# Patient Record
Sex: Female | Born: 1990
Health system: Southern US, Community
[De-identification: ages and names within clinical notes are randomized; demographics above are authoritative.]

## PROBLEM LIST (undated history)

## (undated) ENCOUNTER — Inpatient Hospital Stay (HOSPITAL_COMMUNITY): Payer: Self-pay

## (undated) DIAGNOSIS — N200 Calculus of kidney: Secondary | ICD-10-CM

## (undated) DIAGNOSIS — E7800 Pure hypercholesterolemia, unspecified: Secondary | ICD-10-CM

## (undated) DIAGNOSIS — O039 Complete or unspecified spontaneous abortion without complication: Secondary | ICD-10-CM

## (undated) HISTORY — PX: NO PAST SURGERIES: SHX2092

## (undated) HISTORY — DX: Complete or unspecified spontaneous abortion without complication: O03.9

## (undated) SURGERY — Surgical Case
Anesthesia: *Unknown

---
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
## 2012-02-16 ENCOUNTER — Encounter (HOSPITAL_COMMUNITY): Payer: Self-pay | Admitting: *Deleted

## 2012-02-16 ENCOUNTER — Emergency Department (HOSPITAL_COMMUNITY): Payer: Self-pay

## 2012-02-16 ENCOUNTER — Emergency Department (HOSPITAL_COMMUNITY)
Admission: EM | Admit: 2012-02-16 | Discharge: 2012-02-16 | Disposition: A | Discharge status: Home / Self Care | Payer: Self-pay | Attending: Emergency Medicine | Admitting: Emergency Medicine

## 2012-02-16 DIAGNOSIS — E7800 Pure hypercholesterolemia, unspecified: Secondary | ICD-10-CM | POA: Insufficient documentation

## 2012-02-16 DIAGNOSIS — Y92009 Unspecified place in unspecified non-institutional (private) residence as the place of occurrence of the external cause: Secondary | ICD-10-CM | POA: Insufficient documentation

## 2012-02-16 DIAGNOSIS — X500XXA Overexertion from strenuous movement or load, initial encounter: Secondary | ICD-10-CM | POA: Insufficient documentation

## 2012-02-16 DIAGNOSIS — M25476 Effusion, unspecified foot: Secondary | ICD-10-CM | POA: Insufficient documentation

## 2012-02-16 DIAGNOSIS — M25473 Effusion, unspecified ankle: Secondary | ICD-10-CM | POA: Insufficient documentation

## 2012-02-16 DIAGNOSIS — S93409A Sprain of unspecified ligament of unspecified ankle, initial encounter: Secondary | ICD-10-CM | POA: Insufficient documentation

## 2012-02-16 DIAGNOSIS — M25579 Pain in unspecified ankle and joints of unspecified foot: Secondary | ICD-10-CM | POA: Insufficient documentation

## 2012-02-16 MED ORDER — HYDROCODONE-ACETAMINOPHEN 5-325 MG PO TABS: ORAL_TABLET | ORAL | Status: AC

## 2012-02-16 MED ORDER — IBUPROFEN 800 MG PO TABS: 800.0000 mg | ORAL_TABLET | Freq: Three times a day (TID) | ORAL | Status: AC

## 2012-02-16 NOTE — ED Notes (Signed)
Discharge instructions reviewed with pt, questions answered. Pt verbalized understanding.  

## 2012-02-16 NOTE — Discharge Instructions (Signed)
Ankle Sprain An ankle sprain is an injury to the strong, fibrous tissues (ligaments) that hold the bones of your ankle joint together.  CAUSES Ankle sprain usually is caused by a fall or by twisting your ankle. People who participate in sports are more prone to these types of injuries.  SYMPTOMS  Symptoms of ankle sprain include:  Pain in your ankle. The pain may be present at rest or only when you are trying to stand or walk.   Swelling.   Bruising. Bruising may develop immediately or within 1 to 2 days after your injury.   Difficulty standing or walking.  DIAGNOSIS  Your caregiver will ask you details about your injury and perform a physical exam of your ankle to determine if you have an ankle sprain. During the physical exam, your caregiver will press and squeeze specific areas of your foot and ankle. Your caregiver will try to move your ankle in certain ways. An X-ray exam may be done to be sure a bone was not broken or a ligament did not separate from one of the bones in your ankle (avulsion).  TREATMENT  Certain types of braces can help stabilize your ankle. Your caregiver can make a recommendation for this. Your caregiver may recommend the use of medication for pain. If your sprain is severe, your caregiver may refer you to a surgeon who helps to restore function to parts of your skeletal system (orthopedist) or a physical therapist. HOME CARE INSTRUCTIONS  Apply ice to your injury for 1 to 2 days or as directed by your caregiver. Applying ice helps to reduce inflammation and pain.  Put ice in a plastic bag.   Place a towel between your skin and the bag.   Leave the ice on for 15 to 20 minutes at a time, every 2 hours while you are awake.   Take over-the-counter or prescription medicines for pain, discomfort, or fever only as directed by your caregiver.   Keep your injured leg elevated, when possible, to lessen swelling.   If your caregiver recommends crutches, use them as  instructed. Gradually, put weight on the affected ankle. Continue to use crutches or a cane until you can walk without feeling pain in your ankle.   If you have a plaster splint, wear the splint as directed by your caregiver. Do not rest it on anything harder than a pillow the first 24 hours. Do not put weight on it. Do not get it wet. You may take it off to take a shower or bath.   You may have been given an elastic bandage to wear around your ankle to provide support. If the elastic bandage is too tight (you have numbness or tingling in your foot or your foot becomes cold and blue), adjust the bandage to make it comfortable.   If you have an air splint, you may blow more air into it or let air out to make it more comfortable. You may take your splint off at night and before taking a shower or bath.   Wiggle your toes in the splint several times per day if you are able.  SEEK MEDICAL CARE IF:   You have an increase in bruising, swelling, or pain.   Your toes feel cold.   Pain relief is not achieved with medication.  SEEK IMMEDIATE MEDICAL CARE IF: Your toes are numb or blue or you have severe pain. MAKE SURE YOU:   Understand these instructions.   Will watch your condition.     Will get help right away if you are not doing well or get worse.  Document Released: 10/06/2005 Document Revised: 09/25/2011 Document Reviewed: 05/10/2008 [employer] Patient Information 2012 [employer]. 

## 2012-02-16 NOTE — ED Provider Notes (Signed)
History     CSN: [id number]  Arrival date & time 02/16/12  1641   First MD Initiated Contact with Patient 02/16/12 1759      Chief Complaint  Patient presents with  . Fall    (Consider location/radiation/quality/duration/timing/severity/associated sxs/prior treatment) Patient is a 21 y.o. female presenting with ankle pain. The history is provided by the patient.  Ankle Pain  The incident occurred 1 to 2 hours ago. The incident occurred at home. The injury mechanism was torsion. The pain is present in the left ankle. The quality of the pain is described as aching and throbbing. The pain is moderate. The pain has been constant since onset. Pertinent negatives include no numbness, no inability to bear weight, no loss of motion, no muscle weakness, no loss of sensation and no tingling. She reports no foreign bodies present. The symptoms are aggravated by activity, bearing weight and palpation. She has tried nothing for the symptoms. The treatment provided no relief.    Past Medical History  Diagnosis Date  . High cholesterol     History reviewed. No pertinent past surgical history.  History reviewed. No pertinent family history.  History  Substance Use Topics  . Smoking status: Never Smoker   . Smokeless tobacco: Not on file  . Alcohol Use: No    OB History    Grav Para Term Preterm Abortions TAB SAB Ect Mult Living                  Review of Systems  Constitutional: Negative for fever and chills.  HENT: Negative for sore throat and trouble swallowing.   Musculoskeletal: Positive for joint swelling and arthralgias. Negative for myalgias and back pain.  Skin: Negative for color change and rash.  Neurological: Negative for tingling, weakness and numbness.  Hematological: Does not bruise/bleed easily.    Allergies  Review of patient's allergies indicates no known allergies.  Home Medications   Current Outpatient Rx  Name Route Sig Dispense Refill  . PRAVASTATIN  SODIUM 80 MG PO TABS Oral Take 80 mg by mouth at bedtime.      BP 120/74  Pulse 96  Temp(Src) 98.1 F (36.7 C) (Oral)  Resp 24  Ht 5\' 6"  (1.676 m)  Wt 180 lb (81.647 kg)  BMI 29.05 kg/m2  SpO2 100%  LMP 01/29/2012  Physical Exam  Nursing note and vitals reviewed. Constitutional: She is oriented to person, place, and time. She appears well-developed and well-nourished. No distress.  HENT:  Head: Normocephalic and atraumatic.  Cardiovascular: Normal rate, regular rhythm and normal heart sounds.   Pulmonary/Chest: Effort normal and breath sounds normal. No respiratory distress.  Musculoskeletal: Normal range of motion. She exhibits edema and tenderness.       Left ankle: She exhibits normal range of motion, no swelling, no ecchymosis, no deformity, no laceration and normal pulse. tenderness. Lateral malleolus tenderness found. No head of 5th metatarsal and no proximal fibula tenderness found. Achilles tendon normal.       Feet:  Neurological: She is alert and oriented to person, place, and time. She exhibits normal muscle tone. Coordination normal.  Skin: Skin is warm and dry.    ED Course  Procedures (including critical care time)  Labs Reviewed - No data to display Dg Ankle Complete Left  02/16/2012  *RADIOLOGY REPORT*  Clinical Data: Twisting injury left ankle.  LEFT ANKLE COMPLETE - 3+ VIEW  Comparison: 12/27/2006  Findings: The plafond and talar dome appear unremarkable.  No malleolar fracture  is observed.  No specific hindfoot or midfoot abnormality noted.  IMPRESSION:  1.  No significant abnormality identified.  Original Report Authenticated By: Dellia Cloud, M.D.     ASO applied by nursing staff.  Crutches also given.  Remains NV intact.    MDM  Previous medical charts, nursing notes and vitals signs from this visit were reviewed by me   All laboratory results and/or imaging results performed on this visit, if applicable, were reviewed by me and discussed  with the patient and/or parent as well as recommendation for follow-up    MEDICATIONS GIVEN IN ED: none  ttp of the lateral left malleolus.  No STS.  DP pulse is brisk, sensation intact.  Pt agrees to f/u with Dr. Romeo Apple if needed      PRESCRIPTIONS GIVEN AT DISCHARGE:  Ibuprofen , norco     Pt stable in ED with no significant deterioration in condition. Pt feels improved after observation and/or treatment in ED. Patient / Family / Caregiver understand and agree with initial ED impression and plan with expectations set for ED visit.  Patient agrees to return to ED for any worsening symptoms          Martie Fulgham L. [place], [place] 02/20/12 1741

## 2012-02-16 NOTE — ED Notes (Signed)
Twisted lt ankle this pm., swelling present

## 2012-02-21 NOTE — ED Provider Notes (Signed)
Medical screening examination/treatment/procedure(s) were performed by non-physician practitioner and as supervising physician I was immediately available for consultation/collaboration.  Nicoletta Dress. Colon Branch, MD 02/21/12 2322

## 2012-05-27 LAB — OB RESULTS CONSOLE RPR: RPR: NONREACTIVE

## 2012-05-27 LAB — OB RESULTS CONSOLE ABO/RH: RH Type: POSITIVE

## 2012-10-19 LAB — OB RESULTS CONSOLE GC/CHLAMYDIA: Gonorrhea: NEGATIVE

## 2012-10-19 LAB — OB RESULTS CONSOLE GBS: GBS: NEGATIVE

## 2012-10-20 NOTE — L&D Delivery Note (Signed)
Delivery Note  Ms. Wesolowski is a 22 year old G49P1001 female at 85 weeks and 6 days gestation by LMP who presented today in active labor.  She was progressed normally to 7.5 cm before being started on Pitocin at 1258.  Her position was changed and she rapidly progressed to complete with a strong urge to push.   At 1:19 PM a viable female was delivered via Vaginal, Spontaneous Delivery (Presentation: Left Occiput Anterior).  APGAR: 9, 9; weight: pending.   Placenta status: Intact, Spontaneous via Tomasa Blase.  Cord: 3 vessels with no complications.    Anesthesia: Epidural  Episiotomy: None Lacerations: 2nd degree;Perineal Suture Repair: 2.0 vicryl Est. Blood Loss (mL): 150  Mom to postpartum.  Baby skin-to-skin with mom and then to nursery-stable.  Raelyn Mora, SNM 10/27/2012, 1:43 PM Supervised by: Sid Falcon, CNM

## 2012-10-27 ENCOUNTER — Inpatient Hospital Stay (HOSPITAL_COMMUNITY): Payer: Medicaid Other | Admitting: Anesthesiology

## 2012-10-27 ENCOUNTER — Encounter (HOSPITAL_COMMUNITY): Payer: Self-pay

## 2012-10-27 ENCOUNTER — Inpatient Hospital Stay (HOSPITAL_COMMUNITY)
Admission: AD | Admit: 2012-10-27 | Discharge: 2012-10-28 | DRG: 775 | Disposition: A | Discharge status: Home / Self Care | Payer: Medicaid Other | Source: Ambulatory Visit | Attending: Obstetrics & Gynecology | Admitting: Obstetrics & Gynecology

## 2012-10-27 ENCOUNTER — Encounter (HOSPITAL_COMMUNITY): Payer: Self-pay | Admitting: Anesthesiology

## 2012-10-27 LAB — TYPE AND SCREEN
ABO/RH(D): A POS
Antibody Screen: NEGATIVE

## 2012-10-27 LAB — CBC
Hemoglobin: 11.1 g/dL — ABNORMAL LOW (ref 12.0–15.0)
MCHC: 33.5 g/dL (ref 30.0–36.0)
RDW: 13.9 % (ref 11.5–15.5)
WBC: 12.2 10*3/uL — ABNORMAL HIGH (ref 4.0–10.5)

## 2012-10-27 LAB — RPR: RPR Ser Ql: NONREACTIVE

## 2012-10-27 MED ORDER — EPHEDRINE 5 MG/ML INJ
10.0000 mg | INTRAVENOUS | Status: DC | PRN
  Filled 2012-10-27: qty 4

## 2012-10-27 MED ORDER — ONDANSETRON HCL 4 MG PO TABS: 4.0000 mg | ORAL_TABLET | ORAL | Status: DC | PRN

## 2012-10-27 MED ORDER — TERBUTALINE SULFATE 1 MG/ML IJ SOLN: 0.2500 mg | Freq: Once | INTRAMUSCULAR | Status: DC | PRN

## 2012-10-27 MED ORDER — CITRIC ACID-SODIUM CITRATE 334-500 MG/5ML PO SOLN: 30.0000 mL | ORAL | Status: DC | PRN

## 2012-10-27 MED ORDER — LANOLIN HYDROUS EX OINT: TOPICAL_OINTMENT | CUTANEOUS | Status: DC | PRN

## 2012-10-27 MED ORDER — IBUPROFEN 600 MG PO TABS
600.0000 mg | ORAL_TABLET | Freq: Four times a day (QID) | ORAL | Status: DC
  Administered 2012-10-27 – 2012-10-28 (×4): 600 mg via ORAL
  Filled 2012-10-27 (×4): qty 1

## 2012-10-27 MED ORDER — PHENYLEPHRINE 40 MCG/ML (10ML) SYRINGE FOR IV PUSH (FOR BLOOD PRESSURE SUPPORT): 80.0000 ug | PREFILLED_SYRINGE | INTRAVENOUS | Status: DC | PRN

## 2012-10-27 MED ORDER — DIPHENHYDRAMINE HCL 25 MG PO CAPS: 25.0000 mg | ORAL_CAPSULE | Freq: Four times a day (QID) | ORAL | Status: DC | PRN

## 2012-10-27 MED ORDER — OXYTOCIN BOLUS FROM INFUSION: 500.0000 mL | INTRAVENOUS | Status: DC

## 2012-10-27 MED ORDER — OXYCODONE-ACETAMINOPHEN 5-325 MG PO TABS: 1.0000 | ORAL_TABLET | ORAL | Status: DC | PRN

## 2012-10-27 MED ORDER — ONDANSETRON HCL 4 MG/2ML IJ SOLN: 4.0000 mg | Freq: Four times a day (QID) | INTRAMUSCULAR | Status: DC | PRN

## 2012-10-27 MED ORDER — SENNOSIDES-DOCUSATE SODIUM 8.6-50 MG PO TABS
2.0000 | ORAL_TABLET | Freq: Every day | ORAL | Status: DC
  Administered 2012-10-27: 2 via ORAL

## 2012-10-27 MED ORDER — LACTATED RINGERS IV SOLN: 500.0000 mL | INTRAVENOUS | Status: DC | PRN

## 2012-10-27 MED ORDER — ONDANSETRON HCL 4 MG/2ML IJ SOLN: 4.0000 mg | INTRAMUSCULAR | Status: DC | PRN

## 2012-10-27 MED ORDER — FENTANYL 2.5 MCG/ML BUPIVACAINE 1/10 % EPIDURAL INFUSION (WH - ANES)
14.0000 mL/h | INTRAMUSCULAR | Status: DC
  Administered 2012-10-27: 14 mL/h via EPIDURAL
  Filled 2012-10-27: qty 125

## 2012-10-27 MED ORDER — LACTATED RINGERS IV SOLN
INTRAVENOUS | Status: DC
  Administered 2012-10-27: 125 mL/h via INTRAVENOUS

## 2012-10-27 MED ORDER — LIDOCAINE HCL (PF) 1 % IJ SOLN
INTRAMUSCULAR | Status: DC | PRN
  Administered 2012-10-27 (×2): 5 mL

## 2012-10-27 MED ORDER — WITCH HAZEL-GLYCERIN EX PADS: 1.0000 "application " | MEDICATED_PAD | CUTANEOUS | Status: DC | PRN

## 2012-10-27 MED ORDER — EPHEDRINE 5 MG/ML INJ: 10.0000 mg | INTRAVENOUS | Status: DC | PRN

## 2012-10-27 MED ORDER — LIDOCAINE HCL (PF) 1 % IJ SOLN
30.0000 mL | INTRAMUSCULAR | Status: DC | PRN
  Filled 2012-10-27: qty 30

## 2012-10-27 MED ORDER — BENZOCAINE-MENTHOL 20-0.5 % EX AERO
1.0000 "application " | INHALATION_SPRAY | CUTANEOUS | Status: DC | PRN
  Administered 2012-10-27: 1 via TOPICAL
  Filled 2012-10-27: qty 56

## 2012-10-27 MED ORDER — ACETAMINOPHEN 325 MG PO TABS: 650.0000 mg | ORAL_TABLET | ORAL | Status: DC | PRN

## 2012-10-27 MED ORDER — TETANUS-DIPHTH-ACELL PERTUSSIS 5-2.5-18.5 LF-MCG/0.5 IM SUSP: 0.5000 mL | Freq: Once | INTRAMUSCULAR | Status: DC

## 2012-10-27 MED ORDER — ZOLPIDEM TARTRATE 5 MG PO TABS: 5.0000 mg | ORAL_TABLET | Freq: Every evening | ORAL | Status: DC | PRN

## 2012-10-27 MED ORDER — SIMETHICONE 80 MG PO CHEW: 80.0000 mg | CHEWABLE_TABLET | ORAL | Status: DC | PRN

## 2012-10-27 MED ORDER — OXYTOCIN 40 UNITS IN LACTATED RINGERS INFUSION - SIMPLE MED
1.0000 m[IU]/min | INTRAVENOUS | Status: DC
  Administered 2012-10-27: 2 m[IU]/min via INTRAVENOUS

## 2012-10-27 MED ORDER — DIBUCAINE 1 % RE OINT: 1.0000 "application " | TOPICAL_OINTMENT | RECTAL | Status: DC | PRN

## 2012-10-27 MED ORDER — LACTATED RINGERS IV SOLN
500.0000 mL | Freq: Once | INTRAVENOUS | Status: AC
  Administered 2012-10-27: 500 mL via INTRAVENOUS

## 2012-10-27 MED ORDER — IBUPROFEN 600 MG PO TABS: 600.0000 mg | ORAL_TABLET | Freq: Four times a day (QID) | ORAL | Status: DC | PRN

## 2012-10-27 MED ORDER — OXYTOCIN 40 UNITS IN LACTATED RINGERS INFUSION - SIMPLE MED
62.5000 mL/h | INTRAVENOUS | Status: DC
  Administered 2012-10-27: 62.5 mL/h via INTRAVENOUS
  Filled 2012-10-27: qty 1000

## 2012-10-27 MED ORDER — PHENYLEPHRINE 40 MCG/ML (10ML) SYRINGE FOR IV PUSH (FOR BLOOD PRESSURE SUPPORT)
80.0000 ug | PREFILLED_SYRINGE | INTRAVENOUS | Status: DC | PRN
  Filled 2012-10-27: qty 5

## 2012-10-27 MED ORDER — DIPHENHYDRAMINE HCL 50 MG/ML IJ SOLN: 12.5000 mg | INTRAMUSCULAR | Status: DC | PRN

## 2012-10-27 MED ORDER — PRENATAL MULTIVITAMIN CH
1.0000 | ORAL_TABLET | Freq: Every day | ORAL | Status: DC
  Administered 2012-10-27: 1 via ORAL
  Filled 2012-10-27 (×2): qty 1

## 2012-10-27 NOTE — MAU Note (Signed)
Pt states contractions started around 9pm and that they have progressively gotten stronger. States that they are now every 2-3 minutes apart

## 2012-10-27 NOTE — Progress Notes (Signed)
   Subjective: Pt is comfortable after epidural.  No questions or concerns.  Consents to AROM.  Objective: BP 104/73  Pulse 119  Temp 97.6 F (36.4 C) (Oral)  Resp 20  Ht 5\' 4"  (1.626 m)  Wt 90.266 kg (199 lb)  BMI 34.16 kg/m2  SpO2 97%  LMP 01/29/2012      FHT:  FHR: 155 bpm, variability: moderate,  accelerations:  Present,  decelerations:  Present early decels. UC:   regular, every 2-3 minutes SVE:   Dilation: 7 Effacement (%): 100 Station: 0 Exam by:: Maycee Blasco,CNM  Labs: Lab Results  Component Value Date   WBC 12.2* 10/27/2012   HGB 11.1* 10/27/2012   HCT 33.1* 10/27/2012   MCV 83.4 10/27/2012   PLT 240 10/27/2012    Assessment / Plan: Spontaneous labor, progressing normally  Labor: Progressing normally Preeclampsia:  n/a Fetal Wellbeing:  Category I Pain Control:  Epidural I/D:  n/a Anticipated MOD:  NSVD  [facility] 10/27/2012, 10:50 AM

## 2012-10-27 NOTE — Anesthesia Procedure Notes (Signed)
Epidural Patient location during procedure: OB Start time: 10/27/2012 8:59 AM  Staffing Anesthesiologist: Angus Seller., Harrell Gave. Performed by: anesthesiologist   Preanesthetic Checklist Completed: patient identified, site marked, surgical consent, pre-op evaluation, timeout performed, IV checked, risks and benefits discussed and monitors and equipment checked  Epidural Patient position: sitting Prep: site prepped and draped and DuraPrep Patient monitoring: continuous pulse ox and blood pressure Approach: midline Injection technique: LOR air and LOR saline  Needle:  Needle type: Tuohy  Needle gauge: 17 G Needle length: 9 cm and 9 Needle insertion depth: 5 cm cm Catheter type: closed end flexible Catheter size: 19 Gauge Catheter at skin depth: 10 cm Test dose: negative  Assessment Events: blood not aspirated, injection not painful, no injection resistance, negative IV test and no paresthesia  Additional Notes Patient identified.  Risk benefits discussed including failed block, incomplete pain control, headache, nerve damage, paralysis, blood pressure changes, nausea, vomiting, reactions to medication both toxic or allergic, and postpartum back pain.  Patient expressed understanding and wished to proceed.  All questions were answered.  Sterile technique used throughout procedure and epidural site dressed with sterile barrier dressing. No paresthesia or other complications noted.The patient did not experience any signs of intravascular injection such as tinnitus or metallic taste in mouth nor signs of intrathecal spread such as rapid motor block. Please see nursing notes for vital signs.

## 2012-10-27 NOTE — H&P (Signed)
Attestation of Attending Supervision of Advanced Practitioner (CNM/NP): Evaluation and management procedures were performed by the Advanced Practitioner under my supervision and collaboration.  I have reviewed the Advanced Practitioner's note and chart, and I agree with the management and plan.  Ellionna Buckbee 10/27/2012 12:58 PM

## 2012-10-27 NOTE — Anesthesia Preprocedure Evaluation (Signed)

## 2012-10-27 NOTE — H&P (Signed)
Jessica Ward is a 22 y.o. female presenting for contractions. Maternal Medical History:  Reason for admission: Reason for admission: contractions.  Contractions: Onset was 6-12 hours ago.   Frequency: regular.    Fetal activity: Perceived fetal activity is normal.   Last perceived fetal movement was within the past hour.    Prenatal complications: no prenatal complications   OB History    Grav Para Term Preterm Abortions TAB SAB Ect Mult Living   2 1 1       1      Past Medical History  Diagnosis Date  . High cholesterol    Past Surgical History  Procedure Date  . No past surgeries    Family History: family history is not on file. Social History:  reports that she has never smoked. She does not have any smokeless tobacco history on file. She reports that she does not drink alcohol or use illicit drugs.   Prenatal Transfer Tool  Maternal Diabetes: No Genetic Screening: Declined Maternal Ultrasounds/Referrals: Normal Fetal Ultrasounds or other Referrals:  None Maternal Substance Abuse:  No Significant Maternal Medications:  None Significant Maternal Lab Results:  Lab values include: Group B Strep negative Other Comments:  None  Review of Systems  Gastrointestinal: Positive for abdominal pain.  All other systems reviewed and are negative.    Dilation: 7 Effacement (%): 100 Station: 0 Exam by:: walidah,CNM Blood pressure 115/55, pulse 104, temperature 97.6 F (36.4 C), temperature source Oral, resp. rate 20, height 5\' 4"  (1.626 m), weight 90.266 kg (199 lb), last menstrual period 01/29/2012, SpO2 97.00%. Maternal Exam:  Uterine Assessment: Contraction strength is firm.  Contraction frequency is irregular.   Abdomen: Estimated fetal weight is 7.5-8lbs.   Fetal presentation: vertex  Introitus: Normal vulva. Normal vagina.  Vaginal discharge: mucusy.    Fetal Exam Fetal Monitor Review: Baseline rate: 155.  Variability: moderate (6-25 bpm).   Pattern:  accelerations present and early decelerations.    Fetal State Assessment: Category I - tracings are normal.     Physical Exam  Constitutional: She is oriented to person, place, and time. She appears well-developed and well-nourished. No distress.  HENT:  Head: Normocephalic.  Neck: Normal range of motion. Neck supple.  Cardiovascular: Normal rate, regular rhythm and normal heart sounds.   Respiratory: Effort normal and breath sounds normal.  GI: Soft. There is no tenderness.  Genitourinary: No bleeding around the vagina. Vaginal discharge: mucusy.  Musculoskeletal: Normal range of motion. She exhibits edema (trace pedal bilat).  Neurological: She is alert and oriented to person, place, and time.  Skin: Skin is warm and dry.    Prenatal labs: ABO, Rh: A/Positive/-- (08/08 0000) Antibody: Negative (08/08 0000) Rubella: Immune (08/08 0000) RPR: Nonreactive (08/08 0000)  HBsAg: Negative (08/08 0000)  HIV: Non-reactive (08/08 0000)  GBS: Negative (12/31 0000)   Assessment/Plan: Active Labor GBS neg   Admit to [facility] Suit[facility] AROM > clear Anticipate NSVD  [facility] 10/27/2012, 10:46 AM

## 2012-10-28 LAB — CBC
HCT: 29.1 % — ABNORMAL LOW (ref 36.0–46.0)
Hemoglobin: 9.6 g/dL — ABNORMAL LOW (ref 12.0–15.0)
MCH: 27.8 pg (ref 26.0–34.0)
MCV: 84.3 fL (ref 78.0–100.0)
RBC: 3.45 MIL/uL — ABNORMAL LOW (ref 3.87–5.11)
WBC: 8.8 10*3/uL (ref 4.0–10.5)

## 2012-10-28 MED ORDER — IBUPROFEN 600 MG PO TABS: 600.0000 mg | ORAL_TABLET | Freq: Four times a day (QID) | ORAL | Status: DC

## 2012-10-28 NOTE — Progress Notes (Signed)
UR chart review completed.  

## 2012-10-28 NOTE — Anesthesia Postprocedure Evaluation (Signed)
  Anesthesia Post-op Note  Patient: Jessica Ward  Procedure(s) Performed: * No procedures listed *  Patient Location: Mother/Baby  Anesthesia Type:Epidural  Level of Consciousness: awake, alert  and oriented  Airway and Oxygen Therapy: Patient Spontanous Breathing  Post-op Pain: none  Post-op Assessment: Post-op Vital signs reviewed and Patient's Cardiovascular Status Stable  Post-op Vital Signs: Reviewed and stable  Complications: No apparent anesthesia complications

## 2012-10-28 NOTE — Discharge Summary (Signed)
Obstetric Discharge Summary Reason for Admission: onset of labor Prenatal Procedures: ultrasound Intrapartum Procedures: spontaneous vaginal delivery Postpartum Procedures: none Complications-Operative and Postpartum: 2nd degree perineal laceration Hemoglobin  Date Value Range Status  10/28/2012 9.6* 12.0 - 15.0 g/dL Final     HCT  Date Value Range Status  10/28/2012 29.1* 36.0 - 46.0 % Final    Physical Exam:  General: alert, cooperative and no distress Lochia: appropriate Uterine Fundus: firm No hematoma at perineum DVT Evaluation: No evidence of DVT seen on physical exam. No cords or calf tenderness. No significant calf/ankle edema.  Discharge Diagnoses: Term Pregnancy-delivered  Discharge Information: Date: 10/28/2012 Activity: pelvic rest Diet: routine Medications: Ibuprofen Condition: stable Instructions: refer to practice specific booklet Discharge to: home   Newborn Data: Live born female  Birth Weight: 7 lb 0.4 oz (3185 g) APGAR: 9, 9  Home with mother.  Jessica Ward 10/28/2012, 8:43 AM

## 2012-10-28 NOTE — Discharge Summary (Signed)
Agree with above note.  Jessica Carranco H. 10/28/2012 4:48 PM

## 2013-01-19 ENCOUNTER — Encounter: Payer: Self-pay | Admitting: *Deleted

## 2013-01-20 ENCOUNTER — Ambulatory Visit: Payer: Self-pay | Admitting: Advanced Practice Midwife

## 2013-09-29 ENCOUNTER — Ambulatory Visit (INDEPENDENT_AMBULATORY_CARE_PROVIDER_SITE_OTHER): Payer: Self-pay | Admitting: Obstetrics & Gynecology

## 2013-09-29 ENCOUNTER — Encounter: Payer: Self-pay | Admitting: Obstetrics & Gynecology

## 2013-09-29 ENCOUNTER — Encounter (INDEPENDENT_AMBULATORY_CARE_PROVIDER_SITE_OTHER): Payer: Self-pay

## 2013-09-29 VITALS — BP 110/78 | Ht 66.0 in | Wt 203.0 lb

## 2013-09-29 DIAGNOSIS — R102 Pelvic and perineal pain: Secondary | ICD-10-CM

## 2013-09-29 DIAGNOSIS — N949 Unspecified condition associated with female genital organs and menstrual cycle: Secondary | ICD-10-CM

## 2013-09-29 MED ORDER — CIPROFLOXACIN HCL 500 MG PO TABS: 500.0000 mg | ORAL_TABLET | Freq: Two times a day (BID) | ORAL | Status: DC

## 2013-09-29 NOTE — Progress Notes (Signed)
Patient ID: Jessica Ward, female   DOB: 1991/06/29, 22 y.o.   MRN: [id number] Pt with cyclical not related to menses pelvic cramps Severe makes nauseated Pain with sex the next day  Exam No cmt Uterus not tender adnexa normal  Imp cylcical pelvic pain related to iud  Plan Doxycycline too expensive cipro x 2 weeks  folllow up if needed

## 2013-10-25 ENCOUNTER — Ambulatory Visit: Payer: Self-pay | Admitting: Obstetrics & Gynecology

## 2013-11-02 ENCOUNTER — Ambulatory Visit: Payer: Self-pay | Admitting: Obstetrics & Gynecology

## 2013-12-07 ENCOUNTER — Ambulatory Visit: Payer: BC Managed Care – PPO | Admitting: Advanced Practice Midwife

## 2013-12-14 ENCOUNTER — Ambulatory Visit: Payer: BC Managed Care – PPO | Admitting: Adult Health

## 2014-02-03 ENCOUNTER — Encounter: Payer: Self-pay | Admitting: *Deleted

## 2014-02-03 ENCOUNTER — Ambulatory Visit: Payer: BC Managed Care – PPO | Admitting: Adult Health

## 2014-08-21 ENCOUNTER — Encounter: Payer: Self-pay | Admitting: Obstetrics & Gynecology

## 2014-08-22 ENCOUNTER — Encounter: Payer: Self-pay | Admitting: Advanced Practice Midwife

## 2014-08-22 ENCOUNTER — Ambulatory Visit (INDEPENDENT_AMBULATORY_CARE_PROVIDER_SITE_OTHER): Payer: BC Managed Care – PPO | Admitting: Advanced Practice Midwife

## 2014-08-22 VITALS — BP 108/60 | Ht 66.0 in | Wt 208.0 lb

## 2014-08-22 DIAGNOSIS — Z3009 Encounter for other general counseling and advice on contraception: Secondary | ICD-10-CM | POA: Insufficient documentation

## 2014-08-22 DIAGNOSIS — Z3200 Encounter for pregnancy test, result unknown: Secondary | ICD-10-CM

## 2014-08-22 DIAGNOSIS — Z30432 Encounter for removal of intrauterine contraceptive device: Secondary | ICD-10-CM

## 2014-08-22 DIAGNOSIS — Z3202 Encounter for pregnancy test, result negative: Secondary | ICD-10-CM

## 2014-08-22 LAB — POCT URINE PREGNANCY: PREG TEST UR: NEGATIVE

## 2014-08-22 MED ORDER — NORGESTIMATE-ETH ESTRADIOL 0.25-35 MG-MCG PO TABS: 1.0000 | ORAL_TABLET | Freq: Every day | ORAL | Status: DC

## 2014-08-22 NOTE — Progress Notes (Signed)
  Jessica Ward 23 y.o.  Filed Vitals:   08/22/14 0952  BP: 108/60   Past Medical History  Diagnosis Date  . High cholesterol   . Normal delivery 10/27/2012   Past Surgical History  Procedure Laterality Date  . No past surgeries     family history includes Cancer in her other; Coronary artery disease in her maternal grandfather; Diabetes in her maternal grandfather; Hypertension in her mother. Current outpatient prescriptions: ciprofloxacin (CIPRO) 500 MG tablet, Take 1 tablet (500 mg total) by mouth 2 (two) times daily., Disp: 28 tablet, Rfl: 0;  norgestimate-ethinyl estradiol (ORTHO-CYCLEN,SPRINTEC,PREVIFEM) 0.25-35 MG-MCG tablet, Take 1 tablet by mouth daily., Disp: 1 Package, Rfl: 11;  pantoprazole (PROTONIX) 20 MG tablet, Take 20 mg by mouth daily., Disp: , Rfl:  pravastatin (PRAVACHOL) 80 MG tablet, Take 80 mg by mouth at bedtime., Disp: , Rfl:     Here for IUD removal.  She had the Mirena IUD placed 3 years ago and would like it removed because she feels like it gives her infections, sometimes painful. Her plans for future contraception are COC.  A graves speculum was placed, and the strings were not visible.  They were blindly grasped with a curved Tresa EndoKelly and the IUD easily removed.  Pt given IUD removal f/u instructions.

## 2014-08-22 NOTE — Patient Instructions (Signed)

## 2014-09-19 ENCOUNTER — Telehealth: Payer: Self-pay | Admitting: Adult Health

## 2014-09-19 NOTE — Telephone Encounter (Signed)
Mom called pt can't afford OCs, give me a call can get some samples, she and husband split up

## 2014-10-20 NOTE — L&D Delivery Note (Signed)
Delivery Note Charlsie Merlesaylor Ziff is a 24 y.o. female G3P2002 with an uncomplicated IUP at 4560w0d who presented on 08/14/2015 in latent labor. She was admitted to the Labor & Delivery unit. An epidural was placed at the patient's request. She progressed to active labor without issue with augmentation by pitocin. At 1553 she delivered a healthy baby boy via uncomplicated spontaneous vaginal delivery (LOA). Cord clamping was delayed and third stage of labor proceeded as expected with spontaneous delivery of the placenta and subsequent hemostasis.  APGAR (1 MIN): 9   APGAR (5 MINS): 9   Weight: 7 lb 9.3 oz Placenta status: SROM Cord: 3 vessel Complications: none  Anesthesia: Epidural  Episiotomy: None Lacerations: 1st degree perineal, midline Suture Repair: 3.0 vicryl Est. Blood Loss (mL): 148 mL   Mom to postpartum.  Baby to Couplet care / Skin to Skin.  Gerri Sporeaitlin Clennon Nasca, MD 08/14/2015 4:46 PM

## 2014-12-22 ENCOUNTER — Encounter: Payer: Self-pay | Admitting: Adult Health

## 2014-12-22 ENCOUNTER — Ambulatory Visit (INDEPENDENT_AMBULATORY_CARE_PROVIDER_SITE_OTHER): Payer: BLUE CROSS/BLUE SHIELD | Admitting: Adult Health

## 2014-12-22 VITALS — BP 104/60 | HR 102 | Ht 66.0 in | Wt 206.5 lb

## 2014-12-22 DIAGNOSIS — Z3201 Encounter for pregnancy test, result positive: Secondary | ICD-10-CM

## 2014-12-22 DIAGNOSIS — Z3490 Encounter for supervision of normal pregnancy, unspecified, unspecified trimester: Secondary | ICD-10-CM

## 2014-12-22 LAB — POCT URINE PREGNANCY: Preg Test, Ur: POSITIVE

## 2014-12-22 NOTE — Progress Notes (Signed)
Subjective:     Patient ID: Jessica Ward, female   DOB: 07-25-1991, 24 y.o.   MRN: [id number]  HPI Jessica Ward is a 24 year old white female, she had IUD removed in November and missed a period this months and had 2+HPT and wants UPT now.No bleeding or nausea or vomiting.  Review of Systems +missed period with +UPT, all other systems negative Reviewed past medical,surgical, social and family history. Reviewed medications and allergies.     Objective:   Physical Exam BP 104/60 mmHg  Pulse 102  Ht 5\' 6"  (1.676 m)  Wt 206 lb 8 oz (93.668 kg)  BMI 33.35 kg/m2  LMP 11/21/2013 UPT+, about 4+2 weeks with EDD 08/30/15 by LMP, medicaid form given, she says she wants tubes tied.Skin warm and dry, abdomen soft, non tender, no HSM.    Assessment:     Pregnant +UPT    Plan:     Take OTC prenatals or flintstones Return 3/24 for dating US Review handout on first trimester

## 2014-12-22 NOTE — Patient Instructions (Signed)
First Trimester of Pregnancy The first trimester of pregnancy is from week 1 until the end of week 12 (months 1 through 3). A week after a sperm fertilizes an egg, the egg will implant on the wall of the uterus. This embryo will begin to develop into a baby. Genes from you and your partner are forming the baby. The female genes determine whether the baby is a boy or a girl. At 6-8 weeks, the eyes and face are formed, and the heartbeat can be seen on ultrasound. At the end of 12 weeks, all the baby's organs are formed.  Now that you are pregnant, you will want to do everything you can to have a healthy baby. Two of the most important things are to get good prenatal care and to follow your health care provider's instructions. Prenatal care is all the medical care you receive before the baby's birth. This care will help prevent, find, and treat any problems during the pregnancy and childbirth. BODY CHANGES Your body goes through many changes during pregnancy. The changes vary from woman to woman.   You may gain or lose a couple of pounds at first.  You may feel sick to your stomach (nauseous) and throw up (vomit). If the vomiting is uncontrollable, call your health care provider.  You may tire easily.  You may develop headaches that can be relieved by medicines approved by your health care provider.  You may urinate more often. Painful urination may mean you have a bladder infection.  You may develop heartburn as a result of your pregnancy.  You may develop constipation because certain hormones are causing the muscles that push waste through your intestines to slow down.  You may develop hemorrhoids or swollen, bulging veins (varicose veins).  Your breasts may begin to grow larger and become tender. Your nipples may stick out more, and the tissue that surrounds them (areola) may become darker.  Your gums may bleed and may be sensitive to brushing and flossing.  Dark spots or blotches (chloasma,  mask of pregnancy) may develop on your face. This will likely fade after the baby is born.  Your menstrual periods will stop.  You may have a loss of appetite.  You may develop cravings for certain kinds of food.  You may have changes in your emotions from day to day, such as being excited to be pregnant or being concerned that something may go wrong with the pregnancy and baby.  You may have more vivid and strange dreams.  You may have changes in your hair. These can include thickening of your hair, rapid growth, and changes in texture. Some women also have hair loss during or after pregnancy, or hair that feels dry or thin. Your hair will most likely return to normal after your baby is born. WHAT TO EXPECT AT YOUR PRENATAL VISITS During a routine prenatal visit:  You will be weighed to make sure you and the baby are growing normally.  Your blood pressure will be taken.  Your abdomen will be measured to track your baby's growth.  The fetal heartbeat will be listened to starting around week 10 or 12 of your pregnancy.  Test results from any previous visits will be discussed. Your health care provider may ask you:  How you are feeling.  If you are feeling the baby move.  If you have had any abnormal symptoms, such as leaking fluid, bleeding, severe headaches, or abdominal cramping.  If you have any questions. Other tests   that may be performed during your first trimester include:  Blood tests to find your blood type and to check for the presence of any previous infections. They will also be used to check for low iron levels (anemia) and Rh antibodies. Later in the pregnancy, blood tests for diabetes will be done along with other tests if problems develop.  Urine tests to check for infections, diabetes, or protein in the urine.  An ultrasound to confirm the proper growth and development of the baby.  An amniocentesis to check for possible genetic problems.  Fetal screens for  spina bifida and Down syndrome.  You may need other tests to make sure you and the baby are doing well. HOME CARE INSTRUCTIONS  Medicines  Follow your health care provider's instructions regarding medicine use. Specific medicines may be either safe or unsafe to take during pregnancy.  Take your prenatal vitamins as directed.  If you develop constipation, try taking a stool softener if your health care provider approves. Diet  Eat regular, well-balanced meals. Choose a variety of foods, such as meat or vegetable-based protein, fish, milk and low-fat dairy products, vegetables, fruits, and whole grain breads and cereals. Your health care provider will help you determine the amount of weight gain that is right for you.  Avoid raw meat and uncooked cheese. These carry germs that can cause birth defects in the baby.  Eating four or five small meals rather than three large meals a day may help relieve nausea and vomiting. If you start to feel nauseous, eating a few soda crackers can be helpful. Drinking liquids between meals instead of during meals also seems to help nausea and vomiting.  If you develop constipation, eat more high-fiber foods, such as fresh vegetables or fruit and whole grains. Drink enough fluids to keep your urine clear or pale yellow. Activity and Exercise  Exercise only as directed by your health care provider. Exercising will help you:  Control your weight.  Stay in shape.  Be prepared for labor and delivery.  Experiencing pain or cramping in the lower abdomen or low back is a good sign that you should stop exercising. Check with your health care provider before continuing normal exercises.  Try to avoid standing for long periods of time. Move your legs often if you must stand in one place for a long time.  Avoid heavy lifting.  Wear low-heeled shoes, and practice good posture.  You may continue to have sex unless your health care provider directs you  otherwise. Relief of Pain or Discomfort  Wear a good support bra for breast tenderness.   Take warm sitz baths to soothe any pain or discomfort caused by hemorrhoids. Use hemorrhoid cream if your health care provider approves.   Rest with your legs elevated if you have leg cramps or low back pain.  If you develop varicose veins in your legs, wear support hose. Elevate your feet for 15 minutes, 3-4 times a day. Limit salt in your diet. Prenatal Care  Schedule your prenatal visits by the twelfth week of pregnancy. They are usually scheduled monthly at first, then more often in the last 2 months before delivery.  Write down your questions. Take them to your prenatal visits.  Keep all your prenatal visits as directed by your health care provider. Safety  Wear your seat belt at all times when driving.  Make a list of emergency phone numbers, including numbers for family, friends, the hospital, and police and fire departments. General Tips    Ask your health care provider for a referral to a local prenatal education class. Begin classes no later than at the beginning of month 6 of your pregnancy.  Ask for help if you have counseling or nutritional needs during pregnancy. Your health care provider can offer advice or refer you to specialists for help with various needs.  Do not use hot tubs, steam rooms, or saunas.  Do not douche or use tampons or scented sanitary pads.  Do not cross your legs for long periods of time.  Avoid cat litter boxes and soil used by cats. These carry germs that can cause birth defects in the baby and possibly loss of the fetus by miscarriage or stillbirth.  Avoid all smoking, herbs, alcohol, and medicines not prescribed by your health care provider. Chemicals in these affect the formation and growth of the baby.  Schedule a dentist appointment. At home, brush your teeth with a soft toothbrush and be gentle when you floss. SEEK MEDICAL CARE IF:   You have  dizziness.  You have mild pelvic cramps, pelvic pressure, or nagging pain in the abdominal area.  You have persistent nausea, vomiting, or diarrhea.  You have a bad smelling vaginal discharge.  You have pain with urination.  You notice increased swelling in your face, hands, legs, or ankles. SEEK IMMEDIATE MEDICAL CARE IF:   You have a fever.  You are leaking fluid from your vagina.  You have spotting or bleeding from your vagina.  You have severe abdominal cramping or pain.  You have rapid weight gain or loss.  You vomit blood or material that looks like coffee grounds.  You are exposed to [place] measles and have never had them.  You are exposed to fifth disease or chickenpox.  You develop a severe headache.  You have shortness of breath.  You have any kind of trauma, such as from a fall or a car accident. Document Released: 09/30/2001 Document Revised: 02/20/2014 Document Reviewed: 08/16/2013 [facility] Patient Information 2015 [employer] [place]. This information is not intended to replace advice given to you by your health care provider. Make sure you discuss any questions you have with your health care provider. Return 3/24 for dating [place]

## 2015-01-01 ENCOUNTER — Telehealth: Payer: Self-pay | Admitting: *Deleted

## 2015-01-01 NOTE — Telephone Encounter (Signed)
Pt c/o of headaches and migraine causing nausea/vomiting. Pt states tylenol is not helping. Call transferred to front staff for soonest available appt.

## 2015-01-02 ENCOUNTER — Encounter: Payer: Self-pay | Admitting: Obstetrics & Gynecology

## 2015-01-02 ENCOUNTER — Encounter: Payer: Medicaid Other | Admitting: Obstetrics & Gynecology

## 2015-01-02 ENCOUNTER — Ambulatory Visit (INDEPENDENT_AMBULATORY_CARE_PROVIDER_SITE_OTHER): Payer: BLUE CROSS/BLUE SHIELD | Admitting: Obstetrics & Gynecology

## 2015-01-02 VITALS — BP 120/80 | HR 88 | Wt 202.0 lb

## 2015-01-02 DIAGNOSIS — M542 Cervicalgia: Secondary | ICD-10-CM | POA: Diagnosis not present

## 2015-01-02 DIAGNOSIS — G44209 Tension-type headache, unspecified, not intractable: Secondary | ICD-10-CM

## 2015-01-02 NOTE — Progress Notes (Signed)
This encounter was created in error - please disregard.

## 2015-01-02 NOTE — Progress Notes (Signed)
Patient ID: Jessica Ward, female   DOB: 02-Jan-1991, 24 y.o.   MRN: [id number] Pt has been having severe bilateral top of head headaches with some radiation down neck and into shoulders Has had 1 episode of unilateral with nausea, but mostly top of head type  Blood pressure 120/80, pulse 88, weight 202 lb (91.627 kg), last menstrual period 11/21/2013.  Exam +triggers in neck bilaterally r>L involving lateral trapezius as well When the triggers are found the pain radiates into the head in distribution of the headached  Impression Myofascial neck pain, trigger points causing headache Early pregnancy unconfirmed status, appointment pending  Plan Injection of trigger points  Trigger Point Injection   Pre-operative diagnosis: myofascial pain of trapezius bilateral and headache  Post-operative diagnosis: same  After risks and benefits were explained including bleeding, infection, worsening of the pain, damage to the area being injected, weakness, allergic reaction to medications, vascular injection, and nerve damage, signed consent was obtained.  All questions were answered.    The area of the trigger point was identified and the skin prepped three times with alcohol and the alcohol allowed to dry.  Next, a 25 gauge 0.5 inch needle was placed in the area of the trigger point.  Once reproduction of the pain was elicited and negative aspiration confirmed, the trigger point was injected and the needle removed.    The patient did tolerate the procedure well and there were not complications.    Medication used:  20 cc 0.5% marcaine was injected total into about 14 spots bilaterally    Trigger points injected: 14    Trigger point(s) location(s):  bilateral   Follow up 1 week

## 2015-01-09 ENCOUNTER — Ambulatory Visit: Payer: Medicaid Other | Admitting: Obstetrics & Gynecology

## 2015-01-10 ENCOUNTER — Ambulatory Visit (INDEPENDENT_AMBULATORY_CARE_PROVIDER_SITE_OTHER): Payer: BLUE CROSS/BLUE SHIELD

## 2015-01-10 DIAGNOSIS — O3680X0 Pregnancy with inconclusive fetal viability, not applicable or unspecified: Secondary | ICD-10-CM

## 2015-01-10 DIAGNOSIS — Z3490 Encounter for supervision of normal pregnancy, unspecified, unspecified trimester: Secondary | ICD-10-CM

## 2015-01-10 NOTE — Progress Notes (Signed)
US 4746w1d EDD 08/21/2015, IUP pos fht 171 bpm,bilat adnexa wnl,cx appears closed

## 2015-01-12 ENCOUNTER — Other Ambulatory Visit: Payer: Self-pay

## 2015-01-30 ENCOUNTER — Other Ambulatory Visit: Payer: Self-pay | Admitting: Advanced Practice Midwife

## 2015-01-30 ENCOUNTER — Telehealth: Payer: Self-pay | Admitting: Advanced Practice Midwife

## 2015-01-30 ENCOUNTER — Other Ambulatory Visit: Payer: Self-pay | Admitting: *Deleted

## 2015-01-30 MED ORDER — DOXYLAMINE-PYRIDOXINE 10-10 MG PO TBEC: DELAYED_RELEASE_TABLET | ORAL | Status: DC

## 2015-01-30 NOTE — Telephone Encounter (Signed)
Yes give her 2 samples and I called it in to pharmacy

## 2015-01-30 NOTE — Progress Notes (Unsigned)
Diclegis samples given and rx Alysia Penna/prior auth sent to pharmacy.

## 2015-01-30 NOTE — Telephone Encounter (Signed)
Pt informed she can come pick up samples from front staff and that RX was sent to her pharmacy.

## 2015-01-30 NOTE — Telephone Encounter (Signed)
Jessica Ward, this pt states she talked to you yesterday on the phone about her N/V and you said you would give her some samples for the nausea.  She is 11w and has her new OB appointment tomorrow with Jessica BattenKim but she states you told her to start the medication ASAP.  I do not see any notes in the chart where you have talked to her so I just want to make sure it is OK to give her some samples of Diclegis.

## 2015-01-31 ENCOUNTER — Ambulatory Visit (INDEPENDENT_AMBULATORY_CARE_PROVIDER_SITE_OTHER): Payer: Medicaid Other | Admitting: Women's Health

## 2015-01-31 ENCOUNTER — Encounter: Payer: Self-pay | Admitting: Women's Health

## 2015-01-31 ENCOUNTER — Other Ambulatory Visit (HOSPITAL_COMMUNITY)
Admission: RE | Admit: 2015-01-31 | Discharge: 2015-01-31 | Disposition: A | Discharge status: Home / Self Care | Payer: Medicaid Other | Source: Ambulatory Visit | Attending: Obstetrics & Gynecology | Admitting: Obstetrics & Gynecology

## 2015-01-31 VITALS — BP 118/72 | HR 68 | Wt 195.0 lb

## 2015-01-31 DIAGNOSIS — Z113 Encounter for screening for infections with a predominantly sexual mode of transmission: Secondary | ICD-10-CM | POA: Insufficient documentation

## 2015-01-31 DIAGNOSIS — Z01419 Encounter for gynecological examination (general) (routine) without abnormal findings: Secondary | ICD-10-CM | POA: Insufficient documentation

## 2015-01-31 DIAGNOSIS — Z3491 Encounter for supervision of normal pregnancy, unspecified, first trimester: Secondary | ICD-10-CM | POA: Diagnosis not present

## 2015-01-31 DIAGNOSIS — Z1389 Encounter for screening for other disorder: Secondary | ICD-10-CM

## 2015-01-31 DIAGNOSIS — Z0283 Encounter for blood-alcohol and blood-drug test: Secondary | ICD-10-CM

## 2015-01-31 DIAGNOSIS — Z369 Encounter for antenatal screening, unspecified: Secondary | ICD-10-CM

## 2015-01-31 DIAGNOSIS — Z331 Pregnant state, incidental: Secondary | ICD-10-CM

## 2015-01-31 DIAGNOSIS — Z124 Encounter for screening for malignant neoplasm of cervix: Secondary | ICD-10-CM

## 2015-01-31 DIAGNOSIS — Z3490 Encounter for supervision of normal pregnancy, unspecified, unspecified trimester: Secondary | ICD-10-CM | POA: Insufficient documentation

## 2015-01-31 LAB — POCT URINALYSIS DIPSTICK
GLUCOSE UA: NEGATIVE
Ketones, UA: NEGATIVE
Leukocytes, UA: NEGATIVE
Nitrite, UA: NEGATIVE
PROTEIN UA: NEGATIVE
RBC UA: NEGATIVE

## 2015-01-31 NOTE — Patient Instructions (Signed)

## 2015-01-31 NOTE — Progress Notes (Signed)
  Subjective:  Jessica Ward is a 24 y.o. 423P2002 Caucasian female at 2651w1d by 8wk u/s,  being seen today for her first obstetrical visit.  Her obstetrical history is significant for term uncomplicated svb x 2.  Pregnancy history fully reviewed.  Patient reports n/v- rx'd diclegis yesterday- discussed directions of taking- if n/v not improving to let us know. Denies vb, cramping, uti s/s, abnormal/malodorous vag d/c, or vulvovaginal itching/irritation.  BP 118/72 mmHg  Pulse 68  Wt 195 lb (88.451 kg)  LMP 11/21/2013 (Exact Date)  HISTORY: OB History  Gravida Para Term Preterm AB SAB TAB Ectopic Multiple Living  3 2 2       2     # Outcome Date GA Lbr Len/2nd Weight Sex Delivery Anes PTL Lv  3 Current           2 Term 10/27/12 9377w6d 16:10 / 00:09 7 lb 0.4 oz (3.185 kg) F Vag-Spont EPI  Y  1 Term 12/02/08 4895w0d  7 lb 2 oz (3.232 kg) F Vag-Spont EPI N Y     Past Medical History  Diagnosis Date  . High cholesterol   . Normal delivery 10/27/2012   Past Surgical History  Procedure Laterality Date  . No past surgeries     Family History  Problem Relation Age of Onset  . Cancer Other     leukemia, breast-maternal great grandma  . Hypertension Mother   . Diabetes Maternal Grandfather   . Coronary artery disease Maternal Grandfather   . Heart attack Maternal Grandfather   . Stroke Maternal Grandfather   . Hyperlipidemia Father   . Heart disease Father     MI 12/2014  . Other Daughter     tubes in ears  . Other Daughter     tubes in ears    Exam   System:     General: Well developed & nourished, no acute distress   Skin: Warm & dry, normal coloration and turgor, no rashes   Neurologic: Alert & oriented, normal mood   Cardiovascular: Regular rate & rhythm   Respiratory: Effort & rate normal, LCTAB, acyanotic   Abdomen: Soft, non tender   Extremities: normal strength, tone   Pelvic Exam:    Perineum: Normal perineum   Vulva: Normal, no lesions   Vagina:  Normal mucosa,  normal discharge   Cervix: Normal, bulbous, appears closed   Uterus: Normal size/shape/contour for GA   Thin prep pap smear obtained w/ reflex high risk HPV cotesting FHR: 160 via doppler   Assessment:   Pregnancy: [id number] Patient Active Problem List   Diagnosis Date Noted  . Supervision of normal pregnancy 01/31/2015    Priority: High  . Encounter for IUD removal 08/22/2014    3851w1d G3P2002 New OB visit N/V of pregnancy  Plan:  Initial labs drawn Continue prenatal vitamins Problem list reviewed and updated Reviewed n/v relief measures and warning s/s to report Reviewed recommended weight gain based on pre-gravid BMI Encouraged well-balanced diet Genetic Screening discussed Integrated Screen: declined Cystic fibrosis screening discussed declined Ultrasound discussed; fetal survey: requested Follow up in 4 weeks for visit [facility] completed  Marge DuncansBooker, Shemeka Wardle Randall CNM, [facility]burgWHNP-BC 01/31/2015 10:57 AM

## 2015-02-01 LAB — CBC
HEMATOCRIT: 38.4 % (ref 34.0–46.6)
HEMOGLOBIN: 12.9 g/dL (ref 11.1–15.9)
MCH: 29.7 pg (ref 26.6–33.0)
MCHC: 33.6 g/dL (ref 31.5–35.7)
MCV: 89 fL (ref 79–97)
Platelets: 236 10*3/uL (ref 150–379)
RBC: 4.34 x10E6/uL (ref 3.77–5.28)
RDW: 13.3 % (ref 12.3–15.4)
WBC: 5.5 10*3/uL (ref 3.4–10.8)

## 2015-02-01 LAB — URINALYSIS, ROUTINE W REFLEX MICROSCOPIC
BILIRUBIN UA: NEGATIVE
GLUCOSE, UA: NEGATIVE
Nitrite, UA: NEGATIVE
PH UA: 6.5 (ref 5.0–7.5)
RBC, UA: NEGATIVE
Specific Gravity, UA: 1.025 (ref 1.005–1.030)
UUROB: 1 mg/dL (ref 0.2–1.0)

## 2015-02-01 LAB — URINE CULTURE: ORGANISM ID, BACTERIA: NO GROWTH

## 2015-02-01 LAB — PMP SCREEN PROFILE (10S), URINE
AMPHETAMINE SCRN UR: NEGATIVE ng/mL
Barbiturate Screen, Ur: NEGATIVE ng/mL
Benzodiazepine Screen, Urine: NEGATIVE ng/mL
CREATININE(CRT), U: 215.7 mg/dL (ref 20.0–300.0)
Cannabinoids Ur Ql Scn: NEGATIVE ng/mL
Cocaine(Metab.)Screen, Urine: NEGATIVE ng/mL
Methadone Scn, Ur: NEGATIVE ng/mL
OPIATE SCRN UR: NEGATIVE ng/mL
OXYCODONE+OXYMORPHONE UR QL SCN: NEGATIVE ng/mL
PCP SCRN UR: NEGATIVE ng/mL
PROPOXYPHENE SCREEN: NEGATIVE ng/mL
Ph of Urine: 6 (ref 4.5–8.9)

## 2015-02-01 LAB — HIV ANTIBODY (ROUTINE TESTING W REFLEX): HIV SCREEN 4TH GENERATION: NONREACTIVE

## 2015-02-01 LAB — RUBELLA SCREEN

## 2015-02-01 LAB — MICROSCOPIC EXAMINATION: CASTS: NONE SEEN /LPF

## 2015-02-01 LAB — ABO/RH: RH TYPE: POSITIVE

## 2015-02-01 LAB — RPR: RPR Ser Ql: NONREACTIVE

## 2015-02-01 LAB — ANTIBODY SCREEN: ANTIBODY SCREEN: NEGATIVE

## 2015-02-01 LAB — CYTOLOGY - PAP

## 2015-02-01 LAB — HEPATITIS B SURFACE ANTIGEN: Hepatitis B Surface Ag: NEGATIVE

## 2015-02-01 LAB — VARICELLA ZOSTER ANTIBODY, IGG: Varicella zoster IgG: 2188 index (ref >165)

## 2015-02-05 ENCOUNTER — Encounter: Payer: Self-pay | Admitting: Women's Health

## 2015-02-05 DIAGNOSIS — Z2839 Other underimmunization status: Secondary | ICD-10-CM | POA: Insufficient documentation

## 2015-02-05 DIAGNOSIS — Z283 Underimmunization status: Secondary | ICD-10-CM | POA: Insufficient documentation

## 2015-02-05 DIAGNOSIS — O9989 Other specified diseases and conditions complicating pregnancy, childbirth and the puerperium: Secondary | ICD-10-CM

## 2015-02-08 ENCOUNTER — Telehealth: Payer: Self-pay | Admitting: Advanced Practice Midwife

## 2015-02-08 DIAGNOSIS — Z3491 Encounter for supervision of normal pregnancy, unspecified, first trimester: Secondary | ICD-10-CM

## 2015-02-08 NOTE — Telephone Encounter (Signed)
Pt states that she needs her medicine. Pt aware that the prior authorization for the diclegis has been sent again and that there are samples in the front office for her to come by and pick them up.

## 2015-02-28 ENCOUNTER — Encounter: Payer: Self-pay | Admitting: Advanced Practice Midwife

## 2015-02-28 ENCOUNTER — Ambulatory Visit (INDEPENDENT_AMBULATORY_CARE_PROVIDER_SITE_OTHER): Payer: Medicaid Other | Admitting: Advanced Practice Midwife

## 2015-02-28 VITALS — BP 118/58 | HR 96 | Wt 195.0 lb

## 2015-02-28 DIAGNOSIS — Z363 Encounter for antenatal screening for malformations: Secondary | ICD-10-CM

## 2015-02-28 DIAGNOSIS — Z3492 Encounter for supervision of normal pregnancy, unspecified, second trimester: Secondary | ICD-10-CM

## 2015-02-28 DIAGNOSIS — Z1389 Encounter for screening for other disorder: Secondary | ICD-10-CM

## 2015-02-28 DIAGNOSIS — Z331 Pregnant state, incidental: Secondary | ICD-10-CM

## 2015-02-28 LAB — POCT URINALYSIS DIPSTICK
GLUCOSE UA: NEGATIVE
KETONES UA: NEGATIVE
Nitrite, UA: NEGATIVE
PROTEIN UA: NEGATIVE
RBC UA: NEGATIVE

## 2015-02-28 NOTE — Progress Notes (Signed)
[id number] 5161w1d Estimated Date of Delivery: 08/21/15  Blood pressure 118/58, pulse 96, weight 195 lb (88.451 kg), last menstrual period 11/21/2013.   BP weight and urine results all reviewed and noted.  Please refer to the obstetrical flow sheet for the fundal height and fetal heart rate documentation:  Patient denies any bleeding and no rupture of membranes symptoms or regular contractions. Still taking diclegis QID, it helps a lot Patient is without complaints. All questions were answered.  Plan:  Continued routine obstetrical care,   Follow up in 4 weeks for OB appointment, anatomy scan

## 2015-03-06 ENCOUNTER — Telehealth: Payer: Self-pay | Admitting: Women's Health

## 2015-03-06 NOTE — Telephone Encounter (Signed)
Pt states she has a yeast infection and wanted to know if we could call her in a medication or if she should take OTC med.  Advised pt we would prefer to see her to make sure it is actually yeast before treating her.  Pt agrees and call transferred to front staff for appointment to be made.

## 2015-03-07 ENCOUNTER — Ambulatory Visit (INDEPENDENT_AMBULATORY_CARE_PROVIDER_SITE_OTHER): Payer: Medicaid Other | Admitting: Advanced Practice Midwife

## 2015-03-07 ENCOUNTER — Encounter: Payer: Self-pay | Admitting: Advanced Practice Midwife

## 2015-03-07 VITALS — BP 110/60 | HR 88 | Wt 194.0 lb

## 2015-03-07 DIAGNOSIS — B373 Candidiasis of vulva and vagina: Secondary | ICD-10-CM

## 2015-03-07 DIAGNOSIS — Z3492 Encounter for supervision of normal pregnancy, unspecified, second trimester: Secondary | ICD-10-CM

## 2015-03-07 DIAGNOSIS — Z331 Pregnant state, incidental: Secondary | ICD-10-CM

## 2015-03-07 DIAGNOSIS — B3731 Acute candidiasis of vulva and vagina: Secondary | ICD-10-CM

## 2015-03-07 DIAGNOSIS — Z1389 Encounter for screening for other disorder: Secondary | ICD-10-CM

## 2015-03-07 LAB — POCT URINALYSIS DIPSTICK
GLUCOSE UA: NEGATIVE
KETONES UA: NEGATIVE
Nitrite, UA: NEGATIVE
RBC UA: NEGATIVE

## 2015-03-07 MED ORDER — FLUCONAZOLE 150 MG PO TABS: ORAL_TABLET | ORAL | Status: DC

## 2015-03-07 NOTE — Progress Notes (Signed)
WORK IN FOR POSSIBLE YEAST INFECTION:    CHIEF COMPLAINT/HPI:  24 y.o. female complains of copious, curd-like  vaginal discharge for 2-3  day(s). Denies abnormal vaginal bleeding, significant pelvic pain or fever. No UTI symptoms. Sexually active, does not use condoms, no change in partner. GC/CHL negative last month.  Patient's last menstrual period was 11/21/2013 (exact date).  No history of STD's.  Review of Systems  Constitutional: Negative for fever and chills Eyes: Negative for visual disturbances Respiratory: Negative for shortness of breath, dyspnea Cardiovascular: Negative for chest pain or palpitations  Gastrointestinal: Negative for vomiting, diarrhea and constipation Genitourinary: Negative for dysuria and urgency Musculoskeletal: Negative for back pain, joint pain, myalgias  Neurological: Negative for dizziness and headaches    Past Medical History: Past Medical History  Diagnosis Date  . High cholesterol   . Normal delivery 10/27/2012    Past Surgical History: Past Surgical History  Procedure Laterality Date  . No past surgeries      Obstetrical History: OB History    Gravida Para Term Preterm AB TAB SAB Ectopic Multiple Living   3 2 2       2         Allergies: No Known Allergies  PHYSICAL EXAM: General:  WDWN female, Alert, oriented X 3 Abdomen:  Soft, nontender Pelvic - Vulva erythemous.  Vagina red with curd like DC, no odor.      Assessment: Vaginal yeast Plan:  Orders Placed This Encounter  Procedures  . POCT urinalysis dipstick   Diflucan 150mg  X1; repeat in 3 days  CRESENZO-DISHMAN,Donjuan Robison 03/07/2015 11:47 AM

## 2015-03-29 ENCOUNTER — Encounter: Payer: Self-pay | Admitting: Advanced Practice Midwife

## 2015-03-29 ENCOUNTER — Ambulatory Visit (INDEPENDENT_AMBULATORY_CARE_PROVIDER_SITE_OTHER): Payer: BLUE CROSS/BLUE SHIELD

## 2015-03-29 ENCOUNTER — Ambulatory Visit (INDEPENDENT_AMBULATORY_CARE_PROVIDER_SITE_OTHER): Payer: Medicaid Other | Admitting: Advanced Practice Midwife

## 2015-03-29 VITALS — BP 104/76 | HR 92 | Wt 193.0 lb

## 2015-03-29 DIAGNOSIS — Z331 Pregnant state, incidental: Secondary | ICD-10-CM

## 2015-03-29 DIAGNOSIS — Z3492 Encounter for supervision of normal pregnancy, unspecified, second trimester: Secondary | ICD-10-CM

## 2015-03-29 DIAGNOSIS — Z3A19 19 weeks gestation of pregnancy: Secondary | ICD-10-CM | POA: Diagnosis not present

## 2015-03-29 DIAGNOSIS — Z3482 Encounter for supervision of other normal pregnancy, second trimester: Secondary | ICD-10-CM

## 2015-03-29 DIAGNOSIS — Z36 Encounter for antenatal screening of mother: Secondary | ICD-10-CM | POA: Diagnosis not present

## 2015-03-29 DIAGNOSIS — Z363 Encounter for antenatal screening for malformations: Secondary | ICD-10-CM

## 2015-03-29 DIAGNOSIS — Z1389 Encounter for screening for other disorder: Secondary | ICD-10-CM

## 2015-03-29 LAB — POCT URINALYSIS DIPSTICK
Glucose, UA: NEGATIVE
Ketones, UA: NEGATIVE
NITRITE UA: NEGATIVE
Protein, UA: NEGATIVE

## 2015-03-29 NOTE — Progress Notes (Signed)
[place] 19+2wks,measurements c/w dates,cepahlic,post pl gr 0,normal ov's bilat,cx 4.2cm,efw 323g,svp of fluid 4.7cm,fht 149bpm,anatomy complete w/no obvious abn seen

## 2015-03-29 NOTE — Progress Notes (Signed)
D5H2992 [redacted]w[redacted]d Estimated Date of Delivery: 08/21/15  Blood pressure 104/76, pulse 92, weight 193 lb (87.544 kg), last menstrual period 11/21/2013.   BP weight and urine results all reviewed and noted.  Please refer to the obstetrical flow sheet for the fundal height and fetal heart rate documentation:US 19+2wks,measurements c/w dates,cepahlic,post pl gr 0,normal ov's bilat,cx 4.2cm,efw 323g,svp of fluid 4.7cm,fht 149bpm,anatomy complete w/no obvious abn seen  Patient denies any bleeding and no rupture of membranes symptoms or regular contractions. Patient is without complaints. All questions were answered.  Plan:  Continued routine obstetrical care,   Follow up in 4 weeks for OB appointment,

## 2015-04-30 ENCOUNTER — Ambulatory Visit (INDEPENDENT_AMBULATORY_CARE_PROVIDER_SITE_OTHER): Payer: Medicaid Other | Admitting: Women's Health

## 2015-04-30 VITALS — BP 102/60 | HR 98 | Wt 189.5 lb

## 2015-04-30 DIAGNOSIS — Z331 Pregnant state, incidental: Secondary | ICD-10-CM

## 2015-04-30 DIAGNOSIS — Z3492 Encounter for supervision of normal pregnancy, unspecified, second trimester: Secondary | ICD-10-CM

## 2015-04-30 DIAGNOSIS — Z1389 Encounter for screening for other disorder: Secondary | ICD-10-CM

## 2015-04-30 LAB — POCT URINALYSIS DIPSTICK
Blood, UA: NEGATIVE
GLUCOSE UA: NEGATIVE
Ketones, UA: NEGATIVE
NITRITE UA: NEGATIVE
Protein, UA: NEGATIVE

## 2015-04-30 NOTE — Progress Notes (Signed)
Low-risk OB appointment [id number] 257w6d Estimated Date of Delivery: 08/21/15 BP 102/60 mmHg  Pulse 98  Wt 189 lb 8 oz (85.957 kg)  LMP 11/21/2013 (Exact Date)  BP, weight, and urine reviewed.  Refer to obstetrical flow sheet for FH & FHR.  Reports good fm.  Denies regular uc's, lof, vb, or uti s/s. No complaints. Unable to tolerate glucola last 2 pregnancies- wants alternative, can try 28 Brach's jelly beans- understands is not as sensitive as glucola.  Reviewed ptl s/s, fm. Plan:  Continue routine obstetrical care  F/U in 4wks for OB appointment and pn2

## 2015-04-30 NOTE — Patient Instructions (Signed)
You will have your sugar test next visit.  Please do not eat or drink anything after midnight the night before you come, not even water.  You will be here for at least two hours.    [employer] (bring the whole bag, you will need to eat 28 in 5 minutes)  Call the office [phone]) or go to [facility] if:  You begin to have strong, frequent contractions  Your water breaks.  Sometimes it is a big gush of fluid, sometimes it is just a trickle that keeps getting your panties wet or running down your legs  You have vaginal bleeding.  It is normal to have a small amount of spotting if your cervix was checked.   You don't feel your baby moving like normal.  If you don't, get you something to eat and drink and lay down and focus on feeling your baby move.   If your baby is still not moving like normal, you should call the office or go to [facility].  Second Trimester of Pregnancy The second trimester is from week 13 through week 28, months 4 through 6. The second trimester is often a time when you feel your best. Your body has also adjusted to being pregnant, and you begin to feel better physically. Usually, morning sickness has lessened or quit completely, you may have more energy, and you may have an increase in appetite. The second trimester is also a time when the fetus is growing rapidly. At the end of the sixth month, the fetus is about 9 inches long and weighs about 1 pounds. You will likely begin to feel the baby move (quickening) between 18 and 20 weeks of the pregnancy. BODY CHANGES Your body goes through many changes during pregnancy. The changes vary from woman to woman.   Your weight will continue to increase. You will notice your lower abdomen bulging out.  You may begin to get stretch marks on your hips, abdomen, and breasts.  You may develop headaches that can be relieved by medicines approved by your health care provider.  You may urinate more often because the  fetus is pressing on your bladder.  You may develop or continue to have heartburn as a result of your pregnancy.  You may develop constipation because certain hormones are causing the muscles that push waste through your intestines to slow down.  You may develop hemorrhoids or swollen, bulging veins (varicose veins).  You may have back pain because of the weight gain and pregnancy hormones relaxing your joints between the bones in your pelvis and as a result of a shift in weight and the muscles that support your balance.  Your breasts will continue to grow and be tender.  Your gums may bleed and may be sensitive to brushing and flossing.  Dark spots or blotches (chloasma, mask of pregnancy) may develop on your face. This will likely fade after the baby is born.  A dark line from your belly button to the pubic area (linea nigra) may appear. This will likely fade after the baby is born.  You may have changes in your hair. These can include thickening of your hair, rapid growth, and changes in texture. Some women also have hair loss during or after pregnancy, or hair that feels dry or thin. Your hair will most likely return to normal after your baby is born. WHAT TO EXPECT AT YOUR PRENATAL VISITS During a routine prenatal visit:  You will be weighed to make  sure you and the fetus are growing normally.  Your blood pressure will be taken.  Your abdomen will be measured to track your baby's growth.  The fetal heartbeat will be listened to.  Any test results from the previous visit will be discussed. Your health care provider may ask you:  How you are feeling.  If you are feeling the baby move.  If you have had any abnormal symptoms, such as leaking fluid, bleeding, severe headaches, or abdominal cramping.  If you have any questions. Other tests that may be performed during your second trimester include:  Blood tests that check for:  Low iron levels (anemia).  Gestational  diabetes (between 24 and 28 weeks).  Rh antibodies.  Urine tests to check for infections, diabetes, or protein in the urine.  An ultrasound to confirm the proper growth and development of the baby.  An amniocentesis to check for possible genetic problems.  Fetal screens for spina bifida and Down syndrome. HOME CARE INSTRUCTIONS   Avoid all smoking, herbs, alcohol, and unprescribed drugs. These chemicals affect the formation and growth of the baby.  Follow your health care provider's instructions regarding medicine use. There are medicines that are either safe or unsafe to take during pregnancy.  Exercise only as directed by your health care provider. Experiencing uterine cramps is a good sign to stop exercising.  Continue to eat regular, healthy meals.  Wear a good support bra for breast tenderness.  Do not use hot tubs, steam rooms, or saunas.  Wear your seat belt at all times when driving.  Avoid raw meat, uncooked cheese, cat litter boxes, and soil used by cats. These carry germs that can cause birth defects in the baby.  Take your prenatal vitamins.  Try taking a stool softener (if your health care provider approves) if you develop constipation. Eat more high-fiber foods, such as fresh vegetables or fruit and whole grains. Drink plenty of fluids to keep your urine clear or pale yellow.  Take warm sitz baths to soothe any pain or discomfort caused by hemorrhoids. Use hemorrhoid cream if your health care provider approves.  If you develop varicose veins, wear support hose. Elevate your feet for 15 minutes, 3-4 times a day. Limit salt in your diet.  Avoid heavy lifting, wear low heel shoes, and practice good posture.  Rest with your legs elevated if you have leg cramps or low back pain.  Visit your dentist if you have not gone yet during your pregnancy. Use a soft toothbrush to brush your teeth and be gentle when you floss.  A sexual relationship may be continued unless  your health care provider directs you otherwise.  Continue to go to all your prenatal visits as directed by your health care provider. SEEK MEDICAL CARE IF:   You have dizziness.  You have mild pelvic cramps, pelvic pressure, or nagging pain in the abdominal area.  You have persistent nausea, vomiting, or diarrhea.  You have a bad smelling vaginal discharge.  You have pain with urination. SEEK IMMEDIATE MEDICAL CARE IF:   You have a fever.  You are leaking fluid from your vagina.  You have spotting or bleeding from your vagina.  You have severe abdominal cramping or pain.  You have rapid weight gain or loss.  You have shortness of breath with chest pain.  You notice sudden or extreme swelling of your face, hands, ankles, feet, or legs.  You have not felt your baby move in over an hour.  You have severe headaches that do not go away with medicine.  You have vision changes. Document Released: 09/30/2001 Document Revised: 10/11/2013 Document Reviewed: 12/07/2012 [facility] Patient Information 2015 [employer], [place]. This information is not intended to replace advice given to you by your health care provider. Make sure you discuss any questions you have with your health care provider.

## 2015-05-28 ENCOUNTER — Encounter: Payer: Medicaid Other | Admitting: Women's Health

## 2015-05-28 ENCOUNTER — Other Ambulatory Visit: Payer: Medicaid Other

## 2015-05-29 ENCOUNTER — Encounter: Payer: Medicaid Other | Admitting: Women's Health

## 2015-06-01 ENCOUNTER — Other Ambulatory Visit: Payer: Medicaid Other

## 2015-06-01 ENCOUNTER — Other Ambulatory Visit: Payer: Self-pay | Admitting: Women's Health

## 2015-06-01 ENCOUNTER — Ambulatory Visit (INDEPENDENT_AMBULATORY_CARE_PROVIDER_SITE_OTHER): Payer: Medicaid Other | Admitting: Obstetrics & Gynecology

## 2015-06-01 VITALS — BP 122/84 | HR 92 | Wt 194.0 lb

## 2015-06-01 DIAGNOSIS — Z131 Encounter for screening for diabetes mellitus: Secondary | ICD-10-CM

## 2015-06-01 DIAGNOSIS — Z3483 Encounter for supervision of other normal pregnancy, third trimester: Secondary | ICD-10-CM

## 2015-06-01 DIAGNOSIS — Z331 Pregnant state, incidental: Secondary | ICD-10-CM

## 2015-06-01 DIAGNOSIS — Z3493 Encounter for supervision of normal pregnancy, unspecified, third trimester: Secondary | ICD-10-CM

## 2015-06-01 DIAGNOSIS — Z369 Encounter for antenatal screening, unspecified: Secondary | ICD-10-CM

## 2015-06-01 DIAGNOSIS — Z1389 Encounter for screening for other disorder: Secondary | ICD-10-CM

## 2015-06-01 LAB — POCT URINALYSIS DIPSTICK
Glucose, UA: NEGATIVE
Ketones, UA: NEGATIVE
Leukocytes, UA: NEGATIVE
Nitrite, UA: NEGATIVE

## 2015-06-01 NOTE — Addendum Note (Signed)
Addended by: Laylana Gerwig G on: 06/01/2015 12:10 PM   Modules accepted: Orders  

## 2015-06-01 NOTE — Progress Notes (Signed)
[id number] [redacted]w[redacted]d Estimated Date of Delivery: 08/21/15  Blood pressure 122/84, pulse 92, weight 194 lb (87.998 kg), last menstrual period 11/21/2013.   BP weight and urine results all reviewed and noted.  Please refer to the obstetrical flow sheet for the fundal height and fetal heart rate documentation:  Patient reports good fetal movement, denies any bleeding and no rupture of membranes symptoms or regular contractions. Patient is without complaints. All questions were answered.  Plan:  Continued routine obstetrical care,   Follow up in 3 weeks for OB appointment,   PN2 Monday 24 oz coke(78 grams carb) pt always throws up her glucolas

## 2015-06-02 LAB — CBC
HEMOGLOBIN: 11.5 g/dL (ref 11.1–15.9)
Hematocrit: 32.7 % — ABNORMAL LOW (ref 34.0–46.6)
MCH: 30.3 pg (ref 26.6–33.0)
MCHC: 35.2 g/dL (ref 31.5–35.7)
MCV: 86 fL (ref 79–97)
Platelets: 197 10*3/uL (ref 150–379)
RBC: 3.79 x10E6/uL (ref 3.77–5.28)
RDW: 13.8 % (ref 12.3–15.4)
WBC: 6.8 10*3/uL (ref 3.4–10.8)

## 2015-06-02 LAB — HIV ANTIBODY (ROUTINE TESTING W REFLEX): HIV Screen 4th Generation wRfx: NONREACTIVE

## 2015-06-02 LAB — HSV 2 ANTIBODY, IGG: HSV 2 Glycoprotein G Ab, IgG: <0.91 index (ref 0.00–0.90)

## 2015-06-02 LAB — ANTIBODY SCREEN: Antibody Screen: NEGATIVE

## 2015-06-02 LAB — RPR: RPR: NONREACTIVE

## 2015-06-13 LAB — GLUCOSE TOLERANCE, 2 HOURS W/ 1HR
GLUCOSE, FASTING: 76 mg/dL (ref 65–91)
Glucose, 1 hour: 70 mg/dL (ref 65–179)
Glucose, 2 hour: 75 mg/dL (ref 65–152)

## 2015-06-18 ENCOUNTER — Telehealth: Payer: Self-pay | Admitting: *Deleted

## 2015-06-18 NOTE — Telephone Encounter (Signed)
Pt's mother called and stated that the pt's step daughter has chicken pox they think. I spoke with Dr. Despina Hidden and he advised that if she had the chicken pox vaccine then she should be fine. Pt has had the chicken pox and the vaccine before. Pt's mom was advised of this and also advised to let us know if it turns out to be something different. She verbalized understanding.

## 2015-06-20 ENCOUNTER — Telehealth: Payer: Self-pay | Admitting: *Deleted

## 2015-06-20 NOTE — Telephone Encounter (Signed)
Pt informed of WNL Glucose tolerance test from 06/12/2015.

## 2015-06-22 ENCOUNTER — Encounter: Payer: Medicaid Other | Admitting: Obstetrics & Gynecology

## 2015-06-26 ENCOUNTER — Encounter: Payer: Self-pay | Admitting: Obstetrics & Gynecology

## 2015-06-26 ENCOUNTER — Ambulatory Visit (INDEPENDENT_AMBULATORY_CARE_PROVIDER_SITE_OTHER): Payer: Medicaid Other | Admitting: Obstetrics & Gynecology

## 2015-06-26 VITALS — BP 110/70 | HR 76 | Wt 197.0 lb

## 2015-06-26 DIAGNOSIS — S93402A Sprain of unspecified ligament of left ankle, initial encounter: Secondary | ICD-10-CM

## 2015-06-26 DIAGNOSIS — Z3493 Encounter for supervision of normal pregnancy, unspecified, third trimester: Secondary | ICD-10-CM

## 2015-06-26 DIAGNOSIS — Z331 Pregnant state, incidental: Secondary | ICD-10-CM

## 2015-06-26 DIAGNOSIS — Z1389 Encounter for screening for other disorder: Secondary | ICD-10-CM

## 2015-06-26 LAB — POCT URINALYSIS DIPSTICK
Blood, UA: 1
Glucose, UA: NEGATIVE
KETONES UA: NEGATIVE
Leukocytes, UA: NEGATIVE
Nitrite, UA: NEGATIVE
PROTEIN UA: NEGATIVE

## 2015-06-26 NOTE — Progress Notes (Signed)
[id number] [redacted]w[redacted]d Estimated Date of Delivery: 08/21/15  Blood pressure 110/70, pulse 76, weight 197 lb (89.359 kg), last menstrual period 11/21/2013.   BP weight and urine results all reviewed and noted.  Please refer to the obstetrical flow sheet for the fundal height and fetal heart rate documentation:  Patient reports good fetal movement, denies any bleeding and no rupture of membranes symptoms or regular contractions. Patient is without complaints. All questions were answered.  Orders Placed This Encounter  Procedures  . POCT urinalysis dipstick    Plan:  Continued routine obstetrical care,   Return in about 2 weeks (around 07/10/2015) for LROB.

## 2015-07-01 ENCOUNTER — Inpatient Hospital Stay (HOSPITAL_COMMUNITY)
Admission: AD | Admit: 2015-07-01 | Discharge: 2015-07-01 | Disposition: A | Discharge status: Home / Self Care | Payer: Medicaid Other | Source: Ambulatory Visit | Attending: Obstetrics & Gynecology | Admitting: Obstetrics & Gynecology

## 2015-07-01 ENCOUNTER — Encounter (HOSPITAL_COMMUNITY): Payer: Self-pay | Admitting: *Deleted

## 2015-07-01 DIAGNOSIS — Z3A32 32 weeks gestation of pregnancy: Secondary | ICD-10-CM | POA: Diagnosis not present

## 2015-07-01 DIAGNOSIS — O4693 Antepartum hemorrhage, unspecified, third trimester: Secondary | ICD-10-CM | POA: Diagnosis not present

## 2015-07-01 DIAGNOSIS — O468X3 Other antepartum hemorrhage, third trimester: Secondary | ICD-10-CM

## 2015-07-01 LAB — URINALYSIS, ROUTINE W REFLEX MICROSCOPIC
BILIRUBIN URINE: NEGATIVE
GLUCOSE, UA: NEGATIVE mg/dL
KETONES UR: NEGATIVE mg/dL
Leukocytes, UA: NEGATIVE
NITRITE: NEGATIVE
PH: 7 (ref 5.0–8.0)
Protein, ur: NEGATIVE mg/dL
Urobilinogen, UA: 0.2 mg/dL (ref 0.0–1.0)

## 2015-07-01 LAB — URINE MICROSCOPIC-ADD ON

## 2015-07-01 NOTE — MAU Note (Signed)
Pt states she noticed blood when she wiped 2 times. Has not noticed it since the first two times she went to the bathroom. Does not know if it was from vagina or urethra. Has noticed an increase in frequency but denies pain with urination. Denies any other type of pain. +FM.

## 2015-07-01 NOTE — MAU Provider Note (Signed)
History   G3P2002 @ 32.5 in with vag bleeding states pinkish when she wipes. Sex last night.  CSN: [id number]  Arrival date and time: 07/01/15 2037   First Provider Initiated Contact with Patient 07/01/15 2151      No chief complaint on file.  HPI  OB History    Gravida Para Term Preterm AB TAB SAB Ectopic Multiple Living   3 2 2       2       Past Medical History  Diagnosis Date  . High cholesterol   . Normal delivery 10/27/2012    Past Surgical History  Procedure Laterality Date  . No past surgeries      Family History  Problem Relation Age of Onset  . Cancer Other     leukemia, breast-maternal great grandma  . Hypertension Mother   . Diabetes Maternal Grandfather   . Coronary artery disease Maternal Grandfather   . Heart attack Maternal Grandfather   . Stroke Maternal Grandfather   . Hyperlipidemia Father   . Heart disease Father     MI 12/2014  . Heart attack Father   . Other Daughter     tubes in ears  . Other Daughter     tubes in ears    Social History  Substance Use Topics  . Smoking status: Never Smoker   . Smokeless tobacco: Never Used  . Alcohol Use: No    Allergies: No Known Allergies  Prescriptions prior to admission  Medication Sig Dispense Refill Last Dose  . acetaminophen (TYLENOL) 325 MG tablet Take 650 mg by mouth every 6 (six) hours as needed for mild pain or headache.    Past Week at Unknown time  . flintstones complete (FLINTSTONES) 60 MG chewable tablet Chew 2 tablets by mouth daily.    07/01/2015 at Unknown time  . ranitidine (ZANTAC) 150 MG tablet Take 150 mg by mouth at bedtime.   06/30/2015 at Unknown time  . Doxylamine-Pyridoxine 10-10 MG TBEC 2 PO qhs; may take 1po in am and 1po in afternoon prn nausea (Patient not taking: Reported on 06/26/2015) 24 tablet 0 Not Taking    Review of Systems  Constitutional: Negative.   HENT: Negative.   Eyes: Negative.   Respiratory: Negative.   Cardiovascular: Negative.   Gastrointestinal:  Negative.   Genitourinary: Negative.   Musculoskeletal: Negative.   Skin: Negative.   Neurological: Negative.   Endo/Heme/Allergies: Negative.   Psychiatric/Behavioral: Negative.    Physical Exam   Blood pressure 118/70, pulse 109, temperature 98.5 F (36.9 C), temperature source Oral, resp. rate 18, height 5\' 4"  (1.626 m), weight 198 lb 3.2 oz (89.903 kg), last menstrual period 11/21/2013, SpO2 100 %.  Physical Exam  Constitutional: She is oriented to person, place, and time. She appears well-developed and well-nourished.  HENT:  Head: Normocephalic.  Neck: Normal range of motion. Neck supple.  Cardiovascular: Normal rate, regular rhythm and normal heart sounds.   Respiratory: Effort normal and breath sounds normal. No respiratory distress.  GI: Soft. There is no tenderness.  Genitourinary: No bleeding in the vagina. No vaginal discharge found.  Musculoskeletal: Normal range of motion. She exhibits no edema.  Neurological: She is alert and oriented to person, place, and time.  Skin: Skin is warm and dry.  Psychiatric: She has a normal mood and affect. Her behavior is normal. Judgment and thought content normal.    MAU Course  Procedures  MDM Bleeding after sex   Assessment and Plan  SVE cl/th/post high,  no active bleeding. Will d/c home .  Wyvonnia Dusky DARLENE 07/01/2015, 9:52 PM

## 2015-07-10 ENCOUNTER — Ambulatory Visit (INDEPENDENT_AMBULATORY_CARE_PROVIDER_SITE_OTHER): Payer: Medicaid Other | Admitting: Advanced Practice Midwife

## 2015-07-10 ENCOUNTER — Encounter: Payer: Self-pay | Admitting: Advanced Practice Midwife

## 2015-07-10 VITALS — BP 122/74 | HR 96 | Wt 197.5 lb

## 2015-07-10 DIAGNOSIS — Z1389 Encounter for screening for other disorder: Secondary | ICD-10-CM

## 2015-07-10 DIAGNOSIS — Z3483 Encounter for supervision of other normal pregnancy, third trimester: Secondary | ICD-10-CM

## 2015-07-10 DIAGNOSIS — Z331 Pregnant state, incidental: Secondary | ICD-10-CM

## 2015-07-10 LAB — POCT URINALYSIS DIPSTICK
Glucose, UA: NEGATIVE
Ketones, UA: NEGATIVE
LEUKOCYTES UA: NEGATIVE
NITRITE UA: NEGATIVE
PROTEIN UA: NEGATIVE

## 2015-07-10 NOTE — Progress Notes (Signed)
[id number] [redacted]w[redacted]d Estimated Date of Delivery: 08/21/15  Blood pressure 122/74, pulse 96, weight 197 lb 8 oz (89.585 kg), last menstrual period 11/21/2013.   BP weight and urine results all reviewed and noted.  Please refer to the obstetrical flow sheet for the fundal height and fetal heart rate documentation:  Patient reports good fetal movement, denies any bleeding and no rupture of membranes symptoms or regular contractions. Patient is without complaints. All questions were answered.  Orders Placed This Encounter  Procedures  . POCT urinalysis dipstick    Plan:  Continued routine obstetrical care,   Return in about 2 weeks (around 07/24/2015) for LROB.

## 2015-07-10 NOTE — Progress Notes (Signed)
Pt denies any problems or concerns at this time.  

## 2015-07-24 ENCOUNTER — Encounter: Payer: Medicaid Other | Admitting: Women's Health

## 2015-07-25 ENCOUNTER — Ambulatory Visit (INDEPENDENT_AMBULATORY_CARE_PROVIDER_SITE_OTHER): Payer: Medicaid Other | Admitting: Women's Health

## 2015-07-25 ENCOUNTER — Encounter: Payer: Self-pay | Admitting: Women's Health

## 2015-07-25 VITALS — BP 102/64 | HR 68 | Wt 198.0 lb

## 2015-07-25 DIAGNOSIS — Z331 Pregnant state, incidental: Secondary | ICD-10-CM

## 2015-07-25 DIAGNOSIS — Z23 Encounter for immunization: Secondary | ICD-10-CM

## 2015-07-25 DIAGNOSIS — Z3493 Encounter for supervision of normal pregnancy, unspecified, third trimester: Secondary | ICD-10-CM

## 2015-07-25 DIAGNOSIS — Z1389 Encounter for screening for other disorder: Secondary | ICD-10-CM

## 2015-07-25 LAB — POCT URINALYSIS DIPSTICK
Glucose, UA: NEGATIVE
KETONES UA: NEGATIVE
LEUKOCYTES UA: NEGATIVE
Nitrite, UA: NEGATIVE

## 2015-07-25 NOTE — Progress Notes (Signed)
Low-risk OB appointment [id number] 104w1d Estimated Date of Delivery: 08/21/15 BP 102/64 mmHg  Pulse 68  Wt 198 lb (89.812 kg)  LMP 11/21/2013 (Exact Date)  BP, weight, and urine reviewed.  Refer to obstetrical flow sheet for FH & FHR.  Reports good fm.  Denies regular uc's, lof, vb, or uti s/s. No complaints. Reviewed ptl s/s, fkc. Plan:  Continue routine obstetrical care  F/U in 1wk  for OB appointment and gbs Flu shot

## 2015-07-25 NOTE — Patient Instructions (Signed)
Call the office ([phone]) or go to [facility] if:  You begin to have strong, frequent contractions  Your water breaks.  Sometimes it is a big gush of fluid, sometimes it is just a trickle that keeps getting your panties wet or running down your legs  You have vaginal bleeding.  It is normal to have a small amount of spotting if your cervix was checked.   You don't feel your baby moving like normal.  If you don't, get you something to eat and drink and lay down and focus on feeling your baby move.  You should feel at least 10 movements in 2 hours.  If you don't, you should call the office or go to [facility].    Braxton Hicks Contractions Contractions of the uterus can occur throughout pregnancy. Contractions are not always a sign that you are in labor.  WHAT ARE BRAXTON HICKS CONTRACTIONS?  Contractions that occur before labor are called Braxton Hicks contractions, or false labor. Toward the end of pregnancy (32-34 weeks), these contractions can develop more often and may become more forceful. This is not true labor because these contractions do not result in opening (dilatation) and thinning of the cervix. They are sometimes difficult to tell apart from true labor because these contractions can be forceful and people have different pain tolerances. You should not feel embarrassed if you go to the hospital with false labor. Sometimes, the only way to tell if you are in true labor is for your health care provider to look for changes in the cervix. If there are no prenatal problems or other health problems associated with the pregnancy, it is completely safe to be sent home with false labor and await the onset of true labor. HOW CAN YOU TELL THE DIFFERENCE BETWEEN TRUE AND FALSE LABOR? False Labor  The contractions of false labor are usually shorter and not as hard as those of true labor.   The contractions are usually irregular.   The contractions are often felt in the front of  the lower abdomen and in the groin.   The contractions may go away when you walk around or change positions while lying down.   The contractions get weaker and are shorter lasting as time goes on.   The contractions do not usually become progressively stronger, regular, and closer together as with true labor.  True Labor  Contractions in true labor last 30-70 seconds, become very regular, usually become more intense, and increase in frequency.   The contractions do not go away with walking.   The discomfort is usually felt in the top of the uterus and spreads to the lower abdomen and low back.   True labor can be determined by your health care provider with an exam. This will show that the cervix is dilating and getting thinner.  WHAT TO REMEMBER  Keep up with your usual exercises and follow other instructions given by your health care provider.   Take medicines as directed by your health care provider.   Keep your regular prenatal appointments.   Eat and drink lightly if you think you are going into labor.   If Braxton Hicks contractions are making you uncomfortable:   Change your position from lying down or resting to walking, or from walking to resting.   Sit and rest in a tub of warm water.   Drink 2-3 glasses of water. Dehydration may cause these contractions.   Do slow and deep breathing several times an hour.    WHEN SHOULD I SEEK IMMEDIATE MEDICAL CARE? Seek immediate medical care if:  Your contractions become stronger, more regular, and closer together.   You have fluid leaking or gushing from your vagina.   You have a fever.   You pass blood-tinged mucus.   You have vaginal bleeding.   You have continuous abdominal pain.   You have low back pain that you never had before.   You feel your baby's head pushing down and causing pelvic pressure.   Your baby is not moving as much as it used to.    This information is not intended to  replace advice given to you by your health care provider. Make sure you discuss any questions you have with your health care provider.   Document Released: 10/06/2005 Document Revised: 10/11/2013 Document Reviewed: 07/18/2013 Elsevier Interactive Patient Education 2016 [employer]vier Inc.  

## 2015-07-31 ENCOUNTER — Telehealth: Payer: Self-pay | Admitting: Women's Health

## 2015-07-31 ENCOUNTER — Encounter: Payer: Self-pay | Admitting: Obstetrics & Gynecology

## 2015-07-31 ENCOUNTER — Ambulatory Visit (INDEPENDENT_AMBULATORY_CARE_PROVIDER_SITE_OTHER): Payer: Medicaid Other | Admitting: Obstetrics & Gynecology

## 2015-07-31 ENCOUNTER — Encounter: Payer: Medicaid Other | Admitting: Women's Health

## 2015-07-31 VITALS — BP 100/70 | HR 76 | Wt 200.0 lb

## 2015-07-31 DIAGNOSIS — Z331 Pregnant state, incidental: Secondary | ICD-10-CM

## 2015-07-31 DIAGNOSIS — R519 Headache, unspecified: Secondary | ICD-10-CM

## 2015-07-31 DIAGNOSIS — Z3685 Encounter for antenatal screening for Streptococcus B: Secondary | ICD-10-CM

## 2015-07-31 DIAGNOSIS — Z3493 Encounter for supervision of normal pregnancy, unspecified, third trimester: Secondary | ICD-10-CM

## 2015-07-31 DIAGNOSIS — O26899 Other specified pregnancy related conditions, unspecified trimester: Secondary | ICD-10-CM

## 2015-07-31 DIAGNOSIS — R51 Headache: Secondary | ICD-10-CM

## 2015-07-31 DIAGNOSIS — Z1159 Encounter for screening for other viral diseases: Secondary | ICD-10-CM

## 2015-07-31 DIAGNOSIS — Z1389 Encounter for screening for other disorder: Secondary | ICD-10-CM

## 2015-07-31 DIAGNOSIS — Z118 Encounter for screening for other infectious and parasitic diseases: Secondary | ICD-10-CM

## 2015-07-31 LAB — POCT URINALYSIS DIPSTICK
Glucose, UA: NEGATIVE
Ketones, UA: NEGATIVE
Nitrite, UA: NEGATIVE
RBC UA: 1

## 2015-07-31 LAB — OB RESULTS CONSOLE GBS: STREP GROUP B AG: NEGATIVE

## 2015-07-31 NOTE — Telephone Encounter (Signed)
Spoke with pt. Pt woke up this am with a bad pain in head, on top part of forehead. She had blurred vision and pain in her eyes. I spoke with Kim and she advised she needs Selena Battento be seen either here or at [facility]. Pt voiced understanding. Call transferred to front desk for appt here. JSY

## 2015-07-31 NOTE — Progress Notes (Signed)
Work in ob visit:  Pt with bifrontal headache, sinuses maxillary Complaining of some runny and stuffiness this am  exam tender to frontal and maxillary sinuses BP ok   Recommend decongestant, OTC alka seltzer plus day night  G3P2002 [redacted]w[redacted]d Estimated Date of Delivery: 08/21/15  Blood pressure 100/70, pulse 76, weight 200 lb (90.719 kg), last menstrual period 11/21/2013.   BP weight and urine results all reviewed and noted.  Please refer to the obstetrical flow sheet for the fundal height and fetal heart rate documentation:  Patient reports good fetal movement, denies any bleeding and no rupture of membranes symptoms or regular contractions. Patient is without complaints. All questions were answered.  Orders Placed This Encounter  Procedures  . Culture, beta strep (group b only)  . GC/Chlamydia Probe Amp  . POCT urinalysis dipstick    Plan:  Continued routine obstetrical care,   No Follow-up on file.

## 2015-08-01 ENCOUNTER — Encounter: Payer: Medicaid Other | Admitting: Women's Health

## 2015-08-01 LAB — GC/CHLAMYDIA PROBE AMP
Chlamydia trachomatis, NAA: NEGATIVE
Neisseria gonorrhoeae by PCR: NEGATIVE

## 2015-08-04 LAB — CULTURE, BETA STREP (GROUP B ONLY): Strep Gp B Culture: NEGATIVE

## 2015-08-06 ENCOUNTER — Encounter: Payer: Self-pay | Admitting: Women's Health

## 2015-08-06 ENCOUNTER — Ambulatory Visit (INDEPENDENT_AMBULATORY_CARE_PROVIDER_SITE_OTHER): Payer: Medicaid Other | Admitting: Women's Health

## 2015-08-06 VITALS — BP 108/70 | HR 68 | Wt 202.0 lb

## 2015-08-06 DIAGNOSIS — Z3493 Encounter for supervision of normal pregnancy, unspecified, third trimester: Secondary | ICD-10-CM

## 2015-08-06 DIAGNOSIS — Z331 Pregnant state, incidental: Secondary | ICD-10-CM

## 2015-08-06 DIAGNOSIS — Z1389 Encounter for screening for other disorder: Secondary | ICD-10-CM

## 2015-08-06 LAB — POCT URINALYSIS DIPSTICK
Blood, UA: NEGATIVE
GLUCOSE UA: NEGATIVE
Ketones, UA: NEGATIVE
LEUKOCYTES UA: NEGATIVE
NITRITE UA: NEGATIVE
Protein, UA: NEGATIVE

## 2015-08-06 NOTE — Progress Notes (Signed)
Low-risk OB appointment [id number] 2967w6d Estimated Date of Delivery: 08/21/15 BP 108/70 mmHg  Pulse 68  Wt 202 lb (91.627 kg)  LMP 11/21/2013 (Exact Date)  BP, weight, and urine reviewed.  Refer to obstetrical flow sheet for FH & FHR.  Reports good fm.  Denies  lof, vb, or uti s/s. Contractions last night, lots of pressure, contractions just started back while sitting in office.  SVE by request: 3/50/-2, vtx Reviewed labor s/s, fkc, gbs-. Plan:  Continue routine obstetrical care  F/U in 1wk for OB appointment

## 2015-08-06 NOTE — Patient Instructions (Signed)
Call the office ([phone]) or go to [facility] if:  You begin to have strong, frequent contractions  Your water breaks.  Sometimes it is a big gush of fluid, sometimes it is just a trickle that keeps getting your panties wet or running down your legs  You have vaginal bleeding.  It is normal to have a small amount of spotting if your cervix was checked.   You don't feel your baby moving like normal.  If you don't, get you something to eat and drink and lay down and focus on feeling your baby move.  You should feel at least 10 movements in 2 hours.  If you don't, you should call the office or go to [facility].    Braxton Hicks Contractions Contractions of the uterus can occur throughout pregnancy. Contractions are not always a sign that you are in labor.  WHAT ARE BRAXTON HICKS CONTRACTIONS?  Contractions that occur before labor are called Braxton Hicks contractions, or false labor. Toward the end of pregnancy (32-34 weeks), these contractions can develop more often and may become more forceful. This is not true labor because these contractions do not result in opening (dilatation) and thinning of the cervix. They are sometimes difficult to tell apart from true labor because these contractions can be forceful and people have different pain tolerances. You should not feel embarrassed if you go to the hospital with false labor. Sometimes, the only way to tell if you are in true labor is for your health care provider to look for changes in the cervix. If there are no prenatal problems or other health problems associated with the pregnancy, it is completely safe to be sent home with false labor and await the onset of true labor. HOW CAN YOU TELL THE DIFFERENCE BETWEEN TRUE AND FALSE LABOR? False Labor  The contractions of false labor are usually shorter and not as hard as those of true labor.   The contractions are usually irregular.   The contractions are often felt in the front of  the lower abdomen and in the groin.   The contractions may go away when you walk around or change positions while lying down.   The contractions get weaker and are shorter lasting as time goes on.   The contractions do not usually become progressively stronger, regular, and closer together as with true labor.  True Labor  Contractions in true labor last 30-70 seconds, become very regular, usually become more intense, and increase in frequency.   The contractions do not go away with walking.   The discomfort is usually felt in the top of the uterus and spreads to the lower abdomen and low back.   True labor can be determined by your health care provider with an exam. This will show that the cervix is dilating and getting thinner.  WHAT TO REMEMBER  Keep up with your usual exercises and follow other instructions given by your health care provider.   Take medicines as directed by your health care provider.   Keep your regular prenatal appointments.   Eat and drink lightly if you think you are going into labor.   If Braxton Hicks contractions are making you uncomfortable:   Change your position from lying down or resting to walking, or from walking to resting.   Sit and rest in a tub of warm water.   Drink 2-3 glasses of water. Dehydration may cause these contractions.   Do slow and deep breathing several times an hour.    WHEN SHOULD I SEEK IMMEDIATE MEDICAL CARE? Seek immediate medical care if:  Your contractions become stronger, more regular, and closer together.   You have fluid leaking or gushing from your vagina.   You have a fever.   You pass blood-tinged mucus.   You have vaginal bleeding.   You have continuous abdominal pain.   You have low back pain that you never had before.   You feel your baby's head pushing down and causing pelvic pressure.   Your baby is not moving as much as it used to.    This information is not intended to  replace advice given to you by your health care provider. Make sure you discuss any questions you have with your health care provider.   Document Released: 10/06/2005 Document Revised: 10/11/2013 Document Reviewed: 07/18/2013 Elsevier Interactive Patient Education 2016 [employer]vier Inc.  

## 2015-08-07 ENCOUNTER — Inpatient Hospital Stay (HOSPITAL_COMMUNITY)
Admission: AD | Admit: 2015-08-07 | Discharge: 2015-08-07 | Disposition: A | Discharge status: Home / Self Care | Payer: Medicaid Other | Source: Ambulatory Visit | Attending: Family Medicine | Admitting: Family Medicine

## 2015-08-07 ENCOUNTER — Encounter: Payer: Medicaid Other | Admitting: Advanced Practice Midwife

## 2015-08-07 ENCOUNTER — Encounter (HOSPITAL_COMMUNITY): Payer: Self-pay | Admitting: *Deleted

## 2015-08-07 DIAGNOSIS — Z3493 Encounter for supervision of normal pregnancy, unspecified, third trimester: Secondary | ICD-10-CM | POA: Insufficient documentation

## 2015-08-07 NOTE — MAU Note (Signed)
Pt reports worsening contractions, SVE 3 in office yesterday. Denies bleeding or ROM

## 2015-08-07 NOTE — Discharge Instructions (Signed)
Keep your scheduled appointment for prenatal care. Call the office or provider on call with any concerns or return to MAU as needed.

## 2015-08-08 ENCOUNTER — Telehealth: Payer: Self-pay | Admitting: Women's Health

## 2015-08-08 ENCOUNTER — Ambulatory Visit (INDEPENDENT_AMBULATORY_CARE_PROVIDER_SITE_OTHER): Payer: Medicaid Other | Admitting: Advanced Practice Midwife

## 2015-08-08 VITALS — BP 112/76 | HR 96 | Wt 202.5 lb

## 2015-08-08 DIAGNOSIS — Z331 Pregnant state, incidental: Secondary | ICD-10-CM

## 2015-08-08 DIAGNOSIS — Z3493 Encounter for supervision of normal pregnancy, unspecified, third trimester: Secondary | ICD-10-CM

## 2015-08-08 DIAGNOSIS — O368130 Decreased fetal movements, third trimester, not applicable or unspecified: Secondary | ICD-10-CM

## 2015-08-08 DIAGNOSIS — Z1389 Encounter for screening for other disorder: Secondary | ICD-10-CM

## 2015-08-08 LAB — POCT URINALYSIS DIPSTICK
Blood, UA: NEGATIVE
Glucose, UA: NEGATIVE
Ketones, UA: NEGATIVE
LEUKOCYTES UA: NEGATIVE
NITRITE UA: NEGATIVE
PROTEIN UA: NEGATIVE

## 2015-08-08 NOTE — Progress Notes (Addendum)
[id number] 1817w1d Estimated Date of Delivery: 08/21/15  Blood pressure 112/76, pulse 96, weight 202 lb 8 oz (91.853 kg), last menstrual period 11/21/2013.  WORKIN FOR: DECREASED FETAL MOVEMENT TODAY BP weight and urine results all reviewed and noted.  Please refer to the obstetrical flow sheet for the fundal height and fetal heart rate documentation:  Patient reports DECREASED fetal movement, denies any bleeding and no rupture of membranes symptoms or regular contractions. NST very reactive All questions were answered.  Orders Placed This Encounter  Procedures  . POCT urinalysis dipstick    Plan:  Continued routine obstetrical care,   Return for As scheduled.

## 2015-08-08 NOTE — Telephone Encounter (Signed)
Spoke with pt. Pt was seen Monday and was dilated to a 3. She is having decreased fetal movement and back pain. Also, constant tightness in stomach. Pt to come in and see Drenda FreezeFran at 2:00 pm. JSY

## 2015-08-08 NOTE — Addendum Note (Signed)
Addended by: Debby BudLONG, Vinessa Macconnell R on: 08/08/2015 04:26 PM   Modules accepted: Orders

## 2015-08-10 ENCOUNTER — Inpatient Hospital Stay (HOSPITAL_COMMUNITY)
Admission: AD | Admit: 2015-08-10 | Discharge: 2015-08-10 | Disposition: A | Discharge status: Home / Self Care | Payer: Medicaid Other | Source: Ambulatory Visit | Attending: Obstetrics and Gynecology | Admitting: Obstetrics and Gynecology

## 2015-08-10 ENCOUNTER — Encounter (HOSPITAL_COMMUNITY): Payer: Self-pay | Admitting: *Deleted

## 2015-08-10 DIAGNOSIS — Z3493 Encounter for supervision of normal pregnancy, unspecified, third trimester: Secondary | ICD-10-CM | POA: Insufficient documentation

## 2015-08-10 NOTE — MAU Note (Addendum)
Pt reports she has contractions all day. Have gotten stronger this evening. Denies SROM or bleeding and stated she was 3cm dilatated at this weeks office visit.

## 2015-08-10 NOTE — Discharge Instructions (Signed)
Braxton Hicks Contractions °Contractions of the uterus can occur throughout pregnancy. Contractions are not always a sign that you are in labor.  °WHAT ARE BRAXTON HICKS CONTRACTIONS?  °Contractions that occur before labor are called Braxton Hicks contractions, or false labor. Toward the end of pregnancy (32-34 weeks), these contractions can develop more often and may become more forceful. This is not true labor because these contractions do not result in opening (dilatation) and thinning of the cervix. They are sometimes difficult to tell apart from true labor because these contractions can be forceful and people have different pain tolerances. You should not feel embarrassed if you go to the hospital with false labor. Sometimes, the only way to tell if you are in true labor is for your health care provider to look for changes in the cervix. °If there are no prenatal problems or other health problems associated with the pregnancy, it is completely safe to be sent home with false labor and await the onset of true labor. °HOW CAN YOU TELL THE DIFFERENCE BETWEEN TRUE AND FALSE LABOR? °False Labor °· The contractions of false labor are usually shorter and not as hard as those of true labor.   °· The contractions are usually irregular.   °· The contractions are often felt in the front of the lower abdomen and in the groin.   °· The contractions may go away when you walk around or change positions while lying down.   °· The contractions get weaker and are shorter lasting as time goes on.   °· The contractions do not usually become progressively stronger, regular, and closer together as with true labor.   °True Labor °· Contractions in true labor last 30-70 seconds, become very regular, usually become more intense, and increase in frequency.   °· The contractions do not go away with walking.   °· The discomfort is usually felt in the top of the uterus and spreads to the lower abdomen and low back.   °· True labor can be  determined by your health care provider with an exam. This will show that the cervix is dilating and getting thinner.   °WHAT TO REMEMBER °· Keep up with your usual exercises and follow other instructions given by your health care provider.   °· Take medicines as directed by your health care provider.   °· Keep your regular prenatal appointments.   °· Eat and drink lightly if you think you are going into labor.   °· If Braxton Hicks contractions are making you uncomfortable:   °¨ Change your position from lying down or resting to walking, or from walking to resting.   °¨ Sit and rest in a tub of warm water.   °¨ Drink 2-3 glasses of water. Dehydration may cause these contractions.   °¨ Do slow and deep breathing several times an hour.   °WHEN SHOULD I SEEK IMMEDIATE MEDICAL CARE? °Seek immediate medical care if: °· Your contractions become stronger, more regular, and closer together.   °· You have fluid leaking or gushing from your vagina.   °· You have a fever.   °· You pass blood-tinged mucus.   °· You have vaginal bleeding.   °· You have continuous abdominal pain.   °· You have low back pain that you never had before.   °· You feel your baby's head pushing down and causing pelvic pressure.   °· Your baby is not moving as much as it used to.   °  °This information is not intended to replace advice given to you by your health care provider. Make sure you discuss any questions you have with your health care   provider. °  °Document Released: 10/06/2005 Document Revised: 10/11/2013 Document Reviewed: 07/18/2013 °Elsevier Interactive Patient Education ©2016 [employer] ° ° ° °Third Trimester of Pregnancy °The third trimester is from week 29 through week 42, months 7 through 9. The third trimester is a time when the fetus is growing rapidly. At the end of the ninth month, the fetus is about 20 inches in length and weighs 6-10 pounds.  °BODY CHANGES °Your body goes through many changes during pregnancy. The changes vary  from woman to woman.  °· Your weight will continue to increase. You can expect to gain 25-35 pounds (11-16 kg) by the end of the pregnancy. °· You may begin to get stretch marks on your hips, abdomen, and breasts. °· You may urinate more often because the fetus is moving lower into your pelvis and pressing on your bladder. °· You may develop or continue to have heartburn as a result of your pregnancy. °· You may develop constipation because certain hormones are causing the muscles that push waste through your intestines to slow down. °· You may develop hemorrhoids or swollen, bulging veins (varicose veins). °· You may have pelvic pain because of the weight gain and pregnancy hormones relaxing your joints between the bones in your pelvis. Backaches may result from overexertion of the muscles supporting your posture. °· You may have changes in your hair. These can include thickening of your hair, rapid growth, and changes in texture. Some women also have hair loss during or after pregnancy, or hair that feels dry or thin. Your hair will most likely return to normal after your baby is born. °· Your breasts will continue to grow and be tender. A yellow discharge may leak from your breasts called colostrum. °· Your belly button may stick out. °· You may feel short of breath because of your expanding uterus. °· You may notice the fetus "dropping," or moving lower in your abdomen. °· You may have a bloody mucus discharge. This usually occurs a few days to a week before labor begins. °· Your cervix becomes thin and soft (effaced) near your due date. °WHAT TO EXPECT AT YOUR PRENATAL EXAMS  °You will have prenatal exams every 2 weeks until week 36. Then, you will have weekly prenatal exams. During a routine prenatal visit: °· You will be weighed to make sure you and the fetus are growing normally. °· Your blood pressure is taken. °· Your abdomen will be measured to track your baby's growth. °· The fetal heartbeat will be  listened to. °· Any test results from the previous visit will be discussed. °· You may have a cervical check near your due date to see if you have effaced. °At around 36 weeks, your caregiver will check your cervix. At the same time, your caregiver will also perform a test on the secretions of the vaginal tissue. This test is to determine if a type of bacteria, Group B streptococcus, is present. Your caregiver will explain this further. °Your caregiver may ask you: °· What your birth plan is. °· How you are feeling. °· If you are feeling the baby move. °· If you have had any abnormal symptoms, such as leaking fluid, bleeding, severe headaches, or abdominal cramping. °· If you are using any tobacco products, including cigarettes, chewing tobacco, and electronic cigarettes. °· If you have any questions. °Other tests or screenings that may be performed during your third trimester include: °· Blood tests that check for low iron levels (anemia). °· Fetal   testing to check the health, activity level, and growth of the fetus. Testing is done if you have certain medical conditions or if there are problems during the pregnancy. °· HIV (human immunodeficiency virus) testing. If you are at high risk, you may be screened for HIV during your third trimester of pregnancy. °FALSE LABOR °You may feel small, irregular contractions that eventually go away. These are called Braxton Hicks contractions, or false labor. Contractions may last for hours, days, or even weeks before true labor sets in. If contractions come at regular intervals, intensify, or become painful, it is best to be seen by your caregiver.  °SIGNS OF LABOR  °· Menstrual-like cramps. °· Contractions that are 5 minutes apart or less. °· Contractions that start on the top of the uterus and spread down to the lower abdomen and back. °· A sense of increased pelvic pressure or back pain. °· A watery or bloody mucus discharge that comes from the vagina. °If you have any of  these signs before the 37th week of pregnancy, call your caregiver right away. You need to go to the hospital to get checked immediately. °HOME CARE INSTRUCTIONS  °· Avoid all smoking, herbs, alcohol, and unprescribed drugs. These chemicals affect the formation and growth of the baby. °· Do not use any tobacco products, including cigarettes, chewing tobacco, and electronic cigarettes. If you need help quitting, ask your health care provider. You may receive counseling support and other resources to help you quit. °· Follow your caregiver's instructions regarding medicine use. There are medicines that are either safe or unsafe to take during pregnancy. °· Exercise only as directed by your caregiver. Experiencing uterine cramps is a good sign to stop exercising. °· Continue to eat regular, healthy meals. °· Wear a good support bra for breast tenderness. °· Do not use hot tubs, steam rooms, or saunas. °· Wear your seat belt at all times when driving. °· Avoid raw meat, uncooked cheese, cat litter boxes, and soil used by cats. These carry germs that can cause birth defects in the baby. °· Take your prenatal vitamins. °· Take 1500-2000 mg of calcium daily starting at the 20th week of pregnancy until you deliver your baby. °· Try taking a stool softener (if your caregiver approves) if you develop constipation. Eat more high-fiber foods, such as fresh vegetables or fruit and whole grains. Drink plenty of fluids to keep your urine clear or pale yellow. °· Take warm sitz baths to soothe any pain or discomfort caused by hemorrhoids. Use hemorrhoid cream if your caregiver approves. °· If you develop varicose veins, wear support hose. Elevate your feet for 15 minutes, 3-4 times a day. Limit salt in your diet. °· Avoid heavy lifting, wear low heal shoes, and practice good posture. °· Rest a lot with your legs elevated if you have leg cramps or low back pain. °· Visit your dentist if you have not gone during your pregnancy. Use a  soft toothbrush to brush your teeth and be gentle when you floss. °· A sexual relationship may be continued unless your caregiver directs you otherwise. °· Do not travel far distances unless it is absolutely necessary and only with the approval of your caregiver. °· Take prenatal classes to understand, practice, and ask questions about the labor and delivery. °· Make a trial run to the hospital. °· Pack your hospital bag. °· Prepare the baby's nursery. °· Continue to go to all your prenatal visits as directed by your caregiver. °SEEK MEDICAL CARE   IF: °· You are unsure if you are in labor or if your water has broken. °· You have dizziness. °· You have mild pelvic cramps, pelvic pressure, or nagging pain in your abdominal area. °· You have persistent nausea, vomiting, or diarrhea. °· You have a bad smelling vaginal discharge. °· You have pain with urination. °SEEK IMMEDIATE MEDICAL CARE IF:  °· You have a fever. °· You are leaking fluid from your vagina. °· You have spotting or bleeding from your vagina. °· You have severe abdominal cramping or pain. °· You have rapid weight loss or gain. °· You have shortness of breath with chest pain. °· You notice sudden or extreme swelling of your face, hands, ankles, feet, or legs. °· You have not felt your baby move in over an hour. °· You have severe headaches that do not go away with medicine. °· You have vision changes. °  °This information is not intended to replace advice given to you by your health care provider. Make sure you discuss any questions you have with your health care provider. °  °Document Released: 09/30/2001 Document Revised: 10/27/2014 Document Reviewed: 12/07/2012 °Elsevier Interactive Patient Education ©2016 [employer] Inc. ° °

## 2015-08-13 ENCOUNTER — Ambulatory Visit (INDEPENDENT_AMBULATORY_CARE_PROVIDER_SITE_OTHER): Payer: Medicaid Other | Admitting: Women's Health

## 2015-08-13 ENCOUNTER — Encounter: Payer: Self-pay | Admitting: Women's Health

## 2015-08-13 VITALS — BP 120/76 | HR 100 | Wt 202.5 lb

## 2015-08-13 DIAGNOSIS — Z3A38 38 weeks gestation of pregnancy: Secondary | ICD-10-CM

## 2015-08-13 DIAGNOSIS — Z3483 Encounter for supervision of other normal pregnancy, third trimester: Secondary | ICD-10-CM | POA: Diagnosis not present

## 2015-08-13 DIAGNOSIS — Z3493 Encounter for supervision of normal pregnancy, unspecified, third trimester: Secondary | ICD-10-CM

## 2015-08-13 DIAGNOSIS — Z331 Pregnant state, incidental: Secondary | ICD-10-CM

## 2015-08-13 DIAGNOSIS — Z1389 Encounter for screening for other disorder: Secondary | ICD-10-CM

## 2015-08-13 LAB — POCT URINALYSIS DIPSTICK
GLUCOSE UA: NEGATIVE
KETONES UA: NEGATIVE
LEUKOCYTES UA: NEGATIVE
Nitrite, UA: NEGATIVE

## 2015-08-13 NOTE — Progress Notes (Signed)
Low-risk OB appointment [id number] Estimated Date of Delivery: 08/21/15 BP 120/76 mmHg  Pulse 100  Wt 202 lb 8 oz (91.853 kg)  LMP 11/21/2013 (Exact Date)  BP, weight, and urine reviewed.  Refer to obstetrical flow sheet for FH & FHR.  Reports good fm.  Denies regular uc's, lof, vb, or uti s/s. No complaints. Wants membranes stripped- 1 day too early SVE per request: 3.5/60/-2, vtx Reviewed labor s/s, fkc. Plan:  Continue routine obstetrical care  F/U in 1wk for OB appointment  Order nexplanon today

## 2015-08-13 NOTE — Progress Notes (Signed)
Pt denies any problems or concerns at this time.  

## 2015-08-13 NOTE — Patient Instructions (Signed)
Call the office ([phone]) or go to [facility] if:  You begin to have strong, frequent contractions  Your water breaks.  Sometimes it is a big gush of fluid, sometimes it is just a trickle that keeps getting your panties wet or running down your legs  You have vaginal bleeding.  It is normal to have a small amount of spotting if your cervix was checked.   You don't feel your baby moving like normal.  If you don't, get you something to eat and drink and lay down and focus on feeling your baby move.  You should feel at least 10 movements in 2 hours.  If you don't, you should call the office or go to [facility].    Braxton Hicks Contractions Contractions of the uterus can occur throughout pregnancy. Contractions are not always a sign that you are in labor.  WHAT ARE BRAXTON HICKS CONTRACTIONS?  Contractions that occur before labor are called Braxton Hicks contractions, or false labor. Toward the end of pregnancy (32-34 weeks), these contractions can develop more often and may become more forceful. This is not true labor because these contractions do not result in opening (dilatation) and thinning of the cervix. They are sometimes difficult to tell apart from true labor because these contractions can be forceful and people have different pain tolerances. You should not feel embarrassed if you go to the hospital with false labor. Sometimes, the only way to tell if you are in true labor is for your health care provider to look for changes in the cervix. If there are no prenatal problems or other health problems associated with the pregnancy, it is completely safe to be sent home with false labor and await the onset of true labor. HOW CAN YOU TELL THE DIFFERENCE BETWEEN TRUE AND FALSE LABOR? False Labor  The contractions of false labor are usually shorter and not as hard as those of true labor.   The contractions are usually irregular.   The contractions are often felt in the front of  the lower abdomen and in the groin.   The contractions may go away when you walk around or change positions while lying down.   The contractions get weaker and are shorter lasting as time goes on.   The contractions do not usually become progressively stronger, regular, and closer together as with true labor.  True Labor  Contractions in true labor last 30-70 seconds, become very regular, usually become more intense, and increase in frequency.   The contractions do not go away with walking.   The discomfort is usually felt in the top of the uterus and spreads to the lower abdomen and low back.   True labor can be determined by your health care provider with an exam. This will show that the cervix is dilating and getting thinner.  WHAT TO REMEMBER  Keep up with your usual exercises and follow other instructions given by your health care provider.   Take medicines as directed by your health care provider.   Keep your regular prenatal appointments.   Eat and drink lightly if you think you are going into labor.   If Braxton Hicks contractions are making you uncomfortable:   Change your position from lying down or resting to walking, or from walking to resting.   Sit and rest in a tub of warm water.   Drink 2-3 glasses of water. Dehydration may cause these contractions.   Do slow and deep breathing several times an hour.    WHEN SHOULD I SEEK IMMEDIATE MEDICAL CARE? Seek immediate medical care if:  Your contractions become stronger, more regular, and closer together.   You have fluid leaking or gushing from your vagina.   You have a fever.   You pass blood-tinged mucus.   You have vaginal bleeding.   You have continuous abdominal pain.   You have low back pain that you never had before.   You feel your baby's head pushing down and causing pelvic pressure.   Your baby is not moving as much as it used to.    This information is not intended to  replace advice given to you by your health care provider. Make sure you discuss any questions you have with your health care provider.   Document Released: 10/06/2005 Document Revised: 10/11/2013 Document Reviewed: 07/18/2013 Elsevier Interactive Patient Education 2016 [employer]vier Inc.  

## 2015-08-14 ENCOUNTER — Inpatient Hospital Stay (HOSPITAL_COMMUNITY)
Admission: AD | Admit: 2015-08-14 | Discharge: 2015-08-16 | DRG: 775 | Disposition: A | Discharge status: Home / Self Care | Payer: Medicaid Other | Source: Ambulatory Visit | Attending: Obstetrics & Gynecology | Admitting: Obstetrics & Gynecology

## 2015-08-14 ENCOUNTER — Inpatient Hospital Stay (HOSPITAL_COMMUNITY): Payer: Medicaid Other | Admitting: Anesthesiology

## 2015-08-14 ENCOUNTER — Encounter (HOSPITAL_COMMUNITY): Payer: Self-pay | Admitting: *Deleted

## 2015-08-14 DIAGNOSIS — Z823 Family history of stroke: Secondary | ICD-10-CM | POA: Diagnosis not present

## 2015-08-14 DIAGNOSIS — O9962 Diseases of the digestive system complicating childbirth: Secondary | ICD-10-CM | POA: Diagnosis present

## 2015-08-14 DIAGNOSIS — Z8249 Family history of ischemic heart disease and other diseases of the circulatory system: Secondary | ICD-10-CM

## 2015-08-14 DIAGNOSIS — Z806 Family history of leukemia: Secondary | ICD-10-CM

## 2015-08-14 DIAGNOSIS — K219 Gastro-esophageal reflux disease without esophagitis: Secondary | ICD-10-CM | POA: Diagnosis present

## 2015-08-14 DIAGNOSIS — Z3A39 39 weeks gestation of pregnancy: Secondary | ICD-10-CM

## 2015-08-14 DIAGNOSIS — Z833 Family history of diabetes mellitus: Secondary | ICD-10-CM | POA: Diagnosis not present

## 2015-08-14 DIAGNOSIS — Z3493 Encounter for supervision of normal pregnancy, unspecified, third trimester: Secondary | ICD-10-CM

## 2015-08-14 LAB — CBC
HCT: 34.9 % — ABNORMAL LOW (ref 36.0–46.0)
HEMOGLOBIN: 11.7 g/dL — AB (ref 12.0–15.0)
MCH: 29 pg (ref 26.0–34.0)
MCHC: 33.5 g/dL (ref 30.0–36.0)
MCV: 86.6 fL (ref 78.0–100.0)
PLATELETS: 202 10*3/uL (ref 150–400)
RBC: 4.03 MIL/uL (ref 3.87–5.11)
RDW: 14.3 % (ref 11.5–15.5)
WBC: 11.1 10*3/uL — ABNORMAL HIGH (ref 4.0–10.5)

## 2015-08-14 LAB — TYPE AND SCREEN
ABO/RH(D): A POS
ANTIBODY SCREEN: NEGATIVE

## 2015-08-14 MED ORDER — LIDOCAINE HCL (PF) 1 % IJ SOLN
INTRAMUSCULAR | Status: DC | PRN
  Administered 2015-08-14 (×2): 4 mL via EPIDURAL

## 2015-08-14 MED ORDER — ACETAMINOPHEN 325 MG PO TABS: 650.0000 mg | ORAL_TABLET | ORAL | Status: DC | PRN

## 2015-08-14 MED ORDER — BENZOCAINE-MENTHOL 20-0.5 % EX AERO
1.0000 "application " | INHALATION_SPRAY | CUTANEOUS | Status: DC | PRN
  Filled 2015-08-14: qty 56

## 2015-08-14 MED ORDER — DIPHENHYDRAMINE HCL 25 MG PO CAPS: 25.0000 mg | ORAL_CAPSULE | Freq: Four times a day (QID) | ORAL | Status: DC | PRN

## 2015-08-14 MED ORDER — OXYCODONE-ACETAMINOPHEN 5-325 MG PO TABS: 1.0000 | ORAL_TABLET | ORAL | Status: DC | PRN

## 2015-08-14 MED ORDER — IBUPROFEN 600 MG PO TABS
600.0000 mg | ORAL_TABLET | Freq: Four times a day (QID) | ORAL | Status: DC
  Administered 2015-08-14 – 2015-08-16 (×7): 600 mg via ORAL
  Filled 2015-08-14 (×7): qty 1

## 2015-08-14 MED ORDER — CITRIC ACID-SODIUM CITRATE 334-500 MG/5ML PO SOLN: 30.0000 mL | ORAL | Status: DC | PRN

## 2015-08-14 MED ORDER — ONDANSETRON HCL 4 MG/2ML IJ SOLN
4.0000 mg | Freq: Four times a day (QID) | INTRAMUSCULAR | Status: DC | PRN
  Administered 2015-08-14: 4 mg via INTRAVENOUS
  Filled 2015-08-14: qty 2

## 2015-08-14 MED ORDER — OXYTOCIN BOLUS FROM INFUSION: 500.0000 mL | INTRAVENOUS | Status: DC

## 2015-08-14 MED ORDER — TETANUS-DIPHTH-ACELL PERTUSSIS 5-2.5-18.5 LF-MCG/0.5 IM SUSP: 0.5000 mL | Freq: Once | INTRAMUSCULAR | Status: DC

## 2015-08-14 MED ORDER — LIDOCAINE HCL (PF) 1 % IJ SOLN
30.0000 mL | INTRAMUSCULAR | Status: DC | PRN
  Filled 2015-08-14: qty 30

## 2015-08-14 MED ORDER — PRENATAL MULTIVITAMIN CH
1.0000 | ORAL_TABLET | Freq: Every day | ORAL | Status: DC
  Administered 2015-08-15 – 2015-08-16 (×2): 1 via ORAL
  Filled 2015-08-14 (×2): qty 1

## 2015-08-14 MED ORDER — OXYTOCIN 40 UNITS IN LACTATED RINGERS INFUSION - SIMPLE MED
1.0000 m[IU]/min | INTRAVENOUS | Status: DC
  Administered 2015-08-14: 2 m[IU]/min via INTRAVENOUS
  Filled 2015-08-14: qty 1000

## 2015-08-14 MED ORDER — WITCH HAZEL-GLYCERIN EX PADS: 1.0000 "application " | MEDICATED_PAD | CUTANEOUS | Status: DC | PRN

## 2015-08-14 MED ORDER — SIMETHICONE 80 MG PO CHEW: 80.0000 mg | CHEWABLE_TABLET | ORAL | Status: DC | PRN

## 2015-08-14 MED ORDER — DIBUCAINE 1 % RE OINT: 1.0000 "application " | TOPICAL_OINTMENT | RECTAL | Status: DC | PRN

## 2015-08-14 MED ORDER — EPHEDRINE 5 MG/ML INJ
10.0000 mg | INTRAVENOUS | Status: DC | PRN
  Filled 2015-08-14: qty 2

## 2015-08-14 MED ORDER — OXYCODONE-ACETAMINOPHEN 5-325 MG PO TABS: 2.0000 | ORAL_TABLET | ORAL | Status: DC | PRN

## 2015-08-14 MED ORDER — LACTATED RINGERS IV SOLN: 500.0000 mL | INTRAVENOUS | Status: DC | PRN

## 2015-08-14 MED ORDER — SENNOSIDES-DOCUSATE SODIUM 8.6-50 MG PO TABS
2.0000 | ORAL_TABLET | ORAL | Status: DC
  Administered 2015-08-14 – 2015-08-15 (×2): 2 via ORAL
  Filled 2015-08-14 (×2): qty 2

## 2015-08-14 MED ORDER — DIPHENHYDRAMINE HCL 50 MG/ML IJ SOLN: 12.5000 mg | INTRAMUSCULAR | Status: DC | PRN

## 2015-08-14 MED ORDER — ONDANSETRON HCL 4 MG/2ML IJ SOLN: 4.0000 mg | INTRAMUSCULAR | Status: DC | PRN

## 2015-08-14 MED ORDER — TERBUTALINE SULFATE 1 MG/ML IJ SOLN
0.2500 mg | Freq: Once | INTRAMUSCULAR | Status: DC | PRN
  Filled 2015-08-14: qty 1

## 2015-08-14 MED ORDER — OXYTOCIN 40 UNITS IN LACTATED RINGERS INFUSION - SIMPLE MED
62.5000 mL/h | INTRAVENOUS | Status: DC
  Administered 2015-08-14: 62.5 mL/h via INTRAVENOUS

## 2015-08-14 MED ORDER — MEASLES, MUMPS & RUBELLA VAC ~~LOC~~ INJ
0.5000 mL | INJECTION | Freq: Once | SUBCUTANEOUS | Status: AC
  Administered 2015-08-16: 0.5 mL via SUBCUTANEOUS
  Filled 2015-08-14: qty 0.5

## 2015-08-14 MED ORDER — LACTATED RINGERS IV SOLN
INTRAVENOUS | Status: DC
  Administered 2015-08-14 (×2): via INTRAVENOUS

## 2015-08-14 MED ORDER — LANOLIN HYDROUS EX OINT: TOPICAL_OINTMENT | CUTANEOUS | Status: DC | PRN

## 2015-08-14 MED ORDER — FENTANYL 2.5 MCG/ML BUPIVACAINE 1/10 % EPIDURAL INFUSION (WH - ANES)
14.0000 mL/h | INTRAMUSCULAR | Status: DC | PRN
  Administered 2015-08-14 (×2): 14 mL/h via EPIDURAL
  Filled 2015-08-14: qty 125

## 2015-08-14 MED ORDER — ONDANSETRON HCL 4 MG PO TABS: 4.0000 mg | ORAL_TABLET | ORAL | Status: DC | PRN

## 2015-08-14 MED ORDER — ACETAMINOPHEN 325 MG PO TABS
650.0000 mg | ORAL_TABLET | ORAL | Status: DC | PRN
  Administered 2015-08-15: 650 mg via ORAL
  Filled 2015-08-14: qty 2

## 2015-08-14 MED ORDER — PHENYLEPHRINE 40 MCG/ML (10ML) SYRINGE FOR IV PUSH (FOR BLOOD PRESSURE SUPPORT)
80.0000 ug | PREFILLED_SYRINGE | INTRAVENOUS | Status: DC | PRN
  Filled 2015-08-14: qty 2
  Filled 2015-08-14: qty 20

## 2015-08-14 NOTE — H&P (Signed)
LABOR AND DELIVERY ADMISSION HISTORY AND PHYSICAL NOTE  Jessica Ward is a 24 y.o. female G3P2002 with IUP at [redacted]w[redacted]d by ultrasound at [redacted] weeks ega presents for vaginal bleeding. Patient reports she awoke this morning with a moderate amount of vaginal bleeding in her underwear. She did not have any associated pain and the bleeding was not ongoing. She has presented to the MAU several times over the last few days for labor checks. She again returned this morning to the MAU for the above mentioned vaginal bleeding and occasional contractions. She denies any other complaints. She endorses good fetal movement and denies and LOF. Pertinent negatives on ROS inlcude no blurry vision, no headaches, no RUQ or epigastric pain.    Prenatal History/Complications: Two prior uncomplicated pregnancies delivered at term via NSVD.  [facility]  Initiated Care at  11.1wks   [facility], [place], 4th baby, married  Dating By 8wk u/s  Pap 01/31/15: neg  GC/CT Initial:   -/-             36+wks:  -/-  Genetic Screen NT/IT/AFP: declined   CF screen declined  Anatomic [place] Normal boy  Flu vaccine 07/25/2015   Tdap Recommended ~ 28wks  Glucose Screen  2 hr  GBS negative  Feed Preference breast  Contraception Nexplanon, ordered 08/13/15  Circumcision Yes, at [facility] declined  Pediatrician Larene Pickett     Past Medical History: Past Medical History  Diagnosis Date  . High cholesterol   . Normal delivery 10/27/2012    Past Surgical History: Past Surgical History  Procedure Laterality Date  . No past surgeries      Obstetrical History: OB History    Gravida Para Term Preterm AB TAB SAB Ectopic Multiple Living   '3 2 2       2      '$ Social History: Social History   Social History  . Marital Status: Married    Spouse Name: N/A  . Number of Children: N/A  . Years of Education: N/A   Social History Main Topics  . Smoking status: Never Smoker   . Smokeless tobacco: Never Used  .  Alcohol Use: No  . Drug Use: No  . Sexual Activity: Yes    Birth Control/ Protection: None   Other Topics Concern  . None   Social History Narrative    Family History: Family History  Problem Relation Age of Onset  . Cancer Other     leukemia, breast-maternal great grandma  . Hypertension Mother   . Diabetes Maternal Grandfather   . Coronary artery disease Maternal Grandfather   . Heart attack Maternal Grandfather   . Stroke Maternal Grandfather   . Hyperlipidemia Father   . Heart disease Father     MI 12/2014  . Heart attack Father   . Other Daughter     tubes in ears  . Other Daughter     tubes in ears    Allergies: No Known Allergies  Prescriptions prior to admission  Medication Sig Dispense Refill Last Dose  . acetaminophen (TYLENOL) 325 MG tablet Take 650 mg by mouth every 6 (six) hours as needed for mild pain or headache.    Past Week at Unknown time  . flintstones complete (FLINTSTONES) 60 MG chewable tablet Chew 2 tablets by mouth daily.    08/13/2015 at Unknown time  . ranitidine (ZANTAC) 150 MG tablet Take 150 mg by mouth at bedtime.   08/13/2015 at Unknown time  Review of Systems  Review of Systems  Constitutional: Negative for fever, chills and malaise/fatigue.  Eyes: Negative for blurred vision, double vision and photophobia.  Respiratory: Negative for shortness of breath.   Cardiovascular: Positive for leg swelling. Negative for chest pain.  Gastrointestinal: Positive for heartburn. Negative for nausea, vomiting, diarrhea and constipation.  Skin: Negative for rash.  Neurological: Negative for headaches.    Filed Vitals:   08/14/15 0635  BP: 128/80  Pulse: 133  Temp: 98.3 F (36.8 C)  TempSrc: Oral  Resp: 20    GEN: alert, comfortable-appearing woman resting in bed PULM: CTAB on frontal field exam CV: RRR, S1 and S2 heard, no M/R/G appreciated ABD: Fundus palpable at xyphoid. Abdomen NTTP. No epigastric of RUQ pain. No guarding. Fetus  felt to be vertex by Leopold's. GU: Speculum exam unremarkable without bleeding noted. Cervix 4.5 / 70% / -2 as determined by Laurey Arrow MD.  EXTR: No LE edema or calf tenderness.  FHT: HR  / moderate variability / +accels / decels Absent Toco: quiet   Prenatal labs: ABO, Rh: A/Positive/-- (04/13 1106) Antibody: Negative (08/12 1122) Rubella: Non-immune RPR: Non Reactive (08/12 1122)  HBsAg: Negative (04/13 1106)  HIV: Non Reactive (08/12 1122)  GBS: Negative (10/11 0000)  GTT: Fasting 76 > 1hr 70 > 2 hr 75 Genetic screening  Declined Anatomy US: 04/28/25, normal, c/w dates  Prenatal Transfer Tool  Maternal Diabetes: No Genetic Screening: Declined Maternal Ultrasounds/Referrals: Normal Fetal Ultrasounds or other Referrals:  None Maternal Substance Abuse:  No Significant Maternal Medications:  None Significant Maternal Lab Results: Lab values include: Other: Rubella NI  Results for orders placed or performed in visit on 08/13/15 (from the past 24 hour(s))  POCT urinalysis dipstick   Collection Time: 08/13/15 10:55 AM  Result Value Ref Range   Color, UA     Clarity, UA     Glucose, UA neg    Bilirubin, UA     Ketones, UA neg    Spec Grav, UA     Blood, UA trace    pH, UA     Protein, UA trace    Urobilinogen, UA     Nitrite, UA neg    Leukocytes, UA Negative Negative    Patient Active Problem List   Diagnosis Date Noted  . Normal labor 08/14/2015  . Rubella non-immune status, antepartum 02/05/2015  . Supervision of normal pregnancy 01/31/2015   Assessment: Jessica Ward is a 24 y.o. G3P2002 at [redacted]w[redacted]d admitted in latent labor. Multiparous, has made cervical change since check yesterday, and lives good distance from this hospital, so admitting for augmentation.  -- Labor: will augment with pitocin, AROM when safe to do so -- Rubella NI, will get MMR postpartum -- Patient may have epidural at her request -- GBS: negative -- Feeding: Breast -- Contraception:  *Nexplanon, already arranged postpartum -- Circ: yes, at [facility]  Diet: Regular for now PPx: None for now, low risk & ambulating Code Status: FULL Dispo: Admit to L&D for routine inpatient care of laboring patient.  Keane Scrape, MD 08/14/2015, 9:54 AM  OB FELLOW HISTORY AND PHYSICAL ATTESTATION  I have seen and examined this patient; I agree with above documentation in the resident's note.    Jessica Ward 08/14/2015, 10:48 AM

## 2015-08-14 NOTE — Anesthesia Procedure Notes (Signed)
Epidural Patient location during procedure: OB Start time: 08/14/2015 11:46 AM End time: 08/14/2015 11:53 AM  Staffing Anesthesiologist: Shona SimpsonHOLLIS, Raenette Sakata D Performed by: anesthesiologist   Preanesthetic Checklist Completed: patient identified, site marked, surgical consent, pre-op evaluation, timeout performed, IV checked, risks and benefits discussed and monitors and equipment checked  Epidural Patient position: sitting Prep: DuraPrep Patient monitoring: heart rate, continuous pulse ox and blood pressure Approach: midline Location: L4-L5 Injection technique: LOR saline  Needle:  Needle type: Tuohy  Needle gauge: 18 G Needle length: 9 cm and 9 Catheter type: closed end flexible Catheter size: 20 Guage Test dose: negative and Other  Assessment Events: blood not aspirated, injection not painful, no injection resistance, negative IV test and no paresthesia  Additional Notes LOR @ 6.5  Patient identified. Risks/Benefits/Options discussed with patient including but not limited to bleeding, infection, nerve damage, paralysis, failed block, incomplete pain control, headache, blood pressure changes, nausea, vomiting, reactions to medications, itching and postpartum back pain. Confirmed with bedside nurse the patient's most recent platelet count. Confirmed with patient that they are not currently taking any anticoagulation, have any bleeding history or any family history of bleeding disorders. Patient expressed understanding and wished to proceed. All questions were answered. Sterile technique was used throughout the entire procedure. Please see nursing notes for vital signs. Test dose was given through epidural catheter and negative prior to continuing to dose epidural or start infusion. Warning signs of high block given to the patient including shortness of breath, tingling/numbness in hands, complete motor block, or any concerning symptoms with instructions to call for help. Patient was  given instructions on fall risk and not to get out of bed. All questions and concerns addressed with instructions to call with any issues or inadequate analgesia.      Patient tolerated the insertion well without complications.Reason for block:procedure for pain

## 2015-08-14 NOTE — Anesthesia Preprocedure Evaluation (Signed)
Anesthesia Evaluation  Patient identified by MRN, date of birth, ID band Patient awake    Reviewed: Allergy & Precautions, NPO status , Patient's Chart, lab work & pertinent test results  Airway Mallampati: II  TM Distance: >3 FB Neck ROM: Full    Dental  (+) Teeth Intact   Pulmonary neg pulmonary ROS,    breath sounds clear to auscultation       Cardiovascular negative cardio ROS   Rhythm:Regular Rate:Normal     Neuro/Psych negative neurological ROS  negative psych ROS   GI/Hepatic Neg liver ROS, GERD  Medicated,  Endo/Other  negative endocrine ROS  Renal/GU negative Renal ROS  negative genitourinary   Musculoskeletal negative musculoskeletal ROS (+)   Abdominal   Peds negative pediatric ROS (+)  Hematology negative hematology ROS (+)   Anesthesia Other Findings   Reproductive/Obstetrics (+) Pregnancy                             Lab Results  Component Value Date   WBC 11.1* 08/14/2015   HGB 11.7* 08/14/2015   HCT 34.9* 08/14/2015   MCV 86.6 08/14/2015   PLT 202 08/14/2015   No results found for: INR, PROTIME   Anesthesia Physical Anesthesia Plan  ASA: II  Anesthesia Plan: Epidural   Post-op Pain Management:    Induction:   Airway Management Planned:   Additional Equipment:   Intra-op Plan:   Post-operative Plan:   Informed Consent: I have reviewed the patients History and Physical, chart, labs and discussed the procedure including the risks, benefits and alternatives for the proposed anesthesia with the patient or authorized representative who has indicated his/her understanding and acceptance.     Plan Discussed with:   Anesthesia Plan Comments:         Anesthesia Quick Evaluation

## 2015-08-14 NOTE — MAU Note (Signed)
PT SAYS  SHE HAS BEEN  HURTING  SINCE  10PM   THEN SHE  WENT  TO B-ROOM  AT  5 AM  AND  HAD BLOOD IN TOILET.       IN   ROOM   7-  NO  BLOOD  ON PERINEUM.       [facility]-   AT  [facility]  [facility]  YESTERDAY   4  CM.     DENIES HSV AND MRSA  .  GBS- NEG

## 2015-08-15 LAB — RPR: RPR Ser Ql: NONREACTIVE

## 2015-08-15 MED ORDER — IBUPROFEN 600 MG PO TABS: 600.0000 mg | ORAL_TABLET | Freq: Four times a day (QID) | ORAL | Status: DC

## 2015-08-15 NOTE — Anesthesia Postprocedure Evaluation (Signed)
Anesthesia Post Note  Patient: Jessica Ward  Procedure(s) Performed: * No procedures listed *  Anesthesia type: Epidural  Patient location: Mother/Baby  Post pain: Pain level controlled  Post assessment: Post-op Vital signs reviewed  Last Vitals:  Filed Vitals:   08/15/15 0500  BP: 108/60  Pulse: 70  Temp: 36.7 C  Resp: 16    Post vital signs: Reviewed  Level of consciousness: awake  Complications: No apparent anesthesia complications

## 2015-08-15 NOTE — Progress Notes (Signed)
UR chart review completed.  

## 2015-08-15 NOTE — Discharge Summary (Signed)
OB Discharge Summary     Patient Name: Jessica Ward DOB: 1991/04/16 MRN: [id number]  Date of admission: 08/14/2015 Delivering MD: Keane Scrape   Date of discharge: 08/15/2015  Admitting diagnosis: 39 WEEKS CTX BLEEDING Intrauterine pregnancy: [redacted]w[redacted]d     Secondary diagnosis:  Active Problems:   Normal labor  Additional problems: none     Discharge diagnosis: Term Pregnancy Delivered                                                                                                Post partum procedures:none  Augmentation: Pitocin  Complications: None  Hospital course:  Onset of Labor With Vaginal Delivery     24 y.o. yo G3P3003 at [redacted]w[redacted]d was admitted in Latent Laboron 08/14/2015. Patient had an uncomplicated labor course as follows:  Membrane Rupture Time/Date: 3:36 PM ,08/14/2015   Intrapartum Procedures: Episiotomy: None [1]                                         Lacerations:  1st degree [2];Perineal [11]  Patient had a delivery of a Viable infant. 08/14/2015  Information for the patient's newborn:  Jessica Ward, Jessica Ward [[id number]]  Delivery Method: Vag-Spont    Pateint had an uncomplicated postpartum course.  She is ambulating, tolerating a regular diet, passing flatus, and urinating well. Patient is discharged home in stable condition on 08/15/2015. MMR to be given prior to d/c as patient is rubella not immune.    Physical exam  Filed Vitals:   08/14/15 1820 08/14/15 1930 08/14/15 2222 08/15/15 0500  BP: 126/76 115/68 110/65 108/60  Pulse: 98 99 78 70  Temp: 98.2 F (36.8 C) 97.7 F (36.5 C)  98.1 F (36.7 C)  TempSrc: Oral Oral Oral Oral  Resp: $Remo'20 18 16 16  'RulTP$ Height:      Weight:      SpO2:       General: alert, cooperative and no distress Lochia: appropriate Uterine Fundus: firm Incision: N/A DVT Evaluation: No cords or calf tenderness. Labs: Lab Results  Component Value Date   WBC 11.1* 08/14/2015   HGB 11.7* 08/14/2015   HCT 34.9* 08/14/2015    MCV 86.6 08/14/2015   PLT 202 08/14/2015   CMP 07/31/2008  Glucose 82  BUN 4(L)  Creatinine 0.44  Sodium 139  Potassium 3.2(L)  Chloride 109  CO2 24  Calcium 8.8    Discharge instruction: per After Visit Summary and "Baby and Me Booklet".  Medications:  Current facility-administered medications:  .  acetaminophen (TYLENOL) tablet 650 mg, 650 mg, Oral, Q4H PRN, Keane Scrape, MD .  benzocaine-Menthol (DERMOPLAST) 20-0.5 % topical spray 1 application, 1 application, Topical, PRN, Keane Scrape, MD .  witch hazel-glycerin (TUCKS) pad 1 application, 1 application, Topical, PRN **AND** dibucaine (NUPERCAINAL) 1 % rectal ointment 1 application, 1 application, Rectal, PRN, Keane Scrape, MD .  diphenhydrAMINE (BENADRYL) capsule 25 mg, 25 mg, Oral, Q6H PRN, Keane Scrape, MD .  ibuprofen (ADVIL,MOTRIN) tablet 600 mg, 600 mg, Oral, 4  times per day, Keane Scrape, MD, 600 mg at 08/15/15 0458 .  lanolin ointment, , Topical, PRN, Keane Scrape, MD .  measles, mumps and rubella vaccine (MMR) injection 0.5 mL, 0.5 mL, Subcutaneous, Once, [facility], MD .  ondansetron [facility]) tablet 4 mg, 4 mg, Oral, Q4H PRN **OR** ondansetron (ZOFRAN) injection 4 mg, 4 mg, Intravenous, Q4H PRN, Keane Scrape, MD .  oxyCODONE-acetaminophen (PERCOCET/ROXICET) 5-325 MG per tablet 1 tablet, 1 tablet, Oral, Q4H PRN, Keane Scrape, MD .  oxyCODONE-acetaminophen (PERCOCET/ROXICET) 5-325 MG per tablet 2 tablet, 2 tablet, Oral, Q4H PRN, Keane Scrape, MD .  prenatal multivitamin tablet 1 tablet, 1 tablet, Oral, Q1200, Keane Scrape, MD .  senna-docusate (Senokot-S) tablet 2 tablet, 2 tablet, Oral, Q24H, Keane Scrape, MD, 2 tablet at 08/14/15 2336 .  simethicone (MYLICON) chewable tablet 80 mg, 80 mg, Oral, PRN, Keane Scrape, MD .  Tdap (BOOSTRIX) injection 0.5 mL, 0.5 mL, Intramuscular, Once, Keane Scrape, MD  Facility-Administered Medications Ordered in Other Encounters:   .  lidocaine (PF) (XYLOCAINE) 1 % injection, , , Anesthesia Intra-op, Effie Berkshire, MD, 4 mL at 08/14/15 1153 After visit meds:    Medication List    ASK your doctor about these medications        acetaminophen 325 MG tablet  Commonly known as:  TYLENOL  Take 650 mg by mouth every 6 (six) hours as needed for mild pain or headache.     flintstones complete 60 MG chewable tablet  Chew 2 tablets by mouth daily.     ranitidine 150 MG tablet  Commonly known as:  ZANTAC  Take 150 mg by mouth at bedtime.        Diet: routine diet  Activity: Advance as tolerated. Pelvic rest for 6 weeks.   Outpatient follow up:6 weeks Follow up Appt:Future Appointments Date Time Provider [place]  08/20/2015 1:45 PM Jessica Ward, CNM [facility]   Follow up Visit:No Follow-up on file.  Postpartum contraception: nexplanon  Newborn Data: Live born female  Birth Weight: 7 lb 9.3 oz (3440 g) APGAR: 9, 9  Baby Feeding: Breast Disposition:home with mother   08/15/2015 Jessica Maxim, MD

## 2015-08-16 NOTE — Discharge Instructions (Signed)
Breast Pumping Tips °If you are breastfeeding, there may be times when you cannot feed your baby directly. Returning to work or going on a trip are common examples. Pumping allows you to store breast milk and feed it to your baby later.  °You may not get much milk when you first start to pump. Your breasts should start to make more after a few days. If you pump at the times you usually feed your baby, you may be able to keep making enough milk to feed your baby without also using formula. The more often you pump, the more milk you will produce.  °WHEN SHOULD I PUMP?  °· You can begin to pump soon after delivery. However, some experts recommend waiting about 4 weeks before giving your infant a bottle to make sure breastfeeding is going well.  °· If you plan to return to work, begin pumping a few weeks before. This will help you develop techniques that work best for you. It also lets you build up a supply of breast milk.   °· When you are with your infant, feed on demand and pump after each feeding.   °· When you are away from your infant for several hours, pump for about 15 minutes every 2-3 hours. Pump both breasts at the same time if you can.   °· If your infant has a formula feeding, make sure to pump around the same time.     °· If you drink any alcohol, wait 2 hours before pumping.   °HOW DO I PREPARE TO PUMP? °Your let-down reflex is the natural reaction to stimulation that makes your breast milk flow. It is easier to stimulate this reflex when you are relaxed. Find relaxation techniques that work for you. If you have difficulty with your let-down reflex, try these methods:  °· Smell one of your infant's blankets or an item of clothing.   °· Look at a picture or video of your infant.   °· Sit in a quiet, private space.   °· Massage the breast you plan to pump.   °· Place soothing warmth on the breast.   °· Play relaxing music.   °WHAT ARE SOME GENERAL BREAST PUMPING TIPS? °· Wash your hands before you pump. You  do not need to wash your nipples or breasts. °· There are three ways to pump. °¨ You can use your hand to massage and compress your breast. °¨ You can use a handheld manual pump. °¨ You can use an electric pump.   °· Make sure the suction cup (flange) on the breast pump is the right size. Place the flange directly over the nipple. If it is the wrong size or placed the wrong way, it may be painful and cause nipple damage.   °· If pumping is uncomfortable, apply a small amount of purified or modified lanolin to your nipple and areola. °· If you are using an electric pump, adjust the speed and suction power to be more comfortable. °· If pumping is painful or if you are not getting very much milk, you may need a different type of pump. A lactation consultant can help you determine what type of pump to use.   °· Keep a full water bottle near you at all times. Drinking lots of fluid helps you make more milk.  °· You can store your milk to use later. Pumped breast milk can be stored in a sealable, sterile container or plastic bag. Label all stored breast milk with the date you pumped it. °¨ Milk can stay out at room temperature for up to 8 hours. °¨   You can store your milk in the refrigerator for up to 8 days.  You can store your milk in the freezer for 3 months. Thaw frozen milk using warm water. Do not put it in the microwave.  Do not smoke. Smoking can lower your milk supply and harm your infant. If you need help quitting, ask your health care provider to recommend a program.  WHEN SHOULD I CALL MY HEALTH CARE PROVIDER OR A LACTATION CONSULTANT?  You are having trouble pumping.  You are concerned that you are not making enough milk.  You have nipple pain, soreness, or redness.  You want to use birth control. Birth control pills may lower your milk supply. Talk to your health care provider about your options.   This information is not intended to replace advice given to you by your health care provider.  Make sure you discuss any questions you have with your health care provider.   Document Released: 03/26/2010 Document Revised: 10/11/2013 Document Reviewed: 07/29/2013 Elsevier Interactive Patient Education 2016 Elsevier Inc. Postpartum Care After Vaginal Delivery After you deliver your newborn (postpartum period), the usual stay in the hospital is 24-72 hours. If there were problems with your labor or delivery, or if you have other medical problems, you might be in the hospital longer.  While you are in the hospital, you will receive help and instructions on how to care for yourself and your newborn during the postpartum period.  While you are in the hospital:  Be sure to tell your nurses if you have pain or discomfort, as well as where you feel the pain and what makes the pain worse.  If you had an incision made near your vagina (episiotomy) or if you had some tearing during delivery, the nurses may put ice packs on your episiotomy or tear. The ice packs may help to reduce the pain and swelling.  If you are breastfeeding, you may feel uncomfortable contractions of your uterus for a couple of weeks. This is normal. The contractions help your uterus get back to normal size.  It is normal to have some bleeding after delivery.  For the first 1-3 days after delivery, the flow is red and the amount may be similar to a period.  It is common for the flow to start and stop.  In the first few days, you may pass some small clots. Let your nurses know if you begin to pass large clots or your flow increases.  Do not  flush blood clots down the toilet before having the nurse look at them.  During the next 3-10 days after delivery, your flow should become more watery and pink or brown-tinged in color.  Ten to fourteen days after delivery, your flow should be a small amount of yellowish-white discharge.  The amount of your flow will decrease over the first few weeks after delivery. Your flow may stop  in 6-8 weeks. Most women have had their flow stop by 12 weeks after delivery.  You should change your sanitary pads frequently.  Wash your hands thoroughly with soap and water for at least 20 seconds after changing pads, using the toilet, or before holding or feeding your newborn.  You should feel like you need to empty your bladder within the first 6-8 hours after delivery.  In case you become weak, lightheaded, or faint, call your nurse before you get out of bed for the first time and before you take a shower for the first time.  Within the  first few days after delivery, your breasts may begin to feel tender and full. This is called engorgement. Breast tenderness usually goes away within 48-72 hours after engorgement occurs. You may also notice milk leaking from your breasts. If you are not breastfeeding, do not stimulate your breasts. Breast stimulation can make your breasts produce more milk.  Spending as much time as possible with your newborn is very important. During this time, you and your newborn can feel close and get to know each other. Having your newborn stay in your room (rooming in) will help to strengthen the bond with your newborn. It will give you time to get to know your newborn and become comfortable caring for your newborn.  Your hormones change after delivery. Sometimes the hormone changes can temporarily cause you to feel sad or tearful. These feelings should not last more than a few days. If these feelings last longer than that, you should talk to your caregiver.  If desired, talk to your caregiver about methods of family planning or contraception.  Talk to your caregiver about immunizations. Your caregiver may want you to have the following immunizations before leaving the hospital:  Tetanus, diphtheria, and pertussis (Tdap) or tetanus and diphtheria (Td) immunization. It is very important that you and your family (including grandparents) or others caring for your  newborn are up-to-date with the Tdap or Td immunizations. The Tdap or Td immunization can help protect your newborn from getting ill.  Rubella immunization.  Varicella (chickenpox) immunization.  Influenza immunization. You should receive this annual immunization if you did not receive the immunization during your pregnancy.   This information is not intended to replace advice given to you by your health care provider. Make sure you discuss any questions you have with your health care provider.   Document Released: 08/03/2007 Document Revised: 06/30/2012 Document Reviewed: 06/02/2012 Elsevier Interactive Patient Education [employer]

## 2015-08-16 NOTE — Discharge Summary (Signed)
OB Discharge Summary    Patient Name: Jessica Ward DOB: 1991-06-07 MRN: [id number]  Date of admission: 08/14/2015 Delivering MD: Keane Scrape   Date of discharge: 08/16/2015  Admitting diagnosis: 39 WEEKS CTX BLEEDING Intrauterine pregnancy: [redacted]w[redacted]d     Secondary diagnosis: Active Problems:   Normal labor      Discharge diagnosis: Term Pregnancy Delivered                                                                                                Post partum procedures: none  Augmentation: none  Complications: None  Hospital course:   Jessica Ward is a 24 y.o. G3P3003 who presented on 08/14/2015 at [redacted]w[redacted]d for spontaneous onset of labor. Patient had an uncomplicated labor course. She progressed to active labor without issue or augmentation. At 1553 on 08/14/15 she delivered a healthy baby boy via normal spontaneous vaginal delivery. Pateint had an uncomplicated postpartum course and was discharged home in stable condition on 08/16/2015. MMR ordered to be given prior to discharge for rubella not immune status.  Exam at time of discharge:  Subjective: Patient reports doing well, feels ready to go home. Pain is well-controlled with ibuprofen. Ambulating, voiding, and tolerating PO without issue. +flatus, no BM yet. Pertinent negatives on ROS include no headache, no visual changes, no chest pain, no dyspnea, no nausea/vomiting, no epigastric pain, no RUQ pain, no dysuria, no hematuria.  Physical exam  08/15/15 1815 08/16/15 0620  BP: 110/71 115/62  Pulse: 84 82  Temp: 97.8 F (36.6 C) 98 F (36.7 C)  TempSrc: Oral Oral  Resp: 16 18   GEN: alert, comfortable-appearing woman resting in hospital bed, holding baby. Husband at bedside. PULM: CTAB on frontal field exam CV: RRR, S1 and S2 heard, no M/R/G appreciated ABD: Fundus firm below umbilicus. Abdomen appropriately TTP. No epigastric or RUQ pain. No guarding. GU: Lochia appropriate EXTR: No LE edema or calf  tenderness.   Discharge instruction: per After Visit Summary and "Baby and Me Booklet". -- Ibuprofen for pain management -- Continue prenatal vitamin -- Pelvic rest for 6 weeks -- f/u visits in 2 and 6 weeks with prenatal care provider  Medications:    Medication List    STOP taking these medications        ranitidine 150 MG tablet  Commonly known as:  ZANTAC      TAKE these medications        acetaminophen 325 MG tablet  Commonly known as:  TYLENOL  Take 650 mg by mouth every 6 (six) hours as needed for mild pain or headache.     flintstones complete 60 MG chewable tablet  Chew 2 tablets by mouth daily.     ibuprofen 600 MG tablet  Commonly known as:  ADVIL,MOTRIN  Take 1 tablet (600 mg total) by mouth every 6 (six) hours.        Diet: routine diet  Activity: Advance as tolerated. Pelvic rest for 6 weeks.   Outpatient follow up: 6 weeks  Postpartum contraception: Nexplanon  Newborn Data: Live born female  Birth Weight: 7 lb 9.3 oz (  3440 g) APGAR: 9, 9  Baby Feeding: Breast Disposition:home with mother  Keane Scrape, MD 08/16/2015 5:00 PM    OB FELLOW DISCHARGE ATTESTATION  I have seen and examined this patient and agree with above documentation in the resident's note.   Desma Maxim, MD 5:09 PM

## 2015-08-16 NOTE — Lactation Note (Signed)
This note was copied from the chart of Jessica Charlsie Merlesaylor Stanbery. Lactation Consultation Note BF often but only for 5 min. Intervals. Encouraged to BF longer. Mom states she falls a sleep. Encouraged STS, no blankets, stimulate, rub, touch, talk to the baby. Burp baby and put to the other breast. Encouraged breast massage and expression during BF to stimulate the baby to suckle when colostrum expressed into mouth. Mom states she sees colostrum. Discuss weight loss and output w/low input. Mom stated she would try these for next feeding. Encouraged to call for assistance if needed. Patient Name: Jessica Ward ZOXWR'UToday's Date: 08/16/2015 Reason for consult: Follow-up assessment;Infant weight loss   Maternal Data    Feeding Feeding Type: Breast Fed Length of feed: 10 min  LATCH Score/Interventions Latch: Grasps breast easily, tongue down, lips flanged, rhythmical sucking.  Audible Swallowing: Spontaneous and intermittent  Type of Nipple: Everted at rest and after stimulation  Comfort (Breast/Nipple): Soft / non-tender     Hold (Positioning): No assistance needed to correctly position infant at breast.  LATCH Score: 10  Lactation Tools Discussed/Used     Consult Status Consult Status: PRN Date: 08/17/15 Follow-up type: In-patient    Charyl DancerCARVER, Kahne Helfand G 08/16/2015, 5:23 AM

## 2015-08-20 ENCOUNTER — Encounter: Payer: Medicaid Other | Admitting: Women's Health

## 2015-09-17 ENCOUNTER — Ambulatory Visit: Payer: Medicaid Other | Admitting: Women's Health

## 2015-09-17 ENCOUNTER — Encounter: Payer: Self-pay | Admitting: Women's Health

## 2015-09-17 ENCOUNTER — Ambulatory Visit (INDEPENDENT_AMBULATORY_CARE_PROVIDER_SITE_OTHER): Payer: Medicaid Other | Admitting: Women's Health

## 2015-09-17 DIAGNOSIS — Z3202 Encounter for pregnancy test, result negative: Secondary | ICD-10-CM

## 2015-09-17 DIAGNOSIS — Z30011 Encounter for initial prescription of contraceptive pills: Secondary | ICD-10-CM

## 2015-09-17 LAB — POCT URINE PREGNANCY: Preg Test, Ur: NEGATIVE

## 2015-09-17 MED ORDER — NORETHINDRONE 0.35 MG PO TABS: ORAL_TABLET | ORAL | Status: DC

## 2015-09-17 NOTE — Patient Instructions (Signed)
Set an alarm to remind you to take your pills at the exact same time daily  Norethindrone tablets (contraception) What is this medicine? NORETHINDRONE (nor eth IN drone) is an oral contraceptive. The product contains a female hormone known as a progestin. It is used to prevent pregnancy. This medicine may be used for other purposes; ask your health care provider or pharmacist if you have questions. What should I tell my health care provider before I take this medicine? They need to know if you have any of these conditions: -blood vessel disease or blood clots -breast, cervical, or vaginal cancer -diabetes -heart disease -kidney disease -liver disease -mental depression -migraine -seizures -stroke -vaginal bleeding -an unusual or allergic reaction to norethindrone, other medicines, foods, dyes, or preservatives -pregnant or trying to get pregnant -breast-feeding How should I use this medicine? Take this medicine by mouth with a glass of water. You may take it with or without food. Follow the directions on the prescription label. Take this medicine at the same time each day and in the order directed on the package. Do not take your medicine more often than directed. Contact your pediatrician regarding the use of this medicine in children. Special care may be needed. This medicine has been used in female children who have started having menstrual periods. A patient package insert for the product will be given with each prescription and refill. Read this sheet carefully each time. The sheet may change frequently. Overdosage: If you think you have taken too much of this medicine contact a poison control center or emergency room at once. NOTE: This medicine is only for you. Do not share this medicine with others. What if I miss a dose? Try not to miss a dose. Every time you miss a dose or take a dose late your chance of pregnancy increases. When 1 pill is missed (even if only 3 hours late),  take the missed pill as soon as possible and continue taking a pill each day at the regular time (use a back up method of birth control for the next 48 hours). If more than 1 dose is missed, use an additional birth control method for the rest of your pill pack until menses occurs. Contact your health care professional if more than 1 dose has been missed. What may interact with this medicine? Do not take this medicine with any of the following medications: -amprenavir or fosamprenavir -bosentan This medicine may also interact with the following medications: -antibiotics or medicines for infections, especially rifampin, rifabutin, rifapentine, and griseofulvin, and possibly penicillins or tetracyclines -aprepitant -barbiturate medicines, such as phenobarbital -carbamazepine -felbamate -modafinil -oxcarbazepine -phenytoin -ritonavir or other medicines for HIV infection or AIDS -St. John's wort -topiramate This list may not describe all possible interactions. Give your health care provider a list of all the medicines, herbs, non-prescription drugs, or dietary supplements you use. Also tell them if you smoke, drink alcohol, or use illegal drugs. Some items may interact with your medicine. What should I watch for while using this medicine? Visit your doctor or health care professional for regular checks on your progress. You will need a regular breast and pelvic exam and Pap smear while on this medicine. Use an additional method of birth control during the first cycle that you take these tablets. If you have any reason to think you are pregnant, stop taking this medicine right away and contact your doctor or health care professional. If you are taking this medicine for hormone related problems, it may  take several cycles of use to see improvement in your condition. This medicine does not protect you against HIV infection (AIDS) or any other sexually transmitted diseases. What side effects may I  notice from receiving this medicine? Side effects that you should report to your doctor or health care professional as soon as possible: -breast tenderness or discharge -pain in the abdomen, chest, groin or leg -severe headache -skin rash, itching, or hives -sudden shortness of breath -unusually weak or tired -vision or speech problems -yellowing of skin or eyes Side effects that usually do not require medical attention (report to your doctor or health care professional if they continue or are bothersome): -changes in sexual desire -change in menstrual flow -facial hair growth -fluid retention and swelling -headache -irritability -nausea -weight gain or loss This list may not describe all possible side effects. Call your doctor for medical advice about side effects. You may report side effects to FDA at 1-800-FDA-1088. Where should I keep my medicine? Keep out of the reach of children. Store at room temperature between 15 and 30 degrees C (59 and 86 degrees F). Throw away any unused medicine after the expiration date. NOTE: This sheet is a summary. It may not cover all possible information. If you have questions about this medicine, talk to your doctor, pharmacist, or health care provider.    2016, Elsevier/[employer]. (2012-06-25 16:41:35)

## 2015-09-17 NOTE — Progress Notes (Signed)
Patient ID: Jessica Ward, female   DOB: [birth year], 22 y.o.   MRN: [id number] Subjective:    Jessica Ward is a 24 y.o. [id number] Caucasian female who presents for a postpartum visit. She is 4 weeks postpartum following a spontaneous vaginal delivery at 39.0 gestational weeks. Anesthesia: epidural. I have fully reviewed the prenatal and intrapartum course. Postpartum course has been uncomplicated. Baby's course has been uncomplicated. Baby is feeding by breast. Bleeding no bleeding. Bowel function is normal. Bladder function is normal. Patient is not sexually active. Last sexual activity: prior to birth of baby. Contraception method is wanted nexplanon during pregnancy- was ordered 10/24, but has changed mind and wants POPs- husband may be getting a vasectomy. Postpartum depression screening: negative. Score 0.  Last pap 01/31/15 and was neg.  The following portions of the patient's history were reviewed and updated as appropriate: allergies, current medications, past medical history, past surgical history and problem list.  Review of Systems Pertinent items are noted in HPI.   Filed Vitals:   09/17/15 1410  BP: 122/84  Pulse: 78  Height: 5\' 5"  (1.651 m)  Weight: 180 lb (81.647 kg)   No LMP recorded.  Objective:   General:  alert, cooperative and no distress   Breasts:  deferred, no complaints  Lungs: clear to auscultation bilaterally  Heart:  regular rate and rhythm  Abdomen: soft, nontender   Vulva: normal  Vagina: normal vagina  Cervix:  closed  Corpus: Well-involuted  Adnexa:  Non-palpable  Rectal Exam: No hemorrhoids        Assessment:   Postpartum exam 4 wks s/p SVB Breastfeeding Depression screening Contraception counseling   Plan:   Contraception: rx POPs, to let us know if changes mind and wants nexplanon  Follow up in: 1 year for physical or earlier if needed  Marge DuncansBooker, Phares Zaccone Randall CNM, [facility]NP-BC 09/17/2015 2:16 PM

## 2016-04-23 ENCOUNTER — Telehealth: Payer: Self-pay | Admitting: Women's Health

## 2016-04-24 NOTE — Telephone Encounter (Signed)
Pt informed per [name], CNM Excedrin safe while breastfeeding occasionally. Pt verbalized understanding.

## 2016-09-18 ENCOUNTER — Other Ambulatory Visit: Payer: Medicaid Other | Admitting: Women's Health

## 2016-11-17 ENCOUNTER — Other Ambulatory Visit: Payer: Self-pay | Admitting: Obstetrics & Gynecology

## 2016-11-17 DIAGNOSIS — O3680X0 Pregnancy with inconclusive fetal viability, not applicable or unspecified: Secondary | ICD-10-CM

## 2016-11-18 ENCOUNTER — Other Ambulatory Visit: Payer: Medicaid Other

## 2016-11-18 ENCOUNTER — Ambulatory Visit (INDEPENDENT_AMBULATORY_CARE_PROVIDER_SITE_OTHER): Payer: BLUE CROSS/BLUE SHIELD

## 2016-11-18 ENCOUNTER — Other Ambulatory Visit: Payer: Self-pay | Admitting: Obstetrics & Gynecology

## 2016-11-18 ENCOUNTER — Ambulatory Visit: Payer: Medicaid Other | Admitting: *Deleted

## 2016-11-18 DIAGNOSIS — Z3A01 Less than 8 weeks gestation of pregnancy: Secondary | ICD-10-CM | POA: Diagnosis not present

## 2016-11-18 DIAGNOSIS — O3680X0 Pregnancy with inconclusive fetal viability, not applicable or unspecified: Secondary | ICD-10-CM

## 2016-11-18 NOTE — Progress Notes (Signed)
US 6+2 wks GS,NO ys or fetal pole seen,normal ov's bilat,GS 13.9 mm,pt is having labs done today and f/u ultrasound in 10 days,per Victorino DikeJennifer

## 2016-11-19 ENCOUNTER — Telehealth: Payer: Self-pay | Admitting: Adult Health

## 2016-11-19 LAB — BETA HCG QUANT (REF LAB): hCG Quant: 8631 m[IU]/mL

## 2016-11-19 NOTE — Telephone Encounter (Signed)
Spoke with pt letting her know her quant showed she was between 6-[redacted] weeks pregnant. Advised to keep appt for US. Pt voiced understanding. JSY

## 2016-11-20 ENCOUNTER — Telehealth: Payer: Self-pay | Admitting: Obstetrics & Gynecology

## 2016-11-20 NOTE — Telephone Encounter (Signed)
Spoke with pt. Pt had some light spotting last pm. Today, she has brown spotting. I advised it sounds like old blood. Pt was advised to take it easy and drink plenty of fluids. I advised everything sounds ok at this point. If anything changes, let us know. Pt has an appt 2/7. Advised to keep that appt. Pt voiced understanding. JSY

## 2016-11-25 ENCOUNTER — Telehealth: Payer: Self-pay | Admitting: Adult Health

## 2016-11-25 ENCOUNTER — Other Ambulatory Visit: Payer: Self-pay | Admitting: Obstetrics & Gynecology

## 2016-11-25 ENCOUNTER — Other Ambulatory Visit: Payer: Self-pay | Admitting: Advanced Practice Midwife

## 2016-11-25 DIAGNOSIS — O3680X0 Pregnancy with inconclusive fetal viability, not applicable or unspecified: Secondary | ICD-10-CM

## 2016-11-25 NOTE — Telephone Encounter (Signed)
Patient called stating she is bleeding like a period. She was seen on 11/18/16 for an ultrasound which did not show anything in the sac. Pt is scheduled to come back tomorrow for a repeat ultrasound. Spoke with Selena BattenKim who stated pt needed to keep her appointment but to seek treatment if bleeding became extremely heavy. Pt verbalized understanding.

## 2016-11-26 ENCOUNTER — Encounter: Payer: Self-pay | Admitting: Advanced Practice Midwife

## 2016-11-26 ENCOUNTER — Other Ambulatory Visit: Payer: Self-pay | Admitting: Obstetrics & Gynecology

## 2016-11-26 ENCOUNTER — Ambulatory Visit (INDEPENDENT_AMBULATORY_CARE_PROVIDER_SITE_OTHER): Payer: BLUE CROSS/BLUE SHIELD

## 2016-11-26 ENCOUNTER — Ambulatory Visit: Payer: Medicaid Other | Admitting: Advanced Practice Midwife

## 2016-11-26 ENCOUNTER — Ambulatory Visit (INDEPENDENT_AMBULATORY_CARE_PROVIDER_SITE_OTHER): Payer: BLUE CROSS/BLUE SHIELD | Admitting: Advanced Practice Midwife

## 2016-11-26 VITALS — BP 90/60 | HR 72 | Wt 188.0 lb

## 2016-11-26 DIAGNOSIS — O020 Blighted ovum and nonhydatidiform mole: Secondary | ICD-10-CM | POA: Diagnosis not present

## 2016-11-26 DIAGNOSIS — O3680X0 Pregnancy with inconclusive fetal viability, not applicable or unspecified: Secondary | ICD-10-CM

## 2016-11-26 NOTE — Progress Notes (Signed)
US 6+3 wks GS 15.5 mm, NO fetal pole or ys seen,normal ov's bilat

## 2016-11-26 NOTE — Progress Notes (Signed)
[facility] Visit  Patient name: Jessica Ward MRN [id number]  Date of birth: [birth year]  CC & HPI:  Jessica Ward is a 26 y.o. Caucasian female presenting today for F/U ? Viability.  Pt started bleeding "like a period" yesterday.   Pertinent History Reviewed:  Medical & Surgical Hx:   Past Medical History:  Diagnosis Date  . High cholesterol   . Normal delivery 10/27/2012   Past Surgical History:  Procedure Laterality Date  . NO PAST SURGERIES     Family History  Problem Relation Age of Onset  . Cancer Other     leukemia, breast-maternal great grandma  . Hypertension Mother   . Diabetes Maternal Grandfather   . Coronary artery disease Maternal Grandfather   . Heart attack Maternal Grandfather   . Stroke Maternal Grandfather   . Hyperlipidemia Father   . Heart disease Father     MI 12/2014  . Heart attack Father   . Other Daughter     tubes in ears  . Other Daughter     tubes in ears    Current Outpatient Prescriptions:  .  acetaminophen (TYLENOL) 325 MG tablet, Take 650 mg by mouth every 6 (six) hours as needed for mild pain or headache. , Disp: , Rfl:  .  norethindrone (MICRONOR,CAMILA,ERRIN) 0.35 MG tablet, 1 tablet by mouth at same time daily (Patient not taking: Reported on 11/26/2016), Disp: 1 Package, Rfl: 11 Social History: Reviewed -  reports that she has never smoked. She has never used smokeless tobacco.  Review of Systems:   Constitutional: Negative for fever and chills Eyes: Negative for visual disturbances Respiratory: Negative for shortness of breath, dyspnea Cardiovascular: Negative for chest pain or palpitations  Gastrointestinal: Negative for vomiting, diarrhea and constipation; no abdominal pain Genitourinary: Negative for dysuria and urgency, vaginal irritation or itching Musculoskeletal: Negative for back pain, joint pain, myalgias  Neurological: Negative for dizziness and headaches    Objective Findings:    Physical  Examination: General appearance - well appearing, and in no distress Mental status - alert, oriented to person, place, and time Chest:  Normal respiratory effort Heart - normal rate and regular rhythm Abdomen:  Soft, nontender Musculoskeletal:  Normal range of motion without pain Extremities:  No edema  US 11/18/16:  6+2 wks GS,NO ys or fetal pole seen,normal ov's bilat,GS 13.9 mm,pt is having labs done today and f/u ultrasound in 10 days,per Victorino DikeJennifer   Today's US:  US 6+3 wks GS 15.5 mm, NO fetal pole or ys seen,normal ov's bilat  No results found for this or any previous visit (from the past 24 hour(s)).    Assessment & Plan:  A:   Blighted ovum P:  Sounds like she is startng a period. Bleeding precautions discussed. Handout given. Wants to restart POPs (still breastfeeding) when this is over.    Return in about 1 week (around 12/03/2016) for US--f/U SAB.  CRESENZO-DISHMAN,Monish Haliburton CNM 11/26/2016 11:54 AM

## 2016-12-02 ENCOUNTER — Other Ambulatory Visit: Payer: Self-pay | Admitting: Advanced Practice Midwife

## 2016-12-02 DIAGNOSIS — O3680X0 Pregnancy with inconclusive fetal viability, not applicable or unspecified: Secondary | ICD-10-CM

## 2016-12-03 ENCOUNTER — Encounter: Payer: Medicaid Other | Admitting: Obstetrics and Gynecology

## 2016-12-03 ENCOUNTER — Other Ambulatory Visit: Payer: Medicaid Other

## 2016-12-17 ENCOUNTER — Ambulatory Visit (INDEPENDENT_AMBULATORY_CARE_PROVIDER_SITE_OTHER): Payer: BLUE CROSS/BLUE SHIELD

## 2016-12-17 ENCOUNTER — Other Ambulatory Visit: Payer: Self-pay | Admitting: Advanced Practice Midwife

## 2016-12-17 ENCOUNTER — Encounter: Payer: Self-pay | Admitting: Obstetrics and Gynecology

## 2016-12-17 ENCOUNTER — Ambulatory Visit (INDEPENDENT_AMBULATORY_CARE_PROVIDER_SITE_OTHER): Payer: BLUE CROSS/BLUE SHIELD | Admitting: Obstetrics and Gynecology

## 2016-12-17 DIAGNOSIS — O034 Incomplete spontaneous abortion without complication: Secondary | ICD-10-CM | POA: Diagnosis not present

## 2016-12-17 DIAGNOSIS — O3680X0 Pregnancy with inconclusive fetal viability, not applicable or unspecified: Secondary | ICD-10-CM

## 2016-12-17 DIAGNOSIS — N83292 Other ovarian cyst, left side: Secondary | ICD-10-CM | POA: Diagnosis not present

## 2016-12-17 DIAGNOSIS — N83201 Unspecified ovarian cyst, right side: Secondary | ICD-10-CM

## 2016-12-17 DIAGNOSIS — R938 Abnormal findings on diagnostic imaging of other specified body structures: Secondary | ICD-10-CM

## 2016-12-17 DIAGNOSIS — N854 Malposition of uterus: Secondary | ICD-10-CM

## 2016-12-17 DIAGNOSIS — O021 Missed abortion: Secondary | ICD-10-CM

## 2016-12-17 DIAGNOSIS — N83291 Other ovarian cyst, right side: Secondary | ICD-10-CM | POA: Diagnosis not present

## 2016-12-17 DIAGNOSIS — N83202 Unspecified ovarian cyst, left side: Secondary | ICD-10-CM

## 2016-12-17 NOTE — Progress Notes (Signed)
.     [facility] Visit  12/17/2016           Patient name: Jessica Ward MRN [id number]  Date of birth: 10-02-1991  CC & HPI:  Jessica Ward is a 26 y.o. female presenting today for follow up of US results. Pt was seen in office on 11/26/2016 complaining of heavy vaginal bleeding while pregnant. At the time she was informed that she may be miscarrying. Pt states bleeding resolved ~ 1 week ago.   ROS:  ROS Otherwise negative for acute change except as noted in the HPI.  Pertinent History Reviewed:   Reviewed: Medical         Past Medical History:  Diagnosis Date  . High cholesterol   . Normal delivery 10/27/2012                              Surgical Hx:    Past Surgical History:  Procedure Laterality Date  . NO PAST SURGERIES     Medications: Reviewed & Updated - see associated section                       Current Outpatient Prescriptions:  .  acetaminophen (TYLENOL) 325 MG tablet, Take 650 mg by mouth every 6 (six) hours as needed for mild pain or headache. , Disp: , Rfl:  .  norethindrone (MICRONOR,CAMILA,ERRIN) 0.35 MG tablet, 1 tablet by mouth at same time daily (Patient not taking: Reported on 11/26/2016), Disp: 1 Package, Rfl: 11   Social History: Reviewed -  reports that she has never smoked. She has never used smokeless tobacco.  Objective Findings:  Vitals: Last menstrual period 09/27/2016, currently breastfeeding.  Physical Examination: General appearance - alert, well appearing, and in no distress Mental status - alert, oriented to person, place, and time Pelvic - examination not indicated   Assessment & Plan:   A:  1. Incomplete AB  P:  1. Follow up in 5 days for quat HCG 2. Consider methotrexate or office D&C   By signing my name below, I, Freida BusmanDiana Omoyeni, attest that this documentation has been prepared under the direction and in the presence of Tilda BurrowJohn V Veleda Mun, MD . Electronically Signed: Freida Busmaniana Omoyeni, Scribe. 12/17/2016. 12:12 PM. I personally  performed the services described in this documentation, which was SCRIBED in my presence. The recorded information has been reviewed and considered accurate. It has been edited as necessary during review. Tilda BurrowFERGUSON,Josette Shimabukuro V, MD

## 2016-12-17 NOTE — Progress Notes (Signed)
OB PELVIC US TV: homogeneous anteverted uterus,wnl,thickened endometrium 17.4 mm w/increased color flow within the fundal area of endometrium,suspicious for retained products of conception, bilat.hemorraghic ovarian cysts,right 5.3 x 4.2 x 4.6 cm,left 4.2 x 3.4 x 3.7 cm,bilat.ovarian arterial and venous flow was noted,left adnexal pain during ultrasound,no free fluid,unable to move left ovary w/probe pressure,right ovary appears mobile.

## 2016-12-18 LAB — BETA HCG QUANT (REF LAB): hCG Quant: 16 m[IU]/mL

## 2016-12-22 ENCOUNTER — Encounter: Payer: BLUE CROSS/BLUE SHIELD | Admitting: Obstetrics and Gynecology

## 2016-12-22 ENCOUNTER — Ambulatory Visit (INDEPENDENT_AMBULATORY_CARE_PROVIDER_SITE_OTHER): Payer: BLUE CROSS/BLUE SHIELD | Admitting: Obstetrics and Gynecology

## 2016-12-22 ENCOUNTER — Encounter: Payer: Self-pay | Admitting: Obstetrics and Gynecology

## 2016-12-22 VITALS — BP 114/60 | HR 68 | Ht 64.0 in | Wt 192.8 lb

## 2016-12-22 DIAGNOSIS — Z3202 Encounter for pregnancy test, result negative: Secondary | ICD-10-CM | POA: Diagnosis not present

## 2016-12-22 DIAGNOSIS — O039 Complete or unspecified spontaneous abortion without complication: Secondary | ICD-10-CM | POA: Insufficient documentation

## 2016-12-22 DIAGNOSIS — O034 Incomplete spontaneous abortion without complication: Secondary | ICD-10-CM | POA: Diagnosis not present

## 2016-12-22 DIAGNOSIS — O021 Missed abortion: Secondary | ICD-10-CM

## 2016-12-22 LAB — POCT URINE PREGNANCY: Preg Test, Ur: NEGATIVE

## 2016-12-22 NOTE — Progress Notes (Addendum)
Patient ID: Jessica Ward, female   DOB: 1991-03-18, 26 y.o.   MRN: [id number]   [facility]  @[facility] @            Patient name: Jessica Ward MRN [id number]  Date of birth: 1991-03-18  CC & HPI:   Chief Complaint  Patient presents with  . Follow-up    miscariage     Jessica Ward is a 26 y.o. female presenting today for f/u of missed AB. Pt was last seen in the office 12/17/16 and methotrexate vs office D&C was discussed. Beta HCG quant 5 days ago was 16 as compared to 8631 1 month ago. Pt states she is still experiencing vaginal bleeding, which began 2 days ago. She states she wants to continue OCP as her contraceptive method.   ROS:  ROS +vaginal bleeding   Pertinent History Reviewed:   Reviewed Medical         Past Medical History:  Diagnosis Date  . High cholesterol   . Normal delivery 10/27/2012                              Surgical Hx:    Past Surgical History:  Procedure Laterality Date  . NO PAST SURGERIES     Medications: Reviewed & Updated - see associated section                       Current Outpatient Prescriptions:  .  acetaminophen (TYLENOL) 325 MG tablet, Take 650 mg by mouth every 6 (six) hours as needed for mild pain or headache. , Disp: , Rfl:  .  norethindrone (MICRONOR,CAMILA,ERRIN) 0.35 MG tablet, 1 tablet by mouth at same time daily (Patient not taking: Reported on 11/26/2016), Disp: 1 Package, Rfl: 11   Social History: Reviewed -  reports that she has never smoked. She has never used smokeless tobacco.  Objective Findings:  Vitals: Blood pressure 114/60, pulse 68, height 5\' 4"  (1.626 m), weight 192 lb 12.8 oz (87.5 kg), last menstrual period 12/20/2016, currently breastfeeding.  Physical Examination: Discussion only  HCG negative today urine  Assessment & Plan:   A:  1. Completed AB 2. Urine preg negative   P:  1. Rx Camila 2. F/u PRN    By signing my name below, I, Doreatha MartinEva Mathews, attest that this documentation has been  prepared under the direction and in the presence of Tilda BurrowJohn Ashanti Ratti V, MD. Electronically Signed: Doreatha MartinEva Mathews, ED Scribe. 12/22/16. 1:40 PM.  I personally performed the services described in this documentation, which was SCRIBED in my presence. The recorded information has been reviewed and considered accurate. It has been edited as necessary during review. Tilda BurrowFERGUSON,Alahna Dunne V, MD

## 2016-12-24 ENCOUNTER — Other Ambulatory Visit: Payer: Self-pay | Admitting: Women's Health

## 2017-03-06 ENCOUNTER — Other Ambulatory Visit (HOSPITAL_COMMUNITY): Payer: Self-pay | Admitting: Registered Nurse

## 2017-03-06 ENCOUNTER — Ambulatory Visit (HOSPITAL_COMMUNITY)
Admission: RE | Admit: 2017-03-06 | Discharge: 2017-03-06 | Disposition: A | Discharge status: Home / Self Care | Payer: BLUE CROSS/BLUE SHIELD | Source: Ambulatory Visit | Attending: Registered Nurse | Admitting: Registered Nurse

## 2017-03-06 DIAGNOSIS — R198 Other specified symptoms and signs involving the digestive system and abdomen: Secondary | ICD-10-CM

## 2017-03-06 DIAGNOSIS — R110 Nausea: Secondary | ICD-10-CM

## 2017-03-06 DIAGNOSIS — R109 Unspecified abdominal pain: Secondary | ICD-10-CM

## 2017-03-06 DIAGNOSIS — N200 Calculus of kidney: Secondary | ICD-10-CM | POA: Diagnosis not present

## 2017-03-06 LAB — POCT PREGNANCY, URINE: Preg Test, Ur: NEGATIVE

## 2017-03-06 MED ORDER — IOPAMIDOL (ISOVUE-300) INJECTION 61%
30.0000 mL | Freq: Once | INTRAVENOUS | Status: AC | PRN
  Administered 2017-03-06: 30 mL via ORAL

## 2017-03-06 MED ORDER — IOPAMIDOL (ISOVUE-300) INJECTION 61%
100.0000 mL | Freq: Once | INTRAVENOUS | Status: AC | PRN
  Administered 2017-03-06: 100 mL via INTRAVENOUS

## 2017-06-09 ENCOUNTER — Encounter: Payer: Self-pay | Admitting: Adult Health

## 2017-06-09 ENCOUNTER — Ambulatory Visit (INDEPENDENT_AMBULATORY_CARE_PROVIDER_SITE_OTHER): Payer: Self-pay | Admitting: Adult Health

## 2017-06-09 VITALS — BP 122/68 | HR 86 | Ht 64.0 in | Wt 203.0 lb

## 2017-06-09 DIAGNOSIS — Z3A01 Less than 8 weeks gestation of pregnancy: Secondary | ICD-10-CM

## 2017-06-09 DIAGNOSIS — Z3201 Encounter for pregnancy test, result positive: Secondary | ICD-10-CM

## 2017-06-09 DIAGNOSIS — N926 Irregular menstruation, unspecified: Secondary | ICD-10-CM

## 2017-06-09 DIAGNOSIS — O3680X0 Pregnancy with inconclusive fetal viability, not applicable or unspecified: Secondary | ICD-10-CM

## 2017-06-09 LAB — POCT URINE PREGNANCY: PREG TEST UR: POSITIVE — AB

## 2017-06-09 MED ORDER — FLINTSTONES COMPLETE 60 MG PO CHEW: 1.0000 | CHEWABLE_TABLET | Freq: Every day | ORAL | Status: DC

## 2017-06-09 NOTE — Progress Notes (Signed)
Subjective:     Patient ID: Jessica Ward, female   DOB: [birth year], 26 y.o.   MRN: [id number]  HPI Jessica Ward is a 26 year old white female in for UPT, has missed a period and had 1+HPT, and some nausea.  Review of Systems +missed period with 1 +HPT Nausea Reviewed past medical,surgical, social and family history. Reviewed medications and allergies.     Objective:   Physical Exam BP 122/68 (BP Location: Left Arm, Patient Position: Sitting, Cuff Size: Large)   Pulse 86   Ht 5\' 4"  (1.626 m)   Wt 203 lb (92.1 kg)   LMP 04/30/2017   Breastfeeding? No   BMI 34.84 kg/m UPT+, about 5+5 weeks, with EDD 02/04/18.Skin warm and dry. Neck: mid line trachea, normal thyroid, good ROM, no lymphadenopathy noted. Lungs: clear to ausculation bilaterally. Cardiovascular: regular rate and rhythm.Abdomen is soft and non tender.    Assessment:     1. Pregnancy examination or test, positive result   2. Less than [redacted] weeks gestation of pregnancy   3. Encounter to determine fetal viability of pregnancy, single or unspecified fetus       Plan:     Meds ordered this encounter  Medications  . flintstones complete (FLINTSTONES) 60 MG chewable tablet    Sig: Chew 1 tablet by mouth daily.    Order Specific Question:   Supervising Provider    Answer:   Lazaro Arms [[id number]]  Return in 2 weeks for dating [place] Review handout on first trimester and by [facility]   Eat often

## 2017-06-09 NOTE — Patient Instructions (Signed)
First Trimester of Pregnancy The first trimester of pregnancy is from week 1 until the end of week 13 (months 1 through 3). A week after a sperm fertilizes an egg, the egg will implant on the wall of the uterus. This embryo will begin to develop into a baby. Genes from you and your partner will form the baby. The female genes will determine whether the baby will be a boy or a girl. At 6-8 weeks, the eyes and face will be formed, and the heartbeat can be seen on ultrasound. At the end of 12 weeks, all the baby's organs will be formed. Now that you are pregnant, you will want to do everything you can to have a healthy baby. Two of the most important things are to get good prenatal care and to follow your health care provider's instructions. Prenatal care is all the medical care you receive before the baby's birth. This care will help prevent, find, and treat any problems during the pregnancy and childbirth. Body changes during your first trimester Your body goes through many changes during pregnancy. The changes vary from woman to woman.  You may gain or lose a couple of pounds at first.  You may feel sick to your stomach (nauseous) and you may throw up (vomit). If the vomiting is uncontrollable, call your health care provider.  You may tire easily.  You may develop headaches that can be relieved by medicines. All medicines should be approved by your health care provider.  You may urinate more often. Painful urination may mean you have a bladder infection.  You may develop heartburn as a result of your pregnancy.  You may develop constipation because certain hormones are causing the muscles that push stool through your intestines to slow down.  You may develop hemorrhoids or swollen veins (varicose veins).  Your breasts may begin to grow larger and become tender. Your nipples may stick out more, and the tissue that surrounds them (areola) may become darker.  Your gums may bleed and may be  sensitive to brushing and flossing.  Dark spots or blotches (chloasma, mask of pregnancy) may develop on your face. This will likely fade after the baby is born.  Your menstrual periods will stop.  You may have a loss of appetite.  You may develop cravings for certain kinds of food.  You may have changes in your emotions from day to day, such as being excited to be pregnant or being concerned that something may go wrong with the pregnancy and baby.  You may have more vivid and strange dreams.  You may have changes in your hair. These can include thickening of your hair, rapid growth, and changes in texture. Some women also have hair loss during or after pregnancy, or hair that feels dry or thin. Your hair will most likely return to normal after your baby is born.  What to expect at prenatal visits During a routine prenatal visit:  You will be weighed to make sure you and the baby are growing normally.  Your blood pressure will be taken.  Your abdomen will be measured to track your baby's growth.  The fetal heartbeat will be listened to between weeks 10 and 14 of your pregnancy.  Test results from any previous visits will be discussed.  Your health care provider may ask you:  How you are feeling.  If you are feeling the baby move.  If you have had any abnormal symptoms, such as leaking fluid, bleeding, severe headaches,   or abdominal cramping.  If you are using any tobacco products, including cigarettes, chewing tobacco, and electronic cigarettes.  If you have any questions.  Other tests that may be performed during your first trimester include:  Blood tests to find your blood type and to check for the presence of any previous infections. The tests will also be used to check for low iron levels (anemia) and protein on red blood cells (Rh antibodies). Depending on your risk factors, or if you previously had diabetes during pregnancy, you may have tests to check for high blood  sugar that affects pregnant women (gestational diabetes).  Urine tests to check for infections, diabetes, or protein in the urine.  An ultrasound to confirm the proper growth and development of the baby.  Fetal screens for spinal cord problems (spina bifida) and Down syndrome.  HIV (human immunodeficiency virus) testing. Routine prenatal testing includes screening for HIV, unless you choose not to have this test.  You may need other tests to make sure you and the baby are doing well.  Follow these instructions at home: Medicines  Follow your health care provider's instructions regarding medicine use. Specific medicines may be either safe or unsafe to take during pregnancy.  Take a prenatal vitamin that contains at least 600 micrograms (mcg) of folic acid.  If you develop constipation, try taking a stool softener if your health care provider approves. Eating and drinking  Eat a balanced diet that includes fresh fruits and vegetables, whole grains, good sources of protein such as meat, eggs, or tofu, and low-fat dairy. Your health care provider will help you determine the amount of weight gain that is right for you.  Avoid raw meat and uncooked cheese. These carry germs that can cause birth defects in the baby.  Eating four or five small meals rather than three large meals a day may help relieve nausea and vomiting. If you start to feel nauseous, eating a few soda crackers can be helpful. Drinking liquids between meals, instead of during meals, also seems to help ease nausea and vomiting.  Limit foods that are high in fat and processed sugars, such as fried and sweet foods.  To prevent constipation: ? Eat foods that are high in fiber, such as fresh fruits and vegetables, whole grains, and beans. ? Drink enough fluid to keep your urine clear or pale yellow. Activity  Exercise only as directed by your health care provider. Most women can continue their usual exercise routine during  pregnancy. Try to exercise for 30 minutes at least 5 days a week. Exercising will help you: ? Control your weight. ? Stay in shape. ? Be prepared for labor and delivery.  Experiencing pain or cramping in the lower abdomen or lower back is a good sign that you should stop exercising. Check with your health care provider before continuing with normal exercises.  Try to avoid standing for long periods of time. Move your legs often if you must stand in one place for a long time.  Avoid heavy lifting.  Wear low-heeled shoes and practice good posture.  You may continue to have sex unless your health care provider tells you not to. Relieving pain and discomfort  Wear a good support bra to relieve breast tenderness.  Take warm sitz baths to soothe any pain or discomfort caused by hemorrhoids. Use hemorrhoid cream if your health care provider approves.  Rest with your legs elevated if you have leg cramps or low back pain.  If you develop   varicose veins in your legs, wear support hose. Elevate your feet for 15 minutes, 3-4 times a day. Limit salt in your diet. Prenatal care  Schedule your prenatal visits by the twelfth week of pregnancy. They are usually scheduled monthly at first, then more often in the last 2 months before delivery.  Write down your questions. Take them to your prenatal visits.  Keep all your prenatal visits as told by your health care provider. This is important. Safety  Wear your seat belt at all times when driving.  Make a list of emergency phone numbers, including numbers for family, friends, the hospital, and police and fire departments. General instructions  Ask your health care provider for a referral to a local prenatal education class. Begin classes no later than the beginning of month 6 of your pregnancy.  Ask for help if you have counseling or nutritional needs during pregnancy. Your health care provider can offer advice or refer you to specialists for help  with various needs.  Do not use hot tubs, steam rooms, or saunas.  Do not douche or use tampons or scented sanitary pads.  Do not cross your legs for long periods of time.  Avoid cat litter boxes and soil used by cats. These carry germs that can cause birth defects in the baby and possibly loss of the fetus by miscarriage or stillbirth.  Avoid all smoking, herbs, alcohol, and medicines not prescribed by your health care provider. Chemicals in these products affect the formation and growth of the baby.  Do not use any products that contain nicotine or tobacco, such as cigarettes and e-cigarettes. If you need help quitting, ask your health care provider. You may receive counseling support and other resources to help you quit.  Schedule a dentist appointment. At home, brush your teeth with a soft toothbrush and be gentle when you floss. Contact a health care provider if:  You have dizziness.  You have mild pelvic cramps, pelvic pressure, or nagging pain in the abdominal area.  You have persistent nausea, vomiting, or diarrhea.  You have a bad smelling vaginal discharge.  You have pain when you urinate.  You notice increased swelling in your face, hands, legs, or ankles.  You are exposed to fifth disease or chickenpox.  You are exposed to German measles (rubella) and have never had it. Get help right away if:  You have a fever.  You are leaking fluid from your vagina.  You have spotting or bleeding from your vagina.  You have severe abdominal cramping or pain.  You have rapid weight gain or loss.  You vomit blood or material that looks like coffee grounds.  You develop a severe headache.  You have shortness of breath.  You have any kind of trauma, such as from a fall or a car accident. Summary  The first trimester of pregnancy is from week 1 until the end of week 13 (months 1 through 3).  Your body goes through many changes during pregnancy. The changes vary from  woman to woman.  You will have routine prenatal visits. During those visits, your health care provider will examine you, discuss any test results you may have, and talk with you about how you are feeling. This information is not intended to replace advice given to you by your health care provider. Make sure you discuss any questions you have with your health care provider. Document Released: 09/30/2001 Document Revised: 09/17/2016 Document Reviewed: 09/17/2016 Elsevier Interactive Patient Education  2017 Elsevier   Inc.  

## 2017-06-15 ENCOUNTER — Other Ambulatory Visit: Payer: Self-pay

## 2017-06-15 ENCOUNTER — Telehealth: Payer: Self-pay | Admitting: Adult Health

## 2017-06-15 DIAGNOSIS — O09299 Supervision of pregnancy with other poor reproductive or obstetric history, unspecified trimester: Secondary | ICD-10-CM

## 2017-06-15 NOTE — Telephone Encounter (Signed)
Spoke with pt. Pt had some spotting Saturday. No spotting now. Also having pain in left side. No BM or sex Saturday. Pt has [place] scheduled for 06/24/17. I spoke with JAG and she advised can order quant and progesterone labs. I let pt know and she says her Medicaid hasn't went through. Pt wondered the cost of the labs. I advised to call [facility] and gave number to call. Pt to let me know if she will do labs and when after talking to [employer]. [employer]

## 2017-06-15 NOTE — Telephone Encounter (Signed)
Patient called stating that she seen Jessica Ward on Friday and she was told she is [redacted] weeks Pregnant Pt states that she had a miscarriage in feb and she was told if she had any bleeding to contact [facility]. Pt states she started bleeding this weekend. Please contact pt

## 2017-06-15 NOTE — Telephone Encounter (Signed)
Pt to have quant and progesterone labs today. JSY

## 2017-06-16 ENCOUNTER — Telehealth: Payer: Self-pay | Admitting: *Deleted

## 2017-06-16 LAB — PROGESTERONE: Progesterone: 14.3 ng/mL

## 2017-06-16 LAB — BETA HCG QUANT (REF LAB): HCG QUANT: 28471 m[IU]/mL

## 2017-06-16 NOTE — Telephone Encounter (Signed)
Pt informed of lab results. 

## 2017-06-17 ENCOUNTER — Ambulatory Visit (INDEPENDENT_AMBULATORY_CARE_PROVIDER_SITE_OTHER): Payer: Medicaid Other

## 2017-06-17 DIAGNOSIS — O3680X0 Pregnancy with inconclusive fetal viability, not applicable or unspecified: Secondary | ICD-10-CM

## 2017-06-17 NOTE — Progress Notes (Addendum)
US 6+6 wks,single IUP w/ys,positive FHT 135 bpm,subchorionic hemorrhage 1.6 x .6 x 1.2 cm,simple left corpus luteal cyst 2.5 x 2.2 x 2.4 cm,normal right ovary,crl 7.67 mm

## 2017-06-24 ENCOUNTER — Other Ambulatory Visit: Payer: Self-pay

## 2017-07-01 ENCOUNTER — Encounter: Payer: Self-pay | Admitting: Advanced Practice Midwife

## 2017-07-01 ENCOUNTER — Ambulatory Visit: Payer: Medicaid Other | Admitting: *Deleted

## 2017-07-01 ENCOUNTER — Ambulatory Visit (INDEPENDENT_AMBULATORY_CARE_PROVIDER_SITE_OTHER): Payer: Medicaid Other | Admitting: Advanced Practice Midwife

## 2017-07-01 VITALS — BP 90/60 | HR 80 | Wt 200.0 lb

## 2017-07-01 DIAGNOSIS — Z3A08 8 weeks gestation of pregnancy: Secondary | ICD-10-CM

## 2017-07-01 DIAGNOSIS — Z1389 Encounter for screening for other disorder: Secondary | ICD-10-CM

## 2017-07-01 DIAGNOSIS — Z331 Pregnant state, incidental: Secondary | ICD-10-CM

## 2017-07-01 DIAGNOSIS — Z3682 Encounter for antenatal screening for nuchal translucency: Secondary | ICD-10-CM

## 2017-07-01 DIAGNOSIS — Z3481 Encounter for supervision of other normal pregnancy, first trimester: Secondary | ICD-10-CM | POA: Diagnosis not present

## 2017-07-01 DIAGNOSIS — Z3490 Encounter for supervision of normal pregnancy, unspecified, unspecified trimester: Secondary | ICD-10-CM | POA: Insufficient documentation

## 2017-07-01 LAB — POCT URINALYSIS DIPSTICK
Glucose, UA: NEGATIVE
Ketones, UA: NEGATIVE
Leukocytes, UA: NEGATIVE
Nitrite, UA: NEGATIVE
PROTEIN UA: NEGATIVE
RBC UA: NEGATIVE

## 2017-07-01 NOTE — Progress Notes (Signed)
Subjective:    Jessica Ward is a [id number] 3570w6d being seen today for her first obstetrical visit.  Her obstetrical history is significant for SVDd x 3 at term.  Pregnancy history fully reviewed.  Patient reports nausea.  Vitals:   07/01/17 1115  BP: 90/60  Pulse: 80  Weight: 200 lb (90.7 kg)    HISTORY: OB History  Gravida Para Term Preterm AB Living  5 3 3   1 3   SAB TAB Ectopic Multiple Live Births  1     0 3    # Outcome Date GA Lbr Len/2nd Weight Sex Delivery Anes PTL Lv  5 Current           4 SAB 11/2016          3 Term 08/14/15 51106w0d 09:37 / 00:16 7 lb 9.3 oz (3.44 kg) M Vag-Spont EPI N LIV  2 Term 10/27/12 70370w6d 16:10 / 00:09 7 lb 0.4 oz (3.185 kg) F Vag-Spont EPI N LIV  1 Term 12/02/08 26106w0d  7 lb 2 oz (3.232 kg) F Vag-Spont EPI N LIV     Past Medical History:  Diagnosis Date  . High cholesterol   . Miscarriage   . Normal delivery 10/27/2012   Past Surgical History:  Procedure Laterality Date  . NO PAST SURGERIES     Family History  Problem Relation Age of Onset  . Cancer Other        leukemia, breast-maternal great grandma  . Hypertension Mother   . Diabetes Maternal Grandfather   . Coronary artery disease Maternal Grandfather   . Heart attack Maternal Grandfather   . Stroke Maternal Grandfather   . Hyperlipidemia Father   . Heart disease Father        MI 12/2014  . Heart attack Father   . Other Daughter        tubes in ears  . Other Daughter        tubes in ears  . Hypercholesterolemia Paternal Aunt      Exam       Pelvic Exam:    Perineum: Normal Perineum   Vulva: normal   Vagina:  normal mucosa, normal discharge, no palpable nodules   Uterus Normal, Gravid, FH: 8     Cervix: normal   Adnexa: Not palpable   Urinary:  urethral meatus normal    System:     Skin: normal coloration and turgor, no rashes    Neurologic: oriented, normal, normal mood   Extremities: normal strength, tone, and muscle mass   HEENT PERRLA   Mouth/Teeth  mucous membranes moist, NORMAL dentition   Neck supple and no masses   Cardiovascular: regular rate and rhythm   Respiratory:  appears well, vitals normal, no respiratory distress, acyanotic   Abdomen: soft, non-tender;  FHR: 160 us          Assessment:    Pregnancy: [id number] Patient Active Problem List   Diagnosis Date Noted  . Supervision of normal pregnancy 07/01/2017  . Spontaneous abortion without complication 12/22/2016  . Rubella non-immune status, antepartum 02/05/2015        Plan:     Initial labs drawn. bONJESTA SAMPLES (MEDICAID NOT ACTIVE YET) Continue prenatal vitamins  Problem list reviewed and updated  Reviewed n/v relief measures and warning s/s to report  Reviewed recommended weight gain based on pre-gravid BMI  Encouraged well-balanced diet Genetic Screening discussed Integrated Screen: requested.  Ultrasound discussed; fetal survey: requested.  Return in about 4 weeks (around 07/29/2017)  for LROB, US:NT+1st IT.  Jessica Ward,Jessica Ward 07/01/2017

## 2017-07-01 NOTE — Patient Instructions (Signed)
 First Trimester of Pregnancy The first trimester of pregnancy is from week 1 until the end of week 12 (months 1 through 3). A week after a sperm fertilizes an egg, the egg will implant on the wall of the uterus. This embryo will begin to develop into a baby. Genes from you and your partner are forming the baby. The female genes determine whether the baby is a boy or a girl. At 6-8 weeks, the eyes and face are formed, and the heartbeat can be seen on ultrasound. At the end of 12 weeks, all the baby's organs are formed.  Now that you are pregnant, you will want to do everything you can to have a healthy baby. Two of the most important things are to get good prenatal care and to follow your health care provider's instructions. Prenatal care is all the medical care you receive before the baby's birth. This care will help prevent, find, and treat any problems during the pregnancy and childbirth. BODY CHANGES Your body goes through many changes during pregnancy. The changes vary from woman to woman.   You may gain or lose a couple of pounds at first.  You may feel sick to your stomach (nauseous) and throw up (vomit). If the vomiting is uncontrollable, call your health care provider.  You may tire easily.  You may develop headaches that can be relieved by medicines approved by your health care provider.  You may urinate more often. Painful urination may mean you have a bladder infection.  You may develop heartburn as a result of your pregnancy.  You may develop constipation because certain hormones are causing the muscles that push waste through your intestines to slow down.  You may develop hemorrhoids or swollen, bulging veins (varicose veins).  Your breasts may begin to grow larger and become tender. Your nipples may stick out more, and the tissue that surrounds them (areola) may become darker.  Your gums may bleed and may be sensitive to brushing and flossing.  Dark spots or blotches  (chloasma, mask of pregnancy) may develop on your face. This will likely fade after the baby is born.  Your menstrual periods will stop.  You may have a loss of appetite.  You may develop cravings for certain kinds of food.  You may have changes in your emotions from day to day, such as being excited to be pregnant or being concerned that something may go wrong with the pregnancy and baby.  You may have more vivid and strange dreams.  You may have changes in your hair. These can include thickening of your hair, rapid growth, and changes in texture. Some women also have hair loss during or after pregnancy, or hair that feels dry or thin. Your hair will most likely return to normal after your baby is born. WHAT TO EXPECT AT YOUR PRENATAL VISITS During a routine prenatal visit:  You will be weighed to make sure you and the baby are growing normally.  Your blood pressure will be taken.  Your abdomen will be measured to track your baby's growth.  The fetal heartbeat will be listened to starting around week 10 or 12 of your pregnancy.  Test results from any previous visits will be discussed. Your health care provider may ask you:  How you are feeling.  If you are feeling the baby move.  If you have had any abnormal symptoms, such as leaking fluid, bleeding, severe headaches, or abdominal cramping.  If you have any questions. Other   tests that may be performed during your first trimester include:  Blood tests to find your blood type and to check for the presence of any previous infections. They will also be used to check for low iron levels (anemia) and Rh antibodies. Later in the pregnancy, blood tests for diabetes will be done along with other tests if problems develop.  Urine tests to check for infections, diabetes, or protein in the urine.  An ultrasound to confirm the proper growth and development of the baby.  An amniocentesis to check for possible genetic problems.  Fetal  screens for spina bifida and Down syndrome.  You may need other tests to make sure you and the baby are doing well. HOME CARE INSTRUCTIONS  Medicines  Follow your health care provider's instructions regarding medicine use. Specific medicines may be either safe or unsafe to take during pregnancy.  Take your prenatal vitamins as directed.  If you develop constipation, try taking a stool softener if your health care provider approves. Diet  Eat regular, well-balanced meals. Choose a variety of foods, such as meat or vegetable-based protein, fish, milk and low-fat dairy products, vegetables, fruits, and whole grain breads and cereals. Your health care provider will help you determine the amount of weight gain that is right for you.  Avoid raw meat and uncooked cheese. These carry germs that can cause birth defects in the baby.  Eating four or five small meals rather than three large meals a day may help relieve nausea and vomiting. If you start to feel nauseous, eating a few soda crackers can be helpful. Drinking liquids between meals instead of during meals also seems to help nausea and vomiting.  If you develop constipation, eat more high-fiber foods, such as fresh vegetables or fruit and whole grains. Drink enough fluids to keep your urine clear or pale yellow. Activity and Exercise  Exercise only as directed by your health care provider. Exercising will help you:  Control your weight.  Stay in shape.  Be prepared for labor and delivery.  Experiencing pain or cramping in the lower abdomen or low back is a good sign that you should stop exercising. Check with your health care provider before continuing normal exercises.  Try to avoid standing for long periods of time. Move your legs often if you must stand in one place for a long time.  Avoid heavy lifting.  Wear low-heeled shoes, and practice good posture.  You may continue to have sex unless your health care provider directs you  otherwise. Relief of Pain or Discomfort  Wear a good support bra for breast tenderness.   Take warm sitz baths to soothe any pain or discomfort caused by hemorrhoids. Use hemorrhoid cream if your health care provider approves.   Rest with your legs elevated if you have leg cramps or low back pain.  If you develop varicose veins in your legs, wear support hose. Elevate your feet for 15 minutes, 3-4 times a day. Limit salt in your diet. Prenatal Care  Schedule your prenatal visits by the twelfth week of pregnancy. They are usually scheduled monthly at first, then more often in the last 2 months before delivery.  Write down your questions. Take them to your prenatal visits.  Keep all your prenatal visits as directed by your health care provider. Safety  Wear your seat belt at all times when driving.  Make a list of emergency phone numbers, including numbers for family, friends, the hospital, and police and fire departments. General   Tips  Ask your health care provider for a referral to a local prenatal education class. Begin classes no later than at the beginning of month 6 of your pregnancy.  Ask for help if you have counseling or nutritional needs during pregnancy. Your health care provider can offer advice or refer you to specialists for help with various needs.  Do not use hot tubs, steam rooms, or saunas.  Do not douche or use tampons or scented sanitary pads.  Do not cross your legs for long periods of time.  Avoid cat litter boxes and soil used by cats. These carry germs that can cause birth defects in the baby and possibly loss of the fetus by miscarriage or stillbirth.  Avoid all smoking, herbs, alcohol, and medicines not prescribed by your health care provider. Chemicals in these affect the formation and growth of the baby.  Schedule a dentist appointment. At home, brush your teeth with a soft toothbrush and be gentle when you floss. SEEK MEDICAL CARE IF:   You have  dizziness.  You have mild pelvic cramps, pelvic pressure, or nagging pain in the abdominal area.  You have persistent nausea, vomiting, or diarrhea.  You have a bad smelling vaginal discharge.  You have pain with urination.  You notice increased swelling in your face, hands, legs, or ankles. SEEK IMMEDIATE MEDICAL CARE IF:   You have a fever.  You are leaking fluid from your vagina.  You have spotting or bleeding from your vagina.  You have severe abdominal cramping or pain.  You have rapid weight gain or loss.  You vomit blood or material that looks like coffee grounds.  You are exposed to German measles and have never had them.  You are exposed to fifth disease or chickenpox.  You develop a severe headache.  You have shortness of breath.  You have any kind of trauma, such as from a fall or a car accident. Document Released: 09/30/2001 Document Revised: 02/20/2014 Document Reviewed: 08/16/2013 ExitCare Patient Information 2015 [employer]. This information is not intended to replace advice given to you by your health care provider. Make sure you discuss any questions you have with your health care provider.   Nausea & Vomiting  Have saltine crackers or pretzels by your bed and eat a few bites before you raise your head out of bed in the morning  Eat small frequent meals throughout the day instead of large meals  Drink plenty of fluids throughout the day to stay hydrated, just don't drink a lot of fluids with your meals.  This can make your stomach fill up faster making you feel sick  Do not brush your teeth right after you eat  Products with real ginger are good for nausea, like ginger ale and ginger hard candy Make sure it says made with real ginger!  Sucking on sour candy like lemon heads is also good for nausea  If your prenatal vitamins make you nauseated, take them at night so you will sleep through the nausea  Sea Bands  If you feel like you need  medicine for the nausea & vomiting please let us know  If you are unable to keep any fluids or food down please let us know   Constipation  Drink plenty of fluid, preferably water, throughout the day  Eat foods high in fiber such as fruits, vegetables, and grains  Exercise, such as walking, is a good way to keep your bowels regular  Drink warm fluids, especially warm   prune juice, or decaf coffee  Eat a 1/2 cup of real oatmeal (not instant), 1/2 cup applesauce, and 1/2-1 cup warm prune juice every day  If needed, you may take Colace (docusate sodium) stool softener once or twice a day to help keep the stool soft. If you are pregnant, wait until you are out of your first trimester (12-14 weeks of pregnancy)  If you still are having problems with constipation, you may take Miralax once daily as needed to help keep your bowels regular.  If you are pregnant, wait until you are out of your first trimester (12-14 weeks of pregnancy)  Safe Medications in Pregnancy   Acne: Benzoyl Peroxide Salicylic Acid  Backache/Headache: Tylenol: 2 regular strength every 4 hours OR              2 Extra strength every 6 hours  Colds/Coughs/Allergies: Benadryl (alcohol free) 25 mg every 6 hours as needed Breath right strips Claritin Cepacol throat lozenges Chloraseptic throat spray Cold-Eeze- up to three times per day Cough drops, alcohol free Flonase (by prescription only) Guaifenesin Mucinex Robitussin DM (plain only, alcohol free) Saline nasal spray/drops Sudafed (pseudoephedrine) & Actifed ** use only after [redacted] weeks gestation and if you do not have high blood pressure Tylenol Vicks Vaporub Zinc lozenges Zyrtec   Constipation: Colace Ducolax suppositories Fleet enema Glycerin suppositories Metamucil Milk of magnesia Miralax Senokot Smooth move tea  Diarrhea: Kaopectate Imodium A-D  *NO pepto Bismol  Hemorrhoids: Anusol Anusol HC Preparation  H Tucks  Indigestion: Tums Maalox Mylanta Zantac  Pepcid  Insomnia: Benadryl (alcohol free) 25mg every 6 hours as needed Tylenol PM Unisom, no Gelcaps  Leg Cramps: Tums MagGel  Nausea/Vomiting:  Bonine Dramamine Emetrol Ginger extract Sea bands Meclizine  Nausea medication to take during pregnancy:  Unisom (doxylamine succinate 25 mg tablets) Take one tablet daily at bedtime. If symptoms are not adequately controlled, the dose can be increased to a maximum recommended dose of two tablets daily (1/2 tablet in the morning, 1/2 tablet mid-afternoon and one at bedtime). Vitamin B6 100mg tablets. Take one tablet twice a day (up to 200 mg per day).  Skin Rashes: Aveeno products Benadryl cream or 25mg every 6 hours as needed Calamine Lotion 1% cortisone cream  Yeast infection: Gyne-lotrimin 7 Monistat 7   **If taking multiple medications, please check labels to avoid duplicating the same active ingredients **take medication as directed on the label ** Do not exceed 4000 mg of tylenol in 24 hours **Do not take medications that contain aspirin or ibuprofen      

## 2017-07-02 LAB — URINALYSIS, ROUTINE W REFLEX MICROSCOPIC
Bilirubin, UA: NEGATIVE
Glucose, UA: NEGATIVE
KETONES UA: NEGATIVE
LEUKOCYTES UA: NEGATIVE
NITRITE UA: NEGATIVE
Protein, UA: NEGATIVE
RBC, UA: NEGATIVE
SPEC GRAV UA: 1.027 (ref 1.005–1.030)
Urobilinogen, Ur: 0.2 mg/dL (ref 0.2–1.0)
pH, UA: 5.5 (ref 5.0–7.5)

## 2017-07-02 LAB — PMP SCREEN PROFILE (10S), URINE
Amphetamine Scrn, Ur: NEGATIVE ng/mL
BARBITURATE SCREEN URINE: NEGATIVE ng/mL
BENZODIAZEPINE SCREEN, URINE: NEGATIVE ng/mL
CANNABINOIDS UR QL SCN: NEGATIVE ng/mL
CREATININE(CRT), U: 154 mg/dL (ref 20.0–300.0)
Cocaine (Metab) Scrn, Ur: NEGATIVE ng/mL
METHADONE SCREEN, URINE: NEGATIVE ng/mL
OPIATE SCREEN URINE: NEGATIVE ng/mL
OXYCODONE+OXYMORPHONE UR QL SCN: NEGATIVE ng/mL
PH UR, DRUG SCRN: 5.8 (ref 4.5–8.9)
PROPOXYPHENE SCREEN URINE: NEGATIVE ng/mL
Phencyclidine Qn, Ur: NEGATIVE ng/mL

## 2017-07-02 LAB — HIV ANTIBODY (ROUTINE TESTING W REFLEX): HIV SCREEN 4TH GENERATION: NONREACTIVE

## 2017-07-02 LAB — CBC
Hematocrit: 37.8 % (ref 34.0–46.6)
Hemoglobin: 13.1 g/dL (ref 11.1–15.9)
MCH: 30 pg (ref 26.6–33.0)
MCHC: 34.7 g/dL (ref 31.5–35.7)
MCV: 87 fL (ref 79–97)
PLATELETS: 273 10*3/uL (ref 150–379)
RBC: 4.37 x10E6/uL (ref 3.77–5.28)
RDW: 13.4 % (ref 12.3–15.4)
WBC: 6.2 10*3/uL (ref 3.4–10.8)

## 2017-07-02 LAB — MED LIST OPTION NOT SELECTED

## 2017-07-02 LAB — RPR: RPR: NONREACTIVE

## 2017-07-02 LAB — HEPATITIS B SURFACE ANTIGEN: Hepatitis B Surface Ag: NEGATIVE

## 2017-07-02 LAB — ANTIBODY SCREEN: Antibody Screen: NEGATIVE

## 2017-07-02 LAB — RUBELLA SCREEN: Rubella Antibodies, IGG: <0.9 index — ABNORMAL LOW (ref >0.99)

## 2017-07-03 LAB — GC/CHLAMYDIA PROBE AMP
Chlamydia trachomatis, NAA: NEGATIVE
NEISSERIA GONORRHOEAE BY PCR: NEGATIVE

## 2017-07-03 LAB — URINE CULTURE

## 2017-07-07 ENCOUNTER — Telehealth: Payer: Self-pay | Admitting: *Deleted

## 2017-07-07 NOTE — Telephone Encounter (Signed)
Pt called stating that Drenda Freeze had given her samples of Bonjesta and she is now out and would like a prescription sent to [facility] pharmacy for this. Advised pt that I would send her request to a provider and she should check with her pharmacy in the next 24 hours.

## 2017-07-08 MED ORDER — DOXYLAMINE-PYRIDOXINE ER 20-20 MG PO TBCR: 1.0000 | EXTENDED_RELEASE_TABLET | Freq: Every day | ORAL | 8 refills | Status: DC

## 2017-07-10 ENCOUNTER — Telehealth: Payer: Self-pay | Admitting: *Deleted

## 2017-07-10 NOTE — Telephone Encounter (Signed)
PA done by Tish. Pt aware that it's done and she should be able to get med today. Pt voiced understanding. JSY

## 2017-07-15 ENCOUNTER — Telehealth: Payer: Self-pay | Admitting: *Deleted

## 2017-07-15 NOTE — Telephone Encounter (Signed)
Pharmacy notified and PA was done and approved

## 2017-07-29 ENCOUNTER — Encounter: Payer: Medicaid Other | Admitting: Advanced Practice Midwife

## 2017-07-29 ENCOUNTER — Encounter: Payer: Self-pay | Admitting: Advanced Practice Midwife

## 2017-07-29 ENCOUNTER — Other Ambulatory Visit: Payer: Medicaid Other

## 2017-07-29 ENCOUNTER — Ambulatory Visit (INDEPENDENT_AMBULATORY_CARE_PROVIDER_SITE_OTHER): Payer: Medicaid Other | Admitting: Advanced Practice Midwife

## 2017-07-29 ENCOUNTER — Ambulatory Visit (INDEPENDENT_AMBULATORY_CARE_PROVIDER_SITE_OTHER): Payer: Medicaid Other

## 2017-07-29 VITALS — BP 118/80 | Wt 204.0 lb

## 2017-07-29 DIAGNOSIS — Z3682 Encounter for antenatal screening for nuchal translucency: Secondary | ICD-10-CM | POA: Diagnosis not present

## 2017-07-29 DIAGNOSIS — Z3481 Encounter for supervision of other normal pregnancy, first trimester: Secondary | ICD-10-CM

## 2017-07-29 DIAGNOSIS — Z3A12 12 weeks gestation of pregnancy: Secondary | ICD-10-CM

## 2017-07-29 DIAGNOSIS — Z1379 Encounter for other screening for genetic and chromosomal anomalies: Secondary | ICD-10-CM

## 2017-07-29 DIAGNOSIS — Z3402 Encounter for supervision of normal first pregnancy, second trimester: Secondary | ICD-10-CM

## 2017-07-29 DIAGNOSIS — Z331 Pregnant state, incidental: Secondary | ICD-10-CM

## 2017-07-29 DIAGNOSIS — Z1389 Encounter for screening for other disorder: Secondary | ICD-10-CM

## 2017-07-29 LAB — POCT URINALYSIS DIPSTICK
Blood, UA: NEGATIVE
Glucose, UA: NEGATIVE
Ketones, UA: NEGATIVE
LEUKOCYTES UA: NEGATIVE
NITRITE UA: NEGATIVE
Protein, UA: NEGATIVE

## 2017-07-29 NOTE — Progress Notes (Signed)
[id number] [redacted]w[redacted]d Estimated Date of Delivery: 02/04/18  Blood pressure 118/80, weight 204 lb (92.5 kg), last menstrual period 04/30/2017, not currently breastfeeding.   BP weight and urine results all reviewed and noted.  Please refer to the obstetrical flow sheet for the fundal height and fetal heart rate documentation:  [place] 12+6 wks,measurements c/w dates,NB present,NT 1.3 mm,crl 70.77 mm,normal ovaries bilat,fhr 166 bpm  Patient reports denies any bleeding and no rupture of membranes symptoms or regular contractions. Patient is without complaints. All questions were answered.  Orders Placed This Encounter  Procedures  . Integrated 1  . POCT urinalysis dipstick    Plan:  Continued routine obstetrical care,   Return in about 4 weeks (around 08/26/2017) for LROB.

## 2017-07-29 NOTE — Progress Notes (Signed)
[place] 12+6 wks,measurements c/w dates,NB present,NT 1.3 mm,crl 70.77 mm,normal ovaries bilat,fhr 166 bpm

## 2017-08-03 LAB — INTEGRATED 1
Crown Rump Length: 70.8 mm
Gest. Age on Collection Date: 13 weeks
MATERNAL AGE AT EDD: 24.4 a
NUCHAL TRANSLUCENCY (NT): 1.3 mm
NUMBER OF FETUSES: 1
PAPP-A VALUE: 890.1 ng/mL
Weight: 204 [lb_av]

## 2017-08-26 ENCOUNTER — Ambulatory Visit (INDEPENDENT_AMBULATORY_CARE_PROVIDER_SITE_OTHER): Payer: Self-pay | Admitting: Advanced Practice Midwife

## 2017-08-26 ENCOUNTER — Encounter: Payer: Self-pay | Admitting: Advanced Practice Midwife

## 2017-08-26 DIAGNOSIS — Z1379 Encounter for other screening for genetic and chromosomal anomalies: Secondary | ICD-10-CM

## 2017-08-26 DIAGNOSIS — Z331 Pregnant state, incidental: Secondary | ICD-10-CM

## 2017-08-26 DIAGNOSIS — Z3482 Encounter for supervision of other normal pregnancy, second trimester: Secondary | ICD-10-CM

## 2017-08-26 DIAGNOSIS — Z1389 Encounter for screening for other disorder: Secondary | ICD-10-CM

## 2017-08-26 DIAGNOSIS — Z363 Encounter for antenatal screening for malformations: Secondary | ICD-10-CM

## 2017-08-26 DIAGNOSIS — Z3A16 16 weeks gestation of pregnancy: Secondary | ICD-10-CM

## 2017-08-26 LAB — POCT URINALYSIS DIPSTICK
Glucose, UA: NEGATIVE
KETONES UA: NEGATIVE
Leukocytes, UA: NEGATIVE
Nitrite, UA: NEGATIVE
Protein, UA: NEGATIVE
RBC UA: NEGATIVE

## 2017-08-26 NOTE — Patient Instructions (Signed)

## 2017-08-26 NOTE — Progress Notes (Signed)
[id number] 9037w6d Estimated Date of Delivery: 02/04/18  Blood pressure 120/70, pulse 72, weight 205 lb 4.8 oz (93.1 kg), last menstrual period 04/30/2017.   BP weight and urine results all reviewed and noted.  Please refer to the obstetrical flow sheet for the fundal height and fetal heart rate documentation:  Patient denies any bleeding and no rupture of membranes symptoms or regular contractions. Patient is without complaints. All questions were answered.  Orders Placed This Encounter  Procedures  . INTEGRATED 2  . POCT urinalysis dipstick    Plan:  Continued routine obstetrical care, 2nd IT today  Return in about 2 weeks (around 09/09/2017) for LROB, AV:[id number]:Anatomy.

## 2017-09-03 LAB — INTEGRATED 2
AFP MARKER: 53.7 ng/mL
AFP MOM: 1.92
Crown Rump Length: 70.8 mm
DIA MoM: 1.36
DIA Value: 187.8 pg/mL
Estriol, Unconjugated: 1.16 ng/mL
GEST. AGE ON COLLECTION DATE: 13 wk
Gestational Age: 17 weeks
HCG VALUE: 43.4 [IU]/mL
MATERNAL AGE AT EDD: 24.4 a
NUMBER OF FETUSES: 1
Nuchal Translucency (NT): 1.3 mm
Nuchal Translucency MoM: 0.74
PAPP-A MOM: 1.15
PAPP-A VALUE: 890.1 ng/mL
Test Results:: NEGATIVE
WEIGHT: 204 [lb_av]
WEIGHT: 204 [lb_av]
hCG MoM: 1.92
uE3 MoM: 1.33

## 2017-09-07 ENCOUNTER — Other Ambulatory Visit (HOSPITAL_COMMUNITY): Payer: Self-pay | Admitting: Advanced Practice Midwife

## 2017-09-07 DIAGNOSIS — Z363 Encounter for antenatal screening for malformations: Secondary | ICD-10-CM

## 2017-09-08 ENCOUNTER — Ambulatory Visit (INDEPENDENT_AMBULATORY_CARE_PROVIDER_SITE_OTHER): Payer: Medicaid Other | Admitting: Advanced Practice Midwife

## 2017-09-08 ENCOUNTER — Encounter: Payer: Self-pay | Admitting: Advanced Practice Midwife

## 2017-09-08 ENCOUNTER — Ambulatory Visit (INDEPENDENT_AMBULATORY_CARE_PROVIDER_SITE_OTHER): Payer: Medicaid Other

## 2017-09-08 VITALS — BP 110/62 | HR 93 | Wt 207.0 lb

## 2017-09-08 DIAGNOSIS — Z363 Encounter for antenatal screening for malformations: Secondary | ICD-10-CM

## 2017-09-08 DIAGNOSIS — Z1389 Encounter for screening for other disorder: Secondary | ICD-10-CM

## 2017-09-08 DIAGNOSIS — Z331 Pregnant state, incidental: Secondary | ICD-10-CM

## 2017-09-08 DIAGNOSIS — Z3A18 18 weeks gestation of pregnancy: Secondary | ICD-10-CM

## 2017-09-08 DIAGNOSIS — Z3402 Encounter for supervision of normal first pregnancy, second trimester: Secondary | ICD-10-CM

## 2017-09-08 DIAGNOSIS — Z3482 Encounter for supervision of other normal pregnancy, second trimester: Secondary | ICD-10-CM

## 2017-09-08 LAB — POCT URINALYSIS DIPSTICK
Glucose, UA: NEGATIVE
Nitrite, UA: NEGATIVE
PROTEIN UA: NEGATIVE

## 2017-09-08 NOTE — Progress Notes (Signed)
LOW-RISK PREGNANCY VISIT Patient name: Jessica Ward MRN [id number]  Date of birth: Aug 28, 1991 Chief Complaint:   Routine Prenatal Visit (US today; feet swelling)  History of Present Illness:   Jessica Ward is a 26 y.o. [phone] female at 5882w5d with an Estimated Date of Delivery: 02/04/18 being seen today for ongoing management of a low-risk pregnancy.  Today she reports no complaints. Contractions: Not present. Vag. Bleeding: None.  Movement: Present. denies leaking of fluid. Review of Systems:   Pertinent items are noted in HPI Denies abnormal vaginal discharge w/ itching/odor/irritation, headaches, visual changes, shortness of breath, chest pain, abdominal pain, severe nausea/vomiting, or problems with urination or bowel movements unless otherwise stated above.  Pertinent History Reviewed:  Medical & Surgical Hx:   Past Medical History:  Diagnosis Date  . High cholesterol   . Miscarriage   . Normal delivery 10/27/2012   Past Surgical History:  Procedure Laterality Date  . NO PAST SURGERIES     Family History  Problem Relation Age of Onset  . Cancer Other        leukemia, breast-maternal great grandma  . Hypertension Mother   . Diabetes Maternal Grandfather   . Coronary artery disease Maternal Grandfather   . Heart attack Maternal Grandfather   . Stroke Maternal Grandfather   . Hyperlipidemia Father   . Heart disease Father        MI 12/2014  . Heart attack Father   . Other Daughter        tubes in ears  . Other Daughter        tubes in ears  . Hypercholesterolemia Paternal Aunt     Current Outpatient Medications:  .  acetaminophen (TYLENOL) 325 MG tablet, Take 650 mg by mouth every 6 (six) hours as needed for mild pain or headache. , Disp: , Rfl:  .  Doxylamine-Pyridoxine ER (BONJESTA) 20-20 MG TBCR, Take 1 tablet by mouth at bedtime. Can add 1 tablet in the morning if needed for nausea and vomiting, Disp: 60 tablet, Rfl: 8 .  flintstones complete (FLINTSTONES) 60 MG  chewable tablet, Chew 1 tablet by mouth daily. (Patient taking differently: Chew 2 tablets by mouth daily. ), Disp: , Rfl:  Social History: Reviewed -  reports that  has never smoked. she has never used smokeless tobacco.  Physical Assessment:   Vitals:   09/08/17 1048  BP: 110/62  Pulse: 93  Weight: 207 lb (93.9 kg)  Body mass index is 35.53 kg/m.        Physical Examination:   General appearance: Well appearing, and in no distress  Mental status: Alert, oriented to person, place, and time  Skin: Warm & dry  Cardiovascular: Normal heart rate noted  Respiratory: Normal respiratory effort, no distress  Abdomen: Soft, gravid, nontender  Pelvic: Cervical exam deferred         Extremities: Edema: Trace  Fetal Status:     Movement: Present    Results for orders placed or performed in visit on 09/08/17 (from the past 24 hour(s))  POCT urinalysis dipstick   Collection Time: 09/08/17 10:49 AM  Result Value Ref Range   Color, UA     Clarity, UA     Glucose, UA neg    Bilirubin, UA     Ketones, UA trace    Spec Grav, UA  1.010 - 1.025   Blood, UA trace    pH, UA  5.0 - 8.0   Protein, UA neg    Urobilinogen, UA  0.2 or 1.0 E.U./dL   Nitrite, UA neg    Leukocytes, UA Trace (A) Negative    Assessment & Plan:  1) Low-risk pregnancy [id number] at 7395w5d with an Estimated Date of Delivery: 02/04/18   2) ,    Labs/procedures/US today: US 18+5 wks,cephalic,ant pl gr 0,cx 4.1 cm,normal ovaries bilat,svp of fluid 5.1 cm,fhr 169 bpm,efw 292 g,anatomy complete,no obvious abnormalities     Plan:  Continue routine obstetrical care    Follow-up: Return in about 4 weeks (around 10/06/2017) for LROB.  Orders Placed This Encounter  Procedures  . POCT urinalysis dipstick   CRESENZO-DISHMAN,Blondine Hottel CNM 09/08/2017 11:08 AM

## 2017-09-08 NOTE — Progress Notes (Signed)
US 18+5 wks,cephalic,ant pl gr 0,cx 4.1 cm,normal ovaries bilat,svp of fluid 5.1 cm,fhr 169 bpm,efw 292 g,anatomy complete,no obvious abnormalities

## 2017-09-23 ENCOUNTER — Other Ambulatory Visit (HOSPITAL_COMMUNITY): Payer: Self-pay | Admitting: Advanced Practice Midwife

## 2017-09-23 ENCOUNTER — Telehealth: Payer: Self-pay | Admitting: Advanced Practice Midwife

## 2017-09-23 MED ORDER — DOXYLAMINE-PYRIDOXINE 10-10 MG PO TBEC: DELAYED_RELEASE_TABLET | ORAL | 3 refills | Status: DC

## 2017-09-23 NOTE — Telephone Encounter (Signed)
Informed patient that diclegis is [facility] preferred nausea medication but it was switched to family planning. Advised patient to call case worker and have them switch it back over. Pt stated she would.

## 2017-09-23 NOTE — Telephone Encounter (Signed)
Patient called stating that she was prescribed a medication for her nausea but they are needing a prior auth, pt would like to know if she could get samples of the medication while we are working on the other. Please contact pt

## 2017-10-06 ENCOUNTER — Telehealth: Payer: Self-pay | Admitting: *Deleted

## 2017-10-06 ENCOUNTER — Ambulatory Visit (INDEPENDENT_AMBULATORY_CARE_PROVIDER_SITE_OTHER): Payer: Medicaid Other | Admitting: Advanced Practice Midwife

## 2017-10-06 VITALS — BP 122/60 | Wt 206.0 lb

## 2017-10-06 DIAGNOSIS — Z1389 Encounter for screening for other disorder: Secondary | ICD-10-CM

## 2017-10-06 DIAGNOSIS — Z331 Pregnant state, incidental: Secondary | ICD-10-CM

## 2017-10-06 DIAGNOSIS — Z3482 Encounter for supervision of other normal pregnancy, second trimester: Secondary | ICD-10-CM

## 2017-10-06 DIAGNOSIS — Z3A22 22 weeks gestation of pregnancy: Secondary | ICD-10-CM

## 2017-10-06 LAB — POCT URINALYSIS DIPSTICK
Blood, UA: NEGATIVE
Glucose, UA: NEGATIVE
Ketones, UA: NEGATIVE
LEUKOCYTES UA: NEGATIVE
NITRITE UA: NEGATIVE
PROTEIN UA: NEGATIVE

## 2017-10-06 MED ORDER — ONDANSETRON 4 MG PO TBDP: 4.0000 mg | ORAL_TABLET | Freq: Four times a day (QID) | ORAL | 2 refills | Status: DC | PRN

## 2017-10-06 NOTE — Patient Instructions (Signed)

## 2017-10-06 NOTE — Progress Notes (Signed)
[id number] 6547w5d Estimated Date of Delivery: 02/04/18  Blood pressure 122/60, weight 206 lb (93.4 kg), last menstrual period 04/30/2017.   BP weight and urine results all reviewed and noted.  Please refer to the obstetrical flow sheet for the fundal height and fetal heart rate documentation:  Patient reports good fetal movement, denies any bleeding and no rupture of membranes symptoms or regular contractions. Patient is without complaints. Still can't get diclegis (insurance issue, no samples) so doesn't eat much. Rx zofran.   All questions were answered.  Orders Placed This Encounter  Procedures  . POCT Urinalysis Dipstick    Plan:  Continued routine obstetrical care,   Return in about 4 weeks (around 11/03/2017) for PN2/LROB.

## 2017-10-06 NOTE — Telephone Encounter (Signed)
Informed pharmacy PA was done and approved for Diclegis. Approval number [id number].

## 2017-10-20 NOTE — L&D Delivery Note (Signed)
Patient is 27 y.o. [id number] 7568w2d admitted for SROM. S/p augmentation with Pitocin.  Prenatal course also uncomplicated.  This was an unwitnessed delivery by RN. Upon entering room baby was delivered and cord was clamped and cut. I delivered the placenta.   Delivery Note At 8:11 PM a viable female was delivered via Vaginal, Spontaneous.  APGAR: 9, 9; weight pending.   Placenta status: Intact.  Cord: 3V with the following complications: None.  Cord pH: N/A  Anesthesia: Epidural  Episiotomy: None Lacerations: Vaginal  Suture Repair: 3.0 Monocryl Est. Blood Loss (mL):  50  Mom to postpartum.  Baby to Couplet care / Skin to Skin.  Caryl AdaJazma Tryston Gilliam, DO 01/30/2018, 9:04 PM

## 2017-11-03 ENCOUNTER — Ambulatory Visit (INDEPENDENT_AMBULATORY_CARE_PROVIDER_SITE_OTHER): Payer: Medicaid Other | Admitting: Obstetrics & Gynecology

## 2017-11-03 ENCOUNTER — Other Ambulatory Visit: Payer: Medicaid Other

## 2017-11-03 ENCOUNTER — Other Ambulatory Visit: Payer: Self-pay

## 2017-11-03 ENCOUNTER — Encounter: Payer: Self-pay | Admitting: Obstetrics & Gynecology

## 2017-11-03 VITALS — BP 118/64 | HR 104 | Wt 208.0 lb

## 2017-11-03 DIAGNOSIS — Z3A26 26 weeks gestation of pregnancy: Secondary | ICD-10-CM

## 2017-11-03 DIAGNOSIS — Z1389 Encounter for screening for other disorder: Secondary | ICD-10-CM

## 2017-11-03 DIAGNOSIS — Z331 Pregnant state, incidental: Secondary | ICD-10-CM

## 2017-11-03 DIAGNOSIS — Z3482 Encounter for supervision of other normal pregnancy, second trimester: Secondary | ICD-10-CM

## 2017-11-03 DIAGNOSIS — Z131 Encounter for screening for diabetes mellitus: Secondary | ICD-10-CM

## 2017-11-03 LAB — POCT URINALYSIS DIPSTICK
Blood, UA: NEGATIVE
GLUCOSE UA: NEGATIVE
Ketones, UA: NEGATIVE
Leukocytes, UA: NEGATIVE
Nitrite, UA: NEGATIVE
Protein, UA: NEGATIVE

## 2017-11-03 MED ORDER — OMEPRAZOLE 20 MG PO CPDR: 20.0000 mg | DELAYED_RELEASE_CAPSULE | Freq: Every day | ORAL | 6 refills | Status: DC

## 2017-11-03 NOTE — Progress Notes (Signed)
[id number] 3172w5d Estimated Date of Delivery: 02/04/18  Blood pressure 118/64, pulse (!) 104, weight 208 lb (94.3 kg), last menstrual period 04/30/2017.   BP weight and urine results all reviewed and noted.  Please refer to the obstetrical flow sheet for the fundal height and fetal heart rate documentation:  Patient reports good fetal movement, denies any bleeding and no rupture of membranes symptoms or regular contractions. Patient is without complaints. All questions were answered.  Orders Placed This Encounter  Procedures  . POCT urinalysis dipstick    Plan:  Continued routine obstetrical care,  Will try omeprazole to see if it helps her nausea and her ticklish cough  To sign BTL papers today   Meds ordered this encounter  Medications  . omeprazole (PRILOSEC) 20 MG capsule    Sig: Take 1 capsule (20 mg total) by mouth daily. 1 tablet a day    Dispense:  30 capsule    Refill:  6     Return in about 3 weeks (around 11/24/2017) for LROB.

## 2017-11-04 LAB — ANTIBODY SCREEN: ANTIBODY SCREEN: NEGATIVE

## 2017-11-04 LAB — CBC
Hematocrit: 33.9 % — ABNORMAL LOW (ref 34.0–46.6)
Hemoglobin: 11.5 g/dL (ref 11.1–15.9)
MCH: 30.4 pg (ref 26.6–33.0)
MCHC: 33.9 g/dL (ref 31.5–35.7)
MCV: 90 fL (ref 79–97)
Platelets: 200 10*3/uL (ref 150–379)
RBC: 3.78 x10E6/uL (ref 3.77–5.28)
RDW: 13.9 % (ref 12.3–15.4)
WBC: 6.8 10*3/uL (ref 3.4–10.8)

## 2017-11-04 LAB — GLUCOSE TOLERANCE, 2 HOURS W/ 1HR
GLUCOSE, 1 HOUR: 63 mg/dL — AB (ref 65–179)
GLUCOSE, 2 HOUR: 65 mg/dL (ref 65–152)
GLUCOSE, FASTING: 74 mg/dL (ref 65–91)

## 2017-11-04 LAB — HIV ANTIBODY (ROUTINE TESTING W REFLEX): HIV SCREEN 4TH GENERATION: NONREACTIVE

## 2017-11-04 LAB — RPR: RPR Ser Ql: NONREACTIVE

## 2017-11-24 ENCOUNTER — Encounter: Payer: Self-pay | Admitting: Women's Health

## 2017-11-24 ENCOUNTER — Ambulatory Visit (INDEPENDENT_AMBULATORY_CARE_PROVIDER_SITE_OTHER): Payer: Medicaid Other | Admitting: Women's Health

## 2017-11-24 VITALS — BP 100/60 | HR 98 | Wt 209.2 lb

## 2017-11-24 DIAGNOSIS — O26893 Other specified pregnancy related conditions, third trimester: Secondary | ICD-10-CM | POA: Diagnosis not present

## 2017-11-24 DIAGNOSIS — Z331 Pregnant state, incidental: Secondary | ICD-10-CM

## 2017-11-24 DIAGNOSIS — Z3A29 29 weeks gestation of pregnancy: Secondary | ICD-10-CM

## 2017-11-24 DIAGNOSIS — Z1389 Encounter for screening for other disorder: Secondary | ICD-10-CM

## 2017-11-24 DIAGNOSIS — Z3483 Encounter for supervision of other normal pregnancy, third trimester: Secondary | ICD-10-CM

## 2017-11-24 DIAGNOSIS — N898 Other specified noninflammatory disorders of vagina: Secondary | ICD-10-CM | POA: Diagnosis not present

## 2017-11-24 DIAGNOSIS — Z3482 Encounter for supervision of other normal pregnancy, second trimester: Secondary | ICD-10-CM

## 2017-11-24 LAB — POCT URINALYSIS DIPSTICK
Glucose, UA: NEGATIVE
Ketones, UA: NEGATIVE
LEUKOCYTES UA: NEGATIVE
NITRITE UA: NEGATIVE
RBC UA: NEGATIVE

## 2017-11-24 LAB — POCT WET PREP (WET MOUNT)
Clue Cells Wet Prep Whiff POC: NEGATIVE
TRICHOMONAS WET PREP HPF POC: ABSENT

## 2017-11-24 NOTE — Progress Notes (Signed)
LOW-RISK PREGNANCY VISIT Patient name: Jessica Ward Dietz MRN [id number]  Date of birth: 1991/03/09 Chief Complaint:   Routine Prenatal Visit  History of Present Illness:   Jessica Ward Gaultney is a 27 y.o. [id number] female at [redacted]w[redacted]d with an Estimated Date of Delivery: 02/04/18 being seen today for ongoing management of a low-risk pregnancy.  Today she reports having to change panties multiple times throughout the day for past 2wks. Denies abnormal discharge, itching/odor/irritation. Doesn't think it's her water that has broken.  Contractions: Not present.  .  Movement: Present. reports leaking of fluid. Review of Systems:   Pertinent items are noted in HPI Denies abnormal vaginal discharge w/ itching/odor/irritation, headaches, visual changes, shortness of breath, chest pain, abdominal pain, severe nausea/vomiting, or problems with urination or bowel movements unless otherwise stated above. Pertinent History Reviewed:  Reviewed past medical,surgical, social, obstetrical and family history.  Reviewed problem list, medications and allergies. Physical Assessment:   Vitals:   11/24/17 1005  BP: 100/60  Pulse: 98  Weight: 209 lb 3.2 oz (94.9 kg)  Body mass index is 35.91 kg/m.        Physical Examination:   General appearance: Well appearing, and in no distress  Mental status: Alert, oriented to person, place, and time  Skin: Warm & dry  Cardiovascular: Normal heart rate noted  Respiratory: Normal respiratory effort, no distress  Abdomen: Soft, gravid, nontender  Pelvic: SSE: cx visually closed, no pooling, no change w/ valsalva, fern & nitrazine neg         Extremities: Edema: None  Fetal Status: Fetal Heart Rate (bpm): 145 Fundal Height: 31 cm Movement: Present    Results for orders placed or performed in visit on 11/24/17 (from the past 24 hour(s))  POCT urinalysis dipstick   Collection Time: 11/24/17 10:11 AM  Result Value Ref Range   Color, UA     Clarity, UA     Glucose, UA neg    Bilirubin, UA     Ketones, UA neg    Spec Grav, UA  1.010 - 1.025   Blood, UA neg    pH, UA  5.0 - 8.0   Protein, UA trace    Urobilinogen, UA  0.2 or 1.0 E.U./dL   Nitrite, UA neg    Leukocytes, UA Negative Negative   Appearance     Odor    POCT Wet Prep Mellody Drown([place]et [place])   Collection Time: 11/24/17 10:31 AM  Result Value Ref Range   Source Wet Prep POC vagina    WBC, Wet Prep HPF POC few    Bacteria Wet Prep HPF POC Few Few   BACTERIA WET PREP MORPHOLOGY POC     Clue Cells Wet Prep HPF POC None None   Clue Cells Wet Prep Whiff POC Negative Whiff    Yeast Wet Prep HPF POC None    KOH Wet Prep POC     Trichomonas Wet Prep HPF POC Absent Absent    Assessment & Plan:  1) Low-risk pregnancy [id number] at [redacted]w[redacted]d with an Estimated Date of Delivery: 02/04/18   2) Increased vaginal d/c, no evidence of ROM or vag infection   Meds: No orders of the defined types were placed in this encounter.  Labs/procedures today: SSE, wet prep  Plan:  Continue routine obstetrical care   Reviewed: Preterm labor symptoms and general obstetric precautions including but not limited to vaginal bleeding, contractions, leaking of fluid and fetal movement were reviewed in detail with the patient. Recommended Tdap at HD/PCP per [facility]  guidelines.  All questions were answered  Follow-up: Return in about 2 weeks (around 12/08/2017) for LROB.  Orders Placed This Encounter  Procedures  . POCT urinalysis dipstick  . POCT [employer] ([address] [place])   Marge Duncans CNM, [facility] 11/24/2017 10:31 AM

## 2017-11-24 NOTE — Patient Instructions (Signed)
Jessica Ward, I greatly value your feedback.  If you receive a survey following your visit with us today, we appreciate you taking the time to fill it out.  Thanks, Jessica Ward, CNM, WHNP-BC   Call the office [phone]) or go to [facility] if:  You begin to have strong, frequent contractions  Your water breaks.  Sometimes it is a big gush of fluid, sometimes it is just a trickle that keeps getting your panties wet or running down your legs  You have vaginal bleeding.  It is normal to have a small amount of spotting if your cervix was checked.   You don't feel your baby moving like normal.  If you don't, get you something to eat and drink and lay down and focus on feeling your baby move.  You should feel at least 10 movements in 2 hours.  If you don't, you should call the office or go to [facility].    Tdap Vaccine  It is recommended that you get the Tdap vaccine during the third trimester of EACH pregnancy to help protect your baby from getting pertussis (whooping cough)  27-36 weeks is the BEST time to do this so that you can pass the protection on to your baby. During pregnancy is better than after pregnancy, but if you are unable to get it during pregnancy it will be offered at the hospital.   You can get this vaccine at the health department or your family doctor  Everyone who will be around your baby should also be up-to-date on their vaccines. Adults (who are not pregnant) only need 1 dose of Tdap during adulthood.   Third Trimester of Pregnancy The third trimester is from week 29 through week 42, months 7 through 9. The third trimester is a time when the fetus is growing rapidly. At the end of the ninth month, the fetus is about 20 inches in length and weighs 6-10 pounds.  BODY CHANGES Your body goes through many changes during pregnancy. The changes vary from woman to woman.   Your weight will continue to increase. You can expect to gain 25-35 pounds (11-16 kg) by the  end of the pregnancy.  You may begin to get stretch marks on your hips, abdomen, and breasts.  You may urinate more often because the fetus is moving lower into your pelvis and pressing on your bladder.  You may develop or continue to have heartburn as a result of your pregnancy.  You may develop constipation because certain hormones are causing the muscles that push waste through your intestines to slow down.  You may develop hemorrhoids or swollen, bulging veins (varicose veins).  You may have pelvic pain because of the weight gain and pregnancy hormones relaxing your joints between the bones in your pelvis. Backaches may result from overexertion of the muscles supporting your posture.  You may have changes in your hair. These can include thickening of your hair, rapid growth, and changes in texture. Some women also have hair loss during or after pregnancy, or hair that feels dry or thin. Your hair will most likely return to normal after your baby is born.  Your breasts will continue to grow and be tender. A yellow discharge may leak from your breasts called colostrum.  Your belly button may stick out.  You may feel short of breath because of your expanding uterus.  You may notice the fetus "dropping," or moving lower in your abdomen.  You may have a bloody mucus  discharge. This usually occurs a few days to a week before labor begins.  Your cervix becomes thin and soft (effaced) near your due date. WHAT TO EXPECT AT YOUR PRENATAL EXAMS  You will have prenatal exams every 2 weeks until week 36. Then, you will have weekly prenatal exams. During a routine prenatal visit:  You will be weighed to make sure you and the fetus are growing normally.  Your blood pressure is taken.  Your abdomen will be measured to track your baby's growth.  The fetal heartbeat will be listened to.  Any test results from the previous visit will be discussed.  You may have a cervical check near your due  date to see if you have effaced. At around 36 weeks, your caregiver will check your cervix. At the same time, your caregiver will also perform a test on the secretions of the vaginal tissue. This test is to determine if a type of bacteria, Group B streptococcus, is present. Your caregiver will explain this further. Your caregiver may ask you:  What your birth plan is.  How you are feeling.  If you are feeling the baby move.  If you have had any abnormal symptoms, such as leaking fluid, bleeding, severe headaches, or abdominal cramping.  If you have any questions. Other tests or screenings that may be performed during your third trimester include:  Blood tests that check for low iron levels (anemia).  Fetal testing to check the health, activity level, and growth of the fetus. Testing is done if you have certain medical conditions or if there are problems during the pregnancy. FALSE LABOR You may feel small, irregular contractions that eventually go away. These are called Braxton Hicks contractions, or false labor. Contractions may last for hours, days, or even weeks before true labor sets in. If contractions come at regular intervals, intensify, or become painful, it is best to be seen by your caregiver.  SIGNS OF LABOR   Menstrual-like cramps.  Contractions that are 5 minutes apart or less.  Contractions that start on the top of the uterus and spread down to the lower abdomen and back.  A sense of increased pelvic pressure or back pain.  A watery or bloody mucus discharge that comes from the vagina. If you have any of these signs before the 37th week of pregnancy, call your caregiver right away. You need to go to the hospital to get checked immediately. HOME CARE INSTRUCTIONS   Avoid all smoking, herbs, alcohol, and unprescribed drugs. These chemicals affect the formation and growth of the baby.  Follow your caregiver's instructions regarding medicine use. There are medicines that  are either safe or unsafe to take during pregnancy.  Exercise only as directed by your caregiver. Experiencing uterine cramps is a good sign to stop exercising.  Continue to eat regular, healthy meals.  Wear a good support bra for breast tenderness.  Do not use hot tubs, steam rooms, or saunas.  Wear your seat belt at all times when driving.  Avoid raw meat, uncooked cheese, cat litter boxes, and soil used by cats. These carry germs that can cause birth defects in the baby.  Take your prenatal vitamins.  Try taking a stool softener (if your caregiver approves) if you develop constipation. Eat more high-fiber foods, such as fresh vegetables or fruit and whole grains. Drink plenty of fluids to keep your urine clear or pale yellow.  Take warm sitz baths to soothe any pain or discomfort caused by hemorrhoids. Use  hemorrhoid cream if your caregiver approves.  If you develop varicose veins, wear support hose. Elevate your feet for 15 minutes, 3-4 times a day. Limit salt in your diet.  Avoid heavy lifting, wear low heal shoes, and practice good posture.  Rest a lot with your legs elevated if you have leg cramps or low back pain.  Visit your dentist if you have not gone during your pregnancy. Use a soft toothbrush to brush your teeth and be gentle when you floss.  A sexual relationship may be continued unless your caregiver directs you otherwise.  Do not travel far distances unless it is absolutely necessary and only with the approval of your caregiver.  Take prenatal classes to understand, practice, and ask questions about the labor and delivery.  Make a trial run to the hospital.  Pack your hospital bag.  Prepare the baby's nursery.  Continue to go to all your prenatal visits as directed by your caregiver. SEEK MEDICAL CARE IF:  You are unsure if you are in labor or if your water has broken.  You have dizziness.  You have mild pelvic cramps, pelvic pressure, or nagging pain  in your abdominal area.  You have persistent nausea, vomiting, or diarrhea.  You have a bad smelling vaginal discharge.  You have pain with urination. SEEK IMMEDIATE MEDICAL CARE IF:   You have a fever.  You are leaking fluid from your vagina.  You have spotting or bleeding from your vagina.  You have severe abdominal cramping or pain.  You have rapid weight loss or gain.  You have shortness of breath with chest pain.  You notice sudden or extreme swelling of your face, hands, ankles, feet, or legs.  You have not felt your baby move in over an hour.  You have severe headaches that do not go away with medicine.  You have vision changes. Document Released: 09/30/2001 Document Revised: 10/11/2013 Document Reviewed: 12/07/2012 [facility] Patient Information 2015 [place], [place]. This information is not intended to replace advice given to you by your health care provider. Make sure you discuss any questions you have with your health care provider.

## 2017-11-30 ENCOUNTER — Inpatient Hospital Stay (HOSPITAL_COMMUNITY)
Admission: AD | Admit: 2017-11-30 | Discharge: 2017-11-30 | Disposition: A | Discharge status: Home / Self Care | Payer: Medicaid Other | Source: Ambulatory Visit | Attending: Obstetrics & Gynecology | Admitting: Obstetrics & Gynecology

## 2017-11-30 ENCOUNTER — Encounter (HOSPITAL_COMMUNITY): Payer: Self-pay | Admitting: *Deleted

## 2017-11-30 DIAGNOSIS — R05 Cough: Secondary | ICD-10-CM | POA: Diagnosis present

## 2017-11-30 DIAGNOSIS — Z3A30 30 weeks gestation of pregnancy: Secondary | ICD-10-CM | POA: Insufficient documentation

## 2017-11-30 DIAGNOSIS — O9989 Other specified diseases and conditions complicating pregnancy, childbirth and the puerperium: Secondary | ICD-10-CM | POA: Diagnosis not present

## 2017-11-30 DIAGNOSIS — J111 Influenza due to unidentified influenza virus with other respiratory manifestations: Secondary | ICD-10-CM | POA: Diagnosis not present

## 2017-11-30 DIAGNOSIS — O26893 Other specified pregnancy related conditions, third trimester: Secondary | ICD-10-CM | POA: Insufficient documentation

## 2017-11-30 DIAGNOSIS — J029 Acute pharyngitis, unspecified: Secondary | ICD-10-CM | POA: Diagnosis present

## 2017-11-30 DIAGNOSIS — Z79899 Other long term (current) drug therapy: Secondary | ICD-10-CM | POA: Insufficient documentation

## 2017-11-30 LAB — CBC WITH DIFFERENTIAL/PLATELET
BASOS ABS: 0 10*3/uL (ref 0.0–0.1)
BASOS PCT: 0 %
EOS ABS: 0 10*3/uL (ref 0.0–0.7)
Eosinophils Relative: 0 %
HCT: 31 % — ABNORMAL LOW (ref 36.0–46.0)
Hemoglobin: 10.7 g/dL — ABNORMAL LOW (ref 12.0–15.0)
LYMPHS ABS: 0.7 10*3/uL (ref 0.7–4.0)
Lymphocytes Relative: 12 %
MCH: 30.1 pg (ref 26.0–34.0)
MCHC: 34.5 g/dL (ref 30.0–36.0)
MCV: 87.3 fL (ref 78.0–100.0)
Monocytes Absolute: 0.3 10*3/uL (ref 0.1–1.0)
Monocytes Relative: 5 %
NEUTROS PCT: 83 %
Neutro Abs: 4.9 10*3/uL (ref 1.7–7.7)
PLATELETS: 169 10*3/uL (ref 150–400)
RBC: 3.55 MIL/uL — AB (ref 3.87–5.11)
RDW: 13.3 % (ref 11.5–15.5)
WBC: 5.8 10*3/uL (ref 4.0–10.5)

## 2017-11-30 LAB — INFLUENZA PANEL BY PCR (TYPE A & B)
INFLBPCR: NEGATIVE
Influenza A By PCR: POSITIVE — AB

## 2017-11-30 MED ORDER — ACETAMINOPHEN 500 MG PO TABS: 1000.0000 mg | ORAL_TABLET | Freq: Once | ORAL | Status: DC

## 2017-11-30 MED ORDER — OSELTAMIVIR PHOSPHATE 75 MG PO CAPS: 75.0000 mg | ORAL_CAPSULE | Freq: Two times a day (BID) | ORAL | 0 refills | Status: DC

## 2017-11-30 MED ORDER — ACETAMINOPHEN 500 MG PO TABS
1000.0000 mg | ORAL_TABLET | Freq: Once | ORAL | Status: AC
  Administered 2017-11-30: 1000 mg via ORAL
  Filled 2017-11-30: qty 2

## 2017-11-30 MED ORDER — LACTATED RINGERS IV BOLUS (SEPSIS)
1000.0000 mL | Freq: Once | INTRAVENOUS | Status: AC
  Administered 2017-11-30: 1000 mL via INTRAVENOUS

## 2017-11-30 NOTE — MAU Provider Note (Signed)
History     CSN: [id number]  Arrival date and time: 11/30/17 8657   First Provider Initiated Contact with Patient 11/30/17 1903      Chief Complaint  Patient presents with  . Cough  . Sore Throat   HPI  Jessica Ward is a 27 y.o. Q4O9629 at [redacted]w[redacted]d who presents with URI symptoms. Symptoms began on Saturday. Her husband was diagnosed with the flu over the weekend. She did not receive the flu vaccine & is not on tamiflu for postexposure prophylaxis.  Symptoms include body aches, left ear pain, sore throat, nonproductive cough, and fatigue. Denies n/v/d, abdominal pain, vaginal bleeding, or LOF. Positive fetal movement. Took tylenol yesterday without relief.   OB History    Gravida Para Term Preterm AB Living   5 3 3   1 3    SAB TAB Ectopic Multiple Live Births   1     0 3      Past Medical History:  Diagnosis Date  . High cholesterol   . Miscarriage   . Normal delivery 10/27/2012    Past Surgical History:  Procedure Laterality Date  . NO PAST SURGERIES      Family History  Problem Relation Age of Onset  . Cancer Other        leukemia, breast-maternal great grandma  . Hypertension Mother   . Diabetes Maternal Grandfather   . Coronary artery disease Maternal Grandfather   . Heart attack Maternal Grandfather   . Stroke Maternal Grandfather   . Hyperlipidemia Father   . Heart disease Father        MI 12/2014  . Heart attack Father   . Other Daughter        tubes in ears  . Other Daughter        tubes in ears  . Hypercholesterolemia Paternal Aunt     Social History   Tobacco Use  . Smoking status: Never Smoker  . Smokeless tobacco: Never Used  Substance Use Topics  . Alcohol use: No  . Drug use: No    Allergies: No Known Allergies  Medications Prior to Admission  Medication Sig Dispense Refill Last Dose  . acetaminophen (TYLENOL) 325 MG tablet Take 650 mg by mouth every 6 (six) hours as needed for mild pain or headache.    Not Taking  .  Doxylamine-Pyridoxine 10-10 MG TBEC 2 PO qhs; may take 1po in am and 1po in afternoon prn nausea (Patient not taking: Reported on 11/24/2017) 120 tablet 3 Not Taking  . flintstones complete (FLINTSTONES) 60 MG chewable tablet Chew 1 tablet by mouth daily. (Patient taking differently: Chew 2 tablets by mouth daily. )   Taking  . omeprazole (PRILOSEC) 20 MG capsule Take 1 capsule (20 mg total) by mouth daily. 1 tablet a day 30 capsule 6 Taking  . ondansetron (ZOFRAN ODT) 4 MG disintegrating tablet Take 1 tablet (4 mg total) by mouth every 6 (six) hours as needed for nausea. 30 tablet 2 Taking    Review of Systems  Constitutional: Positive for chills and fatigue. Negative for fever.  HENT: Positive for ear pain and sore throat.   Respiratory: Positive for cough.   Gastrointestinal: Negative.   Genitourinary: Negative.   Musculoskeletal: Positive for myalgias.   Physical Exam   Blood pressure (!) 112/55, pulse (!) 102, temperature 98.2 F (36.8 C), temperature source Oral, resp. rate 18, last menstrual period 04/30/2017, SpO2 100 %.  Physical Exam  Nursing note and vitals reviewed. Constitutional: She is  oriented to person, place, and time. She appears well-developed and well-nourished. No distress.  HENT:  Head: Normocephalic and atraumatic.  Right Ear: Tympanic membrane normal.  Left Ear: Tympanic membrane normal.  Mouth/Throat: No oropharyngeal exudate.  Eyes: Conjunctivae are normal. Right eye exhibits no discharge. Left eye exhibits no discharge. No scleral icterus.  Neck: Normal range of motion.  Cardiovascular: Normal rate, regular rhythm and normal heart sounds.  No murmur heard. Respiratory: Effort normal and breath sounds normal. No respiratory distress. She has no wheezes.  Neurological: She is alert and oriented to person, place, and time.  Skin: Skin is warm and dry. She is not diaphoretic.  Psychiatric: She has a normal mood and affect. Her behavior is normal. Judgment and  thought content normal.    MAU Course  Procedures Results for orders placed or performed during the hospital encounter of 11/30/17 (from the past 24 hour(s))  Influenza panel by PCR (type A & B)     Status: Abnormal   Collection Time: 11/30/17  7:41 PM  Result Value Ref Range   Influenza A By PCR POSITIVE (A) NEGATIVE   Influenza B By PCR NEGATIVE NEGATIVE  CBC with Differential/Platelet     Status: Abnormal   Collection Time: 11/30/17  7:41 PM  Result Value Ref Range   WBC 5.8 4.0 - 10.5 K/uL   RBC 3.55 (L) 3.87 - 5.11 MIL/uL   Hemoglobin 10.7 (L) 12.0 - 15.0 g/dL   HCT 09.831.0 (L) 11.936.0 - 14.746.0 %   MCV 87.3 78.0 - 100.0 fL   MCH 30.1 26.0 - 34.0 pg   MCHC 34.5 30.0 - 36.0 g/dL   RDW 82.913.3 56.211.5 - 13.015.5 %   Platelets 169 150 - 400 K/uL   Neutrophils Relative % 83 %   Neutro Abs 4.9 1.7 - 7.7 K/uL   Lymphocytes Relative 12 %   Lymphs Abs 0.7 0.7 - 4.0 K/uL   Monocytes Relative 5 %   Monocytes Absolute 0.3 0.1 - 1.0 K/uL   Eosinophils Relative 0 %   Eosinophils Absolute 0.0 0.0 - 0.7 K/uL   Basophils Relative 0 %   Basophils Absolute 0.0 0.0 - 0.1 K/uL    MDM NST:  Baseline: 155 bpm, Variability: Good {> 6 bpm), Accelerations: Reactive and Decelerations: Absent Maternal & fetal tachycardia when patient first placed on monitor. IV fluid bolus, CBC, & flu swab ordered.   Care [facility] CN[facility]  Judeth Hornrin Lawrence, FNP  11/30/2017 8:10 PM  Assessment and Plan  Flu - Rx Tamiflu 75 mg BID x 7 days sent - List of safe medications in pregnancy given - Advised to get plenty of rest and drink lots of fluids daily - Advised to limit further exposure to any other people - Discharge home - Keep scheduled appt on 12/08/2017 - Patient verbalized an understanding of the plan of care and agrees.  Raelyn MoraRolitta Kallan Bischoff, CNM 11/30/2017 9:24 PM

## 2017-11-30 NOTE — MAU Note (Signed)
Pt C/O L ear pain, sore throat, cough, abdomen hurts from coughing, feels weak.  Unsure if she has had a fever.    Her husband tested positive for flu on Saturday.  Both kids have had colds. Denies contractions, bleeding or LOF.

## 2017-11-30 NOTE — MAU Note (Signed)
Urine in the lab  

## 2017-11-30 NOTE — Discharge Instructions (Signed)

## 2017-12-08 ENCOUNTER — Other Ambulatory Visit: Payer: Self-pay

## 2017-12-08 ENCOUNTER — Ambulatory Visit (INDEPENDENT_AMBULATORY_CARE_PROVIDER_SITE_OTHER): Payer: Medicaid Other | Admitting: Obstetrics & Gynecology

## 2017-12-08 ENCOUNTER — Encounter: Payer: Self-pay | Admitting: Obstetrics & Gynecology

## 2017-12-08 VITALS — BP 100/54 | HR 105 | Wt 209.0 lb

## 2017-12-08 DIAGNOSIS — Z331 Pregnant state, incidental: Secondary | ICD-10-CM

## 2017-12-08 DIAGNOSIS — Z1389 Encounter for screening for other disorder: Secondary | ICD-10-CM

## 2017-12-08 DIAGNOSIS — Z3A31 31 weeks gestation of pregnancy: Secondary | ICD-10-CM

## 2017-12-08 DIAGNOSIS — Z3483 Encounter for supervision of other normal pregnancy, third trimester: Secondary | ICD-10-CM

## 2017-12-08 LAB — POCT URINALYSIS DIPSTICK
GLUCOSE UA: NEGATIVE
KETONES UA: NEGATIVE
Leukocytes, UA: NEGATIVE
Nitrite, UA: NEGATIVE
Protein, UA: NEGATIVE
RBC UA: NEGATIVE

## 2017-12-08 NOTE — Progress Notes (Signed)
[id number] 8450w5d Estimated Date of Delivery: 02/04/18  Blood pressure (!) 100/54, pulse (!) 105, weight 209 lb (94.8 kg), last menstrual period 04/30/2017.   BP weight and urine results all reviewed and noted.  Please refer to the obstetrical flow sheet for the fundal height and fetal heart rate documentation:  Patient reports good fetal movement, denies any bleeding and no rupture of membranes symptoms or regular contractions. Patient is without complaints. All questions were answered.  Orders Placed This Encounter  Procedures  . POCT urinalysis dipstick    Plan:  Continued routine obstetrical care, oropharynx is normal Not consistent with strep, s/p +flu  Return in about 2 weeks (around 12/22/2017) for LROB.

## 2017-12-22 ENCOUNTER — Ambulatory Visit (INDEPENDENT_AMBULATORY_CARE_PROVIDER_SITE_OTHER): Payer: Medicaid Other | Admitting: Women's Health

## 2017-12-22 ENCOUNTER — Encounter: Payer: Self-pay | Admitting: Women's Health

## 2017-12-22 ENCOUNTER — Other Ambulatory Visit: Payer: Self-pay

## 2017-12-22 VITALS — BP 110/62 | HR 96 | Wt 208.0 lb

## 2017-12-22 DIAGNOSIS — Z1389 Encounter for screening for other disorder: Secondary | ICD-10-CM

## 2017-12-22 DIAGNOSIS — Z331 Pregnant state, incidental: Secondary | ICD-10-CM

## 2017-12-22 DIAGNOSIS — Z3A33 33 weeks gestation of pregnancy: Secondary | ICD-10-CM

## 2017-12-22 DIAGNOSIS — Z3483 Encounter for supervision of other normal pregnancy, third trimester: Secondary | ICD-10-CM

## 2017-12-22 LAB — POCT URINALYSIS DIPSTICK
Glucose, UA: NEGATIVE
Ketones, UA: NEGATIVE
LEUKOCYTES UA: NEGATIVE
Nitrite, UA: NEGATIVE
Protein, UA: NEGATIVE

## 2017-12-22 NOTE — Progress Notes (Signed)
   LOW-RISK PREGNANCY VISIT Patient name: Jessica Ward Sheppard MRN [id number]  Date of birth: February 15, 1991 Chief Complaint:   Routine Prenatal Visit  History of Present Illness:   Jessica Ward Shapley is a 27 y.o. [id number] female at 4927w5d with an Estimated Date of Delivery: 02/04/18 being seen today for ongoing management of a low-risk pregnancy.  Today she reports no complaints. Contractions: Irregular. Vag. Bleeding: None.  Movement: Present. denies leaking of fluid. Review of Systems:   Pertinent items are noted in HPI Denies abnormal vaginal discharge w/ itching/odor/irritation, headaches, visual changes, shortness of breath, chest pain, abdominal pain, severe nausea/vomiting, or problems with urination or bowel movements unless otherwise stated above. Pertinent History Reviewed:  Reviewed past medical,surgical, social, obstetrical and family history.  Reviewed problem list, medications and allergies. Physical Assessment:   Vitals:   12/22/17 1032  BP: 110/62  Pulse: 96  Weight: 208 lb (94.3 kg)  Body mass index is 35.7 kg/m.        Physical Examination:   General appearance: Well appearing, and in no distress  Mental status: Alert, oriented to person, place, and time  Skin: Warm & dry  Cardiovascular: Normal heart rate noted  Respiratory: Normal respiratory effort, no distress  Abdomen: Soft, gravid, nontender  Pelvic: Cervical exam deferred         Extremities: Edema: Trace  Fetal Status: Fetal Heart Rate (bpm): 151 Fundal Height: 24 cm Movement: Present    Results for orders placed or performed in visit on 12/22/17 (from the past 24 hour(s))  POCT urinalysis dipstick   Collection Time: 12/22/17 10:34 AM  Result Value Ref Range   Color, UA     Clarity, UA     Glucose, UA neg    Bilirubin, UA     Ketones, UA neg    Spec Grav, UA  1.010 - 1.025   Blood, UA trace    pH, UA  5.0 - 8.0   Protein, UA neg    Urobilinogen, UA  0.2 or 1.0 E.U./dL   Nitrite, UA neg    Leukocytes, UA  Negative Negative   Appearance     Odor      Assessment & Plan:  1) Low-risk pregnancy [id number] at 8527w5d with an Estimated Date of Delivery: 02/04/18    Meds: No orders of the defined types were placed in this encounter.  Labs/procedures today: none  Plan:  Continue routine obstetrical care   Reviewed: Preterm labor symptoms and general obstetric precautions including but not limited to vaginal bleeding, contractions, leaking of fluid and fetal movement were reviewed in detail with the patient.  Recommended Tdap at HD/PCP per CDC guidelines. All questions were answered  Follow-up: Return in about 2 weeks (around 01/05/2018) for LROB.  Orders Placed This Encounter  Procedures  . POCT urinalysis dipstick   Cheral MarkerKimberly R Maddisyn Hegwood CNM, [facility] 12/22/2017 10:55 AM

## 2017-12-22 NOTE — Patient Instructions (Signed)
Jessica Ward, I greatly value your feedback.  If you receive a survey following your visit with us today, we appreciate you taking the time to fill it out.  Thanks, Jessica Ward, CNM, WHNP-BC   Call the office [phone]) or go to [facility] if:  You begin to have strong, frequent contractions  Your water breaks.  Sometimes it is a big gush of fluid, sometimes it is just a trickle that keeps getting your panties wet or running down your legs  You have vaginal bleeding.  It is normal to have a small amount of spotting if your cervix was checked.   You don't feel your baby moving like normal.  If you don't, get you something to eat and drink and lay down and focus on feeling your baby move.  You should feel at least 10 movements in 2 hours.  If you don't, you should call the office or go to [facility].    Tdap Vaccine  It is recommended that you get the Tdap vaccine during the third trimester of EACH pregnancy to help protect your baby from getting pertussis (whooping cough)  27-36 weeks is the BEST time to do this so that you can pass the protection on to your baby. During pregnancy is better than after pregnancy, but if you are unable to get it during pregnancy it will be offered at the hospital.   You can get this vaccine at the health department or your family doctor  Everyone who will be around your baby should also be up-to-date on their vaccines. Adults (who are not pregnant) only need 1 dose of Tdap during adulthood.   Third Trimester of Pregnancy The third trimester is from week 29 through week 42, months 7 through 9. The third trimester is a time when the fetus is growing rapidly. At the end of the ninth month, the fetus is about 20 inches in length and weighs 6-10 pounds.  BODY CHANGES Your body goes through many changes during pregnancy. The changes vary from woman to woman.   Your weight will continue to increase. You can expect to gain 25-35 pounds (11-16 kg) by the  end of the pregnancy.  You may begin to get stretch marks on your hips, abdomen, and breasts.  You may urinate more often because the fetus is moving lower into your pelvis and pressing on your bladder.  You may develop or continue to have heartburn as a result of your pregnancy.  You may develop constipation because certain hormones are causing the muscles that push waste through your intestines to slow down.  You may develop hemorrhoids or swollen, bulging veins (varicose veins).  You may have pelvic pain because of the weight gain and pregnancy hormones relaxing your joints between the bones in your pelvis. Backaches may result from overexertion of the muscles supporting your posture.  You may have changes in your hair. These can include thickening of your hair, rapid growth, and changes in texture. Some women also have hair loss during or after pregnancy, or hair that feels dry or thin. Your hair will most likely return to normal after your baby is born.  Your breasts will continue to grow and be tender. A yellow discharge may leak from your breasts called colostrum.  Your belly button may stick out.  You may feel short of breath because of your expanding uterus.  You may notice the fetus "dropping," or moving lower in your abdomen.  You may have a bloody mucus  discharge. This usually occurs a few days to a week before labor begins.  Your cervix becomes thin and soft (effaced) near your due date. WHAT TO EXPECT AT YOUR PRENATAL EXAMS  You will have prenatal exams every 2 weeks until week 36. Then, you will have weekly prenatal exams. During a routine prenatal visit:  You will be weighed to make sure you and the fetus are growing normally.  Your blood pressure is taken.  Your abdomen will be measured to track your baby's growth.  The fetal heartbeat will be listened to.  Any test results from the previous visit will be discussed.  You may have a cervical check near your due  date to see if you have effaced. At around 36 weeks, your caregiver will check your cervix. At the same time, your caregiver will also perform a test on the secretions of the vaginal tissue. This test is to determine if a type of bacteria, Group B streptococcus, is present. Your caregiver will explain this further. Your caregiver may ask you:  What your birth plan is.  How you are feeling.  If you are feeling the baby move.  If you have had any abnormal symptoms, such as leaking fluid, bleeding, severe headaches, or abdominal cramping.  If you have any questions. Other tests or screenings that may be performed during your third trimester include:  Blood tests that check for low iron levels (anemia).  Fetal testing to check the health, activity level, and growth of the fetus. Testing is done if you have certain medical conditions or if there are problems during the pregnancy. FALSE LABOR You may feel small, irregular contractions that eventually go away. These are called Braxton Hicks contractions, or false labor. Contractions may last for hours, days, or even weeks before true labor sets in. If contractions come at regular intervals, intensify, or become painful, it is best to be seen by your caregiver.  SIGNS OF LABOR   Menstrual-like cramps.  Contractions that are 5 minutes apart or less.  Contractions that start on the top of the uterus and spread down to the lower abdomen and back.  A sense of increased pelvic pressure or back pain.  A watery or bloody mucus discharge that comes from the vagina. If you have any of these signs before the 37th week of pregnancy, call your caregiver right away. You need to go to the hospital to get checked immediately. HOME CARE INSTRUCTIONS   Avoid all smoking, herbs, alcohol, and unprescribed drugs. These chemicals affect the formation and growth of the baby.  Follow your caregiver's instructions regarding medicine use. There are medicines that  are either safe or unsafe to take during pregnancy.  Exercise only as directed by your caregiver. Experiencing uterine cramps is a good sign to stop exercising.  Continue to eat regular, healthy meals.  Wear a good support bra for breast tenderness.  Do not use hot tubs, steam rooms, or saunas.  Wear your seat belt at all times when driving.  Avoid raw meat, uncooked cheese, cat litter boxes, and soil used by cats. These carry germs that can cause birth defects in the baby.  Take your prenatal vitamins.  Try taking a stool softener (if your caregiver approves) if you develop constipation. Eat more high-fiber foods, such as fresh vegetables or fruit and whole grains. Drink plenty of fluids to keep your urine clear or pale yellow.  Take warm sitz baths to soothe any pain or discomfort caused by hemorrhoids. Use  hemorrhoid cream if your caregiver approves.  If you develop varicose veins, wear support hose. Elevate your feet for 15 minutes, 3-4 times a day. Limit salt in your diet.  Avoid heavy lifting, wear low heal shoes, and practice good posture.  Rest a lot with your legs elevated if you have leg cramps or low back pain.  Visit your dentist if you have not gone during your pregnancy. Use a soft toothbrush to brush your teeth and be gentle when you floss.  A sexual relationship may be continued unless your caregiver directs you otherwise.  Do not travel far distances unless it is absolutely necessary and only with the approval of your caregiver.  Take prenatal classes to understand, practice, and ask questions about the labor and delivery.  Make a trial run to the hospital.  Pack your hospital bag.  Prepare the baby's nursery.  Continue to go to all your prenatal visits as directed by your caregiver. SEEK MEDICAL CARE IF:  You are unsure if you are in labor or if your water has broken.  You have dizziness.  You have mild pelvic cramps, pelvic pressure, or nagging pain  in your abdominal area.  You have persistent nausea, vomiting, or diarrhea.  You have a bad smelling vaginal discharge.  You have pain with urination. SEEK IMMEDIATE MEDICAL CARE IF:   You have a fever.  You are leaking fluid from your vagina.  You have spotting or bleeding from your vagina.  You have severe abdominal cramping or pain.  You have rapid weight loss or gain.  You have shortness of breath with chest pain.  You notice sudden or extreme swelling of your face, hands, ankles, feet, or legs.  You have not felt your baby move in over an hour.  You have severe headaches that do not go away with medicine.  You have vision changes. Document Released: 09/30/2001 Document Revised: 10/11/2013 Document Reviewed: 12/07/2012 [facility] Patient Information 2015 [place], [place]. This information is not intended to replace advice given to you by your health care provider. Make sure you discuss any questions you have with your health care provider.

## 2018-01-06 ENCOUNTER — Ambulatory Visit (INDEPENDENT_AMBULATORY_CARE_PROVIDER_SITE_OTHER): Payer: Medicaid Other | Admitting: Advanced Practice Midwife

## 2018-01-06 ENCOUNTER — Encounter: Payer: Self-pay | Admitting: Advanced Practice Midwife

## 2018-01-06 VITALS — BP 110/60 | HR 98 | Wt 209.0 lb

## 2018-01-06 DIAGNOSIS — Z1389 Encounter for screening for other disorder: Secondary | ICD-10-CM

## 2018-01-06 DIAGNOSIS — Z3483 Encounter for supervision of other normal pregnancy, third trimester: Secondary | ICD-10-CM

## 2018-01-06 DIAGNOSIS — Z331 Pregnant state, incidental: Secondary | ICD-10-CM

## 2018-01-06 DIAGNOSIS — Z3A35 35 weeks gestation of pregnancy: Secondary | ICD-10-CM

## 2018-01-06 LAB — POCT URINALYSIS DIPSTICK
Glucose, UA: NEGATIVE
Leukocytes, UA: NEGATIVE
Nitrite, UA: NEGATIVE
RBC UA: NEGATIVE

## 2018-01-06 NOTE — Progress Notes (Signed)
  [id number] Estimated Date of Delivery: 02/04/18  Blood pressure 110/60, pulse 98, weight 209 lb (94.8 kg), last menstrual period 04/30/2017.   BP weight and urine results all reviewed and noted.  Please refer to the obstetrical flow sheet for the fundal height and fetal heart rate documentation:  Patient reports good fetal movement, denies any bleeding and no rupture of membranes symptoms or regular contractions. Patient is without complaints other than normal pregnancy complaints.  All questions were answered.   Physical Assessment:   Vitals:   01/06/18 1021  BP: 110/60  Pulse: 98  Weight: 209 lb (94.8 kg)  Body mass index is 35.87 kg/m.        Physical Examination:   General appearance: Well appearing, and in no distress  Mental status: Alert, oriented to person, place, and time  Skin: Warm & dry  Cardiovascular: Normal heart rate noted  Respiratory: Normal respiratory effort, no distress  Abdomen: Soft, gravid, nontender  Pelvic: Cervical exam deferred         Extremities: Edema: None  Fetal Status:     Movement: Present    Results for orders placed or performed in visit on 01/06/18 (from the past 24 hour(s))  POCT urinalysis dipstick   Collection Time: 01/06/18 10:27 AM  Result Value Ref Range   Color, UA     Clarity, UA     Glucose, UA neg    Bilirubin, UA     Ketones, UA small    Spec Grav, UA  1.010 - 1.025   Blood, UA neg    pH, UA  5.0 - 8.0   Protein, UA trace    Urobilinogen, UA  0.2 or 1.0 E.U./dL   Nitrite, UA neg    Leukocytes, UA Negative Negative   Appearance     Odor       Orders Placed This Encounter  Procedures  . POCT urinalysis dipstick    Plan:  Continued routine obstetrical care,   Return in about 1 week (around 01/13/2018) for LROB.

## 2018-01-06 NOTE — Patient Instructions (Addendum)
AM I IN LABOR? What is labor? Labor is the work that your body does to birth your baby. Your uterus (the womb) contracts. Your cervix (the mouth of the uterus) opens. You will push your baby out into the world.  What do contractions (labor pains) feel like? When they first start, contractions usually feel like cramps during your period. Sometimes you feel pain in your back. Most often, contractions feel like muscles pulling painfully in your lower belly. At first, the contractions will probably be 15 to 20 minutes apart. They will not feel too painful. As labor goes on, the contractions get stronger, closer together, and more painful.  How do I time the contractions? Time your contractions by counting the number of minutes from the start of one contraction to the start of the next contraction.  What should I do when the contractions start? If it is night and you can sleep, sleep. If it happens during the day, here are some things you can do to take care of yourself at home: ? Walk. If the pains you are having are real labor, walking will make the contractions come faster and harder. If the contractions are not going to continue and be real labor, walking will make the contractions slow down. ? Take a shower or bath. This will help you relax. ? Eat. Labor is a big event. It takes a lot of energy. ? Drink water. Not drinking enough water can cause false labor (contractions that hurt but do not open your cervix). If this is true labor, drinking water will help you have strength to get through your labor. ? Take a nap. Get all the rest you can. ? Get a massage. If your labor is in your back, a strong massage on your lower back may feel very good. Getting a foot massage is always good. ? Don't panic. You can do this. Your body was made for this. You are strong!  When should I go to the hospital or call my health care provider? ? Your contractions have been 5 minutes apart or less for at least 1  hour. ? If several contractions are so painful you cannot walk or talk during one. ? Your bag of waters breaks. (You may have a big gush of water or just water that runs down your legs when you walk.)  Are there other reasons to call my health care provider? Yes, you should call your health care provider or go to the hospital if you start to bleed like you are having a period- blood that soaks your underwear or runs down your legs, if you have sudden severe pain, if your baby has not moved for several hours, or if you are leaking green fluid. The rule is as follows: If you are very concerned about something, call.   Kinesiology taping for pregnancy:  Youtube has good vidoes of "how tos" for lower back, pelvic, hip pain; swelling of feet, etc

## 2018-01-11 ENCOUNTER — Encounter (HOSPITAL_COMMUNITY): Payer: Self-pay | Admitting: *Deleted

## 2018-01-11 ENCOUNTER — Inpatient Hospital Stay (HOSPITAL_COMMUNITY)
Admission: AD | Admit: 2018-01-11 | Discharge: 2018-01-11 | Disposition: A | Discharge status: Home / Self Care | Payer: Medicaid Other | Source: Ambulatory Visit | Attending: Obstetrics and Gynecology | Admitting: Obstetrics and Gynecology

## 2018-01-11 DIAGNOSIS — O479 False labor, unspecified: Secondary | ICD-10-CM

## 2018-01-11 DIAGNOSIS — J111 Influenza due to unidentified influenza virus with other respiratory manifestations: Secondary | ICD-10-CM

## 2018-01-11 DIAGNOSIS — Z3A37 37 weeks gestation of pregnancy: Secondary | ICD-10-CM | POA: Diagnosis not present

## 2018-01-11 DIAGNOSIS — Z3483 Encounter for supervision of other normal pregnancy, third trimester: Secondary | ICD-10-CM | POA: Diagnosis not present

## 2018-01-11 NOTE — MAU Note (Signed)
PT SAYS SHE HAS HAD LOWER ABD AND BACK AND LEGS  HURT  SINCE 2 PM   . HAS  CLEAR D/C.    PNC  WITH [facility]- ALL OK .  LAST SEX-  LAST WEEK.    DENIES HSV AND MRSA. VE .  NEXT APPOINTMENT IS  WED

## 2018-01-11 NOTE — MAU Note (Signed)
I have communicated with Ma Hillock. Neill CNM and reviewed vital signs:  Vitals:   01/11/18 2205 01/11/18 2254  BP: 103/62 105/70  Pulse: (!) 120 82  Resp: 20 16  Temp: 97.7 F (36.5 C)     Vaginal exam:  Dilation: Closed Effacement (%): 30 Cervical Position: Posterior Exam by:: K. WeissRN,   Also reviewed contraction pattern and that non-stress test is reactive.  It has been documented that patient is contracting irregularly  with no cervical change Patient denies any other complaints.  Based on this report provider has given order for discharge.  A discharge order and diagnosis entered by a provider.   Labor discharge instructions reviewed with patient.

## 2018-01-11 NOTE — Discharge Instructions (Signed)
Braxton Hicks Contractions °Contractions of the uterus can occur throughout pregnancy, but they are not always a sign that you are in labor. You may have practice contractions called Braxton Hicks contractions. These false labor contractions are sometimes confused with true labor. °What are Braxton Hicks contractions? °Braxton Hicks contractions are tightening movements that occur in the muscles of the uterus before labor. Unlike true labor contractions, these contractions do not result in opening (dilation) and thinning of the cervix. Toward the end of pregnancy (32-34 weeks), Braxton Hicks contractions can happen more often and may become stronger. These contractions are sometimes difficult to tell apart from true labor because they can be very uncomfortable. You should not feel embarrassed if you go to the hospital with false labor. °Sometimes, the only way to tell if you are in true labor is for your health care provider to look for changes in the cervix. The health care provider will do a physical exam and may monitor your contractions. If you are not in true labor, the exam should show that your cervix is not dilating and your water has not broken. °If there are other health problems associated with your pregnancy, it is completely safe for you to be sent home with false labor. You may continue to have Braxton Hicks contractions until you go into true labor. °How to tell the difference between true labor and false labor °True labor °· Contractions last 30-70 seconds. °· Contractions become very regular. °· Discomfort is usually felt in the top of the uterus, and it spreads to the lower abdomen and low back. °· Contractions do not go away with walking. °· Contractions usually become more intense and increase in frequency. °· The cervix dilates and gets thinner. °False labor °· Contractions are usually shorter and not as strong as true labor contractions. °· Contractions are usually irregular. °· Contractions  are often felt in the front of the lower abdomen and in the groin. °· Contractions may go away when you walk around or change positions while lying down. °· Contractions get weaker and are shorter-lasting as time goes on. °· The cervix usually does not dilate or become thin. °Follow these instructions at home: °· Take over-the-counter and prescription medicines only as told by your health care provider. °· Keep up with your usual exercises and follow other instructions from your health care provider. °· Eat and drink lightly if you think you are going into labor. °· If Braxton Hicks contractions are making you uncomfortable: °? Change your position from lying down or resting to walking, or change from walking to resting. °? Sit and rest in a tub of warm water. °? Drink enough fluid to keep your urine pale yellow. Dehydration may cause these contractions. °? Do slow and deep breathing several times an hour. °· Keep all follow-up prenatal visits as told by your health care provider. This is important. °Contact a health care provider if: °· You have a fever. °· You have continuous pain in your abdomen. °Get help right away if: °· Your contractions become stronger, more regular, and closer together. °· You have fluid leaking or gushing from your vagina. °· You pass blood-tinged mucus (bloody show). °· You have bleeding from your vagina. °· You have low back pain that you never had before. °· You feel your baby’s head pushing down and causing pelvic pressure. °· Your baby is not moving inside you as much as it used to. °Summary °· Contractions that occur before labor are called Braxton   Hicks contractions, false labor, or practice contractions. °· Braxton Hicks contractions are usually shorter, weaker, farther apart, and less regular than true labor contractions. True labor contractions usually become progressively stronger and regular and they become more frequent. °· Manage discomfort from Braxton Hicks contractions by  changing position, resting in a warm bath, drinking plenty of water, or practicing deep breathing. °This information is not intended to replace advice given to you by your health care provider. Make sure you discuss any questions you have with your health care provider. °Document Released: 02/19/2017 Document Revised: 02/19/2017 Document Reviewed: 02/19/2017 °Elsevier Interactive Patient Education © 2018 [employer] ° °

## 2018-01-13 ENCOUNTER — Ambulatory Visit (INDEPENDENT_AMBULATORY_CARE_PROVIDER_SITE_OTHER): Payer: Medicaid Other | Admitting: Advanced Practice Midwife

## 2018-01-13 ENCOUNTER — Encounter: Payer: Self-pay | Admitting: Advanced Practice Midwife

## 2018-01-13 VITALS — BP 100/70 | HR 98 | Wt 208.4 lb

## 2018-01-13 DIAGNOSIS — Z1389 Encounter for screening for other disorder: Secondary | ICD-10-CM

## 2018-01-13 DIAGNOSIS — R3121 Asymptomatic microscopic hematuria: Secondary | ICD-10-CM

## 2018-01-13 DIAGNOSIS — Z3A36 36 weeks gestation of pregnancy: Secondary | ICD-10-CM

## 2018-01-13 DIAGNOSIS — Z331 Pregnant state, incidental: Secondary | ICD-10-CM

## 2018-01-13 DIAGNOSIS — Z3483 Encounter for supervision of other normal pregnancy, third trimester: Secondary | ICD-10-CM

## 2018-01-13 LAB — POCT URINALYSIS DIPSTICK
Blood, UA: 3
Glucose, UA: NEGATIVE
Ketones, UA: NEGATIVE
LEUKOCYTES UA: NEGATIVE
NITRITE UA: NEGATIVE
PROTEIN UA: NEGATIVE

## 2018-01-13 LAB — OB RESULTS CONSOLE GBS: GBS: POSITIVE

## 2018-01-13 NOTE — Addendum Note (Signed)
Addended by: Sherre LainASH, AMANDA A on: 01/13/2018 11:20 AM   Modules accepted: Orders

## 2018-01-13 NOTE — Progress Notes (Signed)
  [id number] Estimated Date of Delivery: 02/04/18  Blood pressure 100/70, pulse 98, weight 208 lb 6.4 oz (94.5 kg), last menstrual period 04/30/2017.   BP weight and urine results all reviewed and noted.  Please refer to the obstetrical flow sheet for the fundal height and fetal heart rate documentation:  Patient reports good fetal movement, denies any bleeding and no rupture of membranes symptoms or regular contractions. Patient is without complaints. All questions were answered.   Physical Assessment:   Vitals:   01/13/18 1035  BP: 100/70  Pulse: 98  Weight: 208 lb 6.4 oz (94.5 kg)  Body mass index is 34.68 kg/m.        Physical Examination:   General appearance: Well appearing, and in no distress  Mental status: Alert, oriented to person, place, and time  Skin: Warm & dry  Cardiovascular: Normal heart rate noted  Respiratory: Normal respiratory effort, no distress  Abdomen: Soft, gravid, nontender  Pelvic: Cervical exam performed  Dilation: 1.5 Effacement (%): 50 Station: -2  Extremities: Edema: None  Fetal Status: Fetal Heart Rate (bpm): 150 Fundal Height: 36 cm Movement: Present Presentation: Vertex  Results for orders placed or performed in visit on 01/13/18 (from the past 24 hour(s))  POCT urinalysis dipstick   Collection Time: 01/13/18 10:48 AM  Result Value Ref Range   Color, UA     Clarity, UA     Glucose, UA neg    Bilirubin, UA     Ketones, UA neg    Spec Grav, UA  1.010 - 1.025   Blood, UA 3    pH, UA  5.0 - 8.0   Protein, UA neg    Urobilinogen, UA  0.2 or 1.0 E.U./dL   Nitrite, UA neg    Leukocytes, UA Negative Negative   Appearance     Odor       Orders Placed This Encounter  Procedures  . Urine Culture  . POCT urinalysis dipstick    Plan:  Continued routine obstetrical care, urine culture d/t blood  No s/s of UTI  Return in about 1 week (around 01/20/2018) for LROB.

## 2018-01-15 ENCOUNTER — Inpatient Hospital Stay (HOSPITAL_COMMUNITY)
Admission: AD | Admit: 2018-01-15 | Discharge: 2018-01-16 | Disposition: A | Discharge status: Home / Self Care | Payer: Medicaid Other | Source: Ambulatory Visit | Attending: Obstetrics and Gynecology | Admitting: Obstetrics and Gynecology

## 2018-01-15 ENCOUNTER — Encounter (HOSPITAL_COMMUNITY): Payer: Self-pay

## 2018-01-15 DIAGNOSIS — Z3483 Encounter for supervision of other normal pregnancy, third trimester: Secondary | ICD-10-CM | POA: Insufficient documentation

## 2018-01-15 DIAGNOSIS — Z3A38 38 weeks gestation of pregnancy: Secondary | ICD-10-CM | POA: Insufficient documentation

## 2018-01-15 DIAGNOSIS — O479 False labor, unspecified: Secondary | ICD-10-CM

## 2018-01-15 LAB — URINE CULTURE

## 2018-01-15 LAB — GC/CHLAMYDIA PROBE AMP
CHLAMYDIA, DNA PROBE: NEGATIVE
NEISSERIA GONORRHOEAE BY PCR: NEGATIVE

## 2018-01-15 NOTE — MAU Note (Signed)
CTX since 0930, unsure of how far apart.  No LOF/VB.  Reports good FM.  Last VE 1.5 cm.

## 2018-01-16 DIAGNOSIS — Z3483 Encounter for supervision of other normal pregnancy, third trimester: Secondary | ICD-10-CM | POA: Diagnosis not present

## 2018-01-16 DIAGNOSIS — Z3A38 38 weeks gestation of pregnancy: Secondary | ICD-10-CM | POA: Diagnosis not present

## 2018-01-16 LAB — CULTURE, BETA STREP (GROUP B ONLY): Strep Gp B Culture: POSITIVE — AB

## 2018-01-16 NOTE — MAU Note (Signed)
I have communicated with Dr. Linwood Dibblesumball and reviewed vital signs:  Vitals:   01/15/18 2337  BP: 115/78  Pulse: (!) 109  Resp: 19  Temp: 98.4 F (36.9 C)    Vaginal exam:  Dilation: 1.5 Cervical Position: Middle Station: -2 Presentation: Vertex Exam by:: Latricia HeftAnna Joline Encalada, RN,   Also reviewed contraction pattern and that non-stress test is reactive.  It has been documented that patient is contracting every 2-5 minutes with no cervical change since her last exam 2 days ago, not indicating active labor.  Patient denies any other complaints.  Based on this report provider has given order for discharge.  A discharge order and diagnosis entered by a provider.   Labor discharge instructions reviewed with patient.

## 2018-01-16 NOTE — Discharge Instructions (Signed)
Braxton Hicks Contractions °Contractions of the uterus can occur throughout pregnancy, but they are not always a sign that you are in labor. You may have practice contractions called Braxton Hicks contractions. These false labor contractions are sometimes confused with true labor. °What are Braxton Hicks contractions? °Braxton Hicks contractions are tightening movements that occur in the muscles of the uterus before labor. Unlike true labor contractions, these contractions do not result in opening (dilation) and thinning of the cervix. Toward the end of pregnancy (32-34 weeks), Braxton Hicks contractions can happen more often and may become stronger. These contractions are sometimes difficult to tell apart from true labor because they can be very uncomfortable. You should not feel embarrassed if you go to the hospital with false labor. °Sometimes, the only way to tell if you are in true labor is for your health care provider to look for changes in the cervix. The health care provider will do a physical exam and may monitor your contractions. If you are not in true labor, the exam should show that your cervix is not dilating and your water has not broken. °If there are other health problems associated with your pregnancy, it is completely safe for you to be sent home with false labor. You may continue to have Braxton Hicks contractions until you go into true labor. °How to tell the difference between true labor and false labor °True labor °· Contractions last 30-70 seconds. °· Contractions become very regular. °· Discomfort is usually felt in the top of the uterus, and it spreads to the lower abdomen and low back. °· Contractions do not go away with walking. °· Contractions usually become more intense and increase in frequency. °· The cervix dilates and gets thinner. °False labor °· Contractions are usually shorter and not as strong as true labor contractions. °· Contractions are usually irregular. °· Contractions  are often felt in the front of the lower abdomen and in the groin. °· Contractions may go away when you walk around or change positions while lying down. °· Contractions get weaker and are shorter-lasting as time goes on. °· The cervix usually does not dilate or become thin. °Follow these instructions at home: °· Take over-the-counter and prescription medicines only as told by your health care provider. °· Keep up with your usual exercises and follow other instructions from your health care provider. °· Eat and drink lightly if you think you are going into labor. °· If Braxton Hicks contractions are making you uncomfortable: °? Change your position from lying down or resting to walking, or change from walking to resting. °? Sit and rest in a tub of warm water. °? Drink enough fluid to keep your urine pale yellow. Dehydration may cause these contractions. °? Do slow and deep breathing several times an hour. °· Keep all follow-up prenatal visits as told by your health care provider. This is important. °Contact a health care provider if: °· You have a fever. °· You have continuous pain in your abdomen. °Get help right away if: °· Your contractions become stronger, more regular, and closer together. °· You have fluid leaking or gushing from your vagina. °· You pass blood-tinged mucus (bloody show). °· You have bleeding from your vagina. °· You have low back pain that you never had before. °· You feel your baby’s head pushing down and causing pelvic pressure. °· Your baby is not moving inside you as much as it used to. °Summary °· Contractions that occur before labor are called Braxton   Hicks contractions, false labor, or practice contractions. °· Braxton Hicks contractions are usually shorter, weaker, farther apart, and less regular than true labor contractions. True labor contractions usually become progressively stronger and regular and they become more frequent. °· Manage discomfort from Braxton Hicks contractions by  changing position, resting in a warm bath, drinking plenty of water, or practicing deep breathing. °This information is not intended to replace advice given to you by your health care provider. Make sure you discuss any questions you have with your health care provider. °Document Released: 02/19/2017 Document Revised: 02/19/2017 Document Reviewed: 02/19/2017 °Elsevier Interactive Patient Education © 2018 [employer] ° °

## 2018-01-18 ENCOUNTER — Telehealth: Payer: Self-pay | Admitting: *Deleted

## 2018-01-18 ENCOUNTER — Encounter (HOSPITAL_COMMUNITY): Payer: Self-pay

## 2018-01-18 ENCOUNTER — Ambulatory Visit (INDEPENDENT_AMBULATORY_CARE_PROVIDER_SITE_OTHER): Payer: Medicaid Other | Admitting: Women's Health

## 2018-01-18 ENCOUNTER — Encounter: Payer: Self-pay | Admitting: Women's Health

## 2018-01-18 ENCOUNTER — Inpatient Hospital Stay (HOSPITAL_COMMUNITY)
Admission: AD | Admit: 2018-01-18 | Discharge: 2018-01-18 | Disposition: A | Discharge status: Home / Self Care | Payer: Medicaid Other | Source: Ambulatory Visit | Attending: Obstetrics & Gynecology | Admitting: Obstetrics & Gynecology

## 2018-01-18 VITALS — BP 112/60 | HR 98 | Wt 214.0 lb

## 2018-01-18 DIAGNOSIS — O471 False labor at or after 37 completed weeks of gestation: Secondary | ICD-10-CM | POA: Insufficient documentation

## 2018-01-18 DIAGNOSIS — Z3A37 37 weeks gestation of pregnancy: Secondary | ICD-10-CM

## 2018-01-18 DIAGNOSIS — O368130 Decreased fetal movements, third trimester, not applicable or unspecified: Secondary | ICD-10-CM | POA: Diagnosis not present

## 2018-01-18 DIAGNOSIS — Z3A38 38 weeks gestation of pregnancy: Secondary | ICD-10-CM | POA: Insufficient documentation

## 2018-01-18 DIAGNOSIS — O479 False labor, unspecified: Secondary | ICD-10-CM

## 2018-01-18 DIAGNOSIS — R102 Pelvic and perineal pain: Secondary | ICD-10-CM

## 2018-01-18 DIAGNOSIS — R319 Hematuria, unspecified: Secondary | ICD-10-CM

## 2018-01-18 DIAGNOSIS — Z331 Pregnant state, incidental: Secondary | ICD-10-CM

## 2018-01-18 DIAGNOSIS — Z3483 Encounter for supervision of other normal pregnancy, third trimester: Secondary | ICD-10-CM

## 2018-01-18 DIAGNOSIS — O9989 Other specified diseases and conditions complicating pregnancy, childbirth and the puerperium: Secondary | ICD-10-CM

## 2018-01-18 DIAGNOSIS — Z1389 Encounter for screening for other disorder: Secondary | ICD-10-CM

## 2018-01-18 LAB — POCT URINALYSIS DIPSTICK
GLUCOSE UA: NEGATIVE
Ketones, UA: NEGATIVE
Leukocytes, UA: NEGATIVE
Nitrite, UA: NEGATIVE
Protein, UA: NEGATIVE

## 2018-01-18 NOTE — Telephone Encounter (Signed)
Patient called stating she has hurting all night and the pain is getting worse.  Describes the pain as sharp and in the lower part of her abdomen and progressively getting worse.  She is not bleeding or leaking. Has tried Tylenol and warm baths with no relief. She went to the hospital this weekend for contractions but was still dilated 1.5 after several hours. Patient states she is hurting so bad she can barely move and cannot go on like this.  Informed patient that unless she is making cervical change she is not in labor despite the fact that she may be hurting.  Advised she could come in for cervical exam but unless she had made change, she would be sent home.  Informed that she could contract for days or even weeks and could be uncomfortable but unless her cervix was changing, she was not in labor.  Patient then states she has not felt the baby move since 4am.  Asked if she had eaten anything since getting up and patient stated she had not because she was hurting.  Advised patient to come in for cervical check and FHT.  Verbalized understanding.

## 2018-01-18 NOTE — Patient Instructions (Signed)
Jessica Ward, I greatly value your feedback.  If you receive a survey following your visit with us today, we appreciate you taking the time to fill it out.  Thanks, Kim Chandra Feger, CNM, WHNP-BC   Call the office ([phone]) or go to [facility] if:  You begin to have strong, frequent contractions  Your water breaks.  Sometimes it is a big gush of fluid, sometimes it is just a trickle that keeps getting your panties wet or running down your legs  You have vaginal bleeding.  It is normal to have a small amount of spotting if your cervix was checked.   You don't feel your baby moving like normal.  If you don't, get you something to eat and drink and lay down and focus on feeling your baby move.  You should feel at least 10 movements in 2 hours.  If you don't, you should call the office or go to [facility].     Braxton Hicks Contractions Contractions of the uterus can occur throughout pregnancy, but they are not always a sign that you are in labor. You may have practice contractions called Braxton Hicks contractions. These false labor contractions are sometimes confused with true labor. What are Braxton Hicks contractions? Braxton Hicks contractions are tightening movements that occur in the muscles of the uterus before labor. Unlike true labor contractions, these contractions do not result in opening (dilation) and thinning of the cervix. Toward the end of pregnancy (32-34 weeks), Braxton Hicks contractions can happen more often and may become stronger. These contractions are sometimes difficult to tell apart from true labor because they can be very uncomfortable. You should not feel embarrassed if you go to the hospital with false labor. Sometimes, the only way to tell if you are in true labor is for your health care provider to look for changes in the cervix. The health care provider will do a physical exam and may monitor your contractions. If you are not in true labor, the exam should show  that your cervix is not dilating and your water has not broken. If there are other health problems associated with your pregnancy, it is completely safe for you to be sent home with false labor. You may continue to have Braxton Hicks contractions until you go into true labor. How to tell the difference between true labor and false labor True labor  Contractions last 30-70 seconds.  Contractions become very regular.  Discomfort is usually felt in the top of the uterus, and it spreads to the lower abdomen and low back.  Contractions do not go away with walking.  Contractions usually become more intense and increase in frequency.  The cervix dilates and gets thinner. False labor  Contractions are usually shorter and not as strong as true labor contractions.  Contractions are usually irregular.  Contractions are often felt in the front of the lower abdomen and in the groin.  Contractions may go away when you walk around or change positions while lying down.  Contractions get weaker and are shorter-lasting as time goes on.  The cervix usually does not dilate or become thin. Follow these instructions at home:  Take over-the-counter and prescription medicines only as told by your health care provider.  Keep up with your usual exercises and follow other instructions from your health care provider.  Eat and drink lightly if you think you are going into labor.  If Braxton Hicks contractions are making you uncomfortable: ? Change your position from lying down   or resting to walking, or change from walking to resting. ? Sit and rest in a tub of warm water. ? Drink enough fluid to keep your urine pale yellow. Dehydration may cause these contractions. ? Do slow and deep breathing several times an hour.  Keep all follow-up prenatal visits as told by your health care provider. This is important. Contact a health care provider if:  You have a fever.  You have continuous pain in your  abdomen. Get help right away if:  Your contractions become stronger, more regular, and closer together.  You have fluid leaking or gushing from your vagina.  You pass blood-tinged mucus (bloody show).  You have bleeding from your vagina.  You have low back pain that you never had before.  You feel your baby's head pushing down and causing pelvic pressure.  Your baby is not moving inside you as much as it used to. Summary  Contractions that occur before labor are called Braxton Hicks contractions, false labor, or practice contractions.  Braxton Hicks contractions are usually shorter, weaker, farther apart, and less regular than true labor contractions. True labor contractions usually become progressively stronger and regular and they become more frequent.  Manage discomfort from [facility] contractions by changing position, resting in a warm bath, drinking plenty of water, or practicing deep breathing. This information is not intended to replace advice given to you by your health care provider. Make sure you discuss any questions you have with your health care provider. Document Released: 02/19/2017 Document Revised: 02/19/2017 Document Reviewed: 02/19/2017 Elsevier Interactive Patient Education  2018 [employer].

## 2018-01-18 NOTE — Progress Notes (Signed)
   Work-in LOW-RISK PREGNANCY VISIT Patient name: Jessica Ward Goody MRN [id number]  Date of birth: [birth year] Chief Complaint:   work in ob (pelvic pain, back pain, decreased baby movement; started 4 pm yesterday)  History of Present Illness:   Jessica Ward Lewallen is a 27 y.o. [phone] female at 2583w4d with an Estimated Date of Delivery: 02/04/18 being seen today for ongoing management of a low-risk pregnancy.  Today she is being seen as a work-in for report of uc's since 4pm yesterday. Also reports decreased fm since last night. Pelvic pain when standing, changing positions. Went to MAU Sat for labor check, was 1.5cm. Contractions: Regular. Vag. Bleeding: None.  Movement: (!) Decreased. denies leaking of fluid. Review of Systems:   Pertinent items are noted in HPI Denies abnormal vaginal discharge w/ itching/odor/irritation, headaches, visual changes, shortness of breath, chest pain, abdominal pain, severe nausea/vomiting, or problems with urination or bowel movements unless otherwise stated above. Pertinent History Reviewed:  Reviewed past medical,surgical, social, obstetrical and family history.  Reviewed problem list, medications and allergies. Physical Assessment:   Vitals:   01/18/18 0934  BP: 112/60  Pulse: 98  Weight: 214 lb (97.1 kg)  Body mass index is 35.61 kg/m.        Physical Examination:   General appearance: Well appearing, and in no distress  Mental status: Alert, oriented to person, place, and time  Skin: Warm & dry  Cardiovascular: Normal heart rate noted  Respiratory: Normal respiratory effort, no distress  Abdomen: Soft, gravid, nontender  Pelvic: Cervical exam performed  Dilation: 4 Effacement (%): 50 Station: -2  Extremities: Edema: Trace  Fetal Status: Fetal Heart Rate (bpm): 155 Fundal Height: 37 cm Movement: (!) Decreased Presentation: Vertex   NST: FHR baseline 155 bpm, Variability: moderate, Accelerations:present, Decelerations:  Absent= Cat 1/Reactive Toco: q  2-775mins    Results for orders placed or performed in visit on 01/18/18 (from the past 24 hour(s))  POCT urinalysis dipstick   Collection Time: 01/18/18  9:35 AM  Result Value Ref Range   Color, UA     Clarity, UA     Glucose, UA neg    Bilirubin, UA     Ketones, UA neg    Spec Grav, UA  1.010 - 1.025   Blood, UA 3+    pH, UA  5.0 - 8.0   Protein, UA neg    Urobilinogen, UA  0.2 or 1.0 E.U./dL   Nitrite, UA neg    Leukocytes, UA Negative Negative   Appearance     Odor      Assessment & Plan:  1) Low-risk pregnancy [id number] at 6983w4d with an Estimated Date of Delivery: 02/04/18   2) Early labor, go to MAU for worsening uc's, lof, vb  3) Decreased fm> reactive NST  4) Hematuria> 3+, same as last visit, urine cx neg then, pt states she does have kidney stone, hasn't been giving her any problems   Meds: No orders of the defined types were placed in this encounter.  Labs/procedures today: nst, sve  Plan:  Continue routine obstetrical care   Reviewed: Term labor symptoms and general obstetric precautions including but not limited to vaginal bleeding, contractions, leaking of fluid and fetal movement were reviewed in detail with the patient.  All questions were answered  Follow-up: Return for As scheduled Thurs for LROB.  Orders Placed This Encounter  Procedures  . POCT urinalysis dipstick   Cheral MarkerKimberly R Abdulah Iqbal CNM, [facility]NP-BC 01/18/2018 10:13 AM

## 2018-01-18 NOTE — Discharge Instructions (Signed)
Braxton Hicks Contractions °Contractions of the uterus can occur throughout pregnancy, but they are not always a sign that you are in labor. You may have practice contractions called Braxton Hicks contractions. These false labor contractions are sometimes confused with true labor. °What are Braxton Hicks contractions? °Braxton Hicks contractions are tightening movements that occur in the muscles of the uterus before labor. Unlike true labor contractions, these contractions do not result in opening (dilation) and thinning of the cervix. Toward the end of pregnancy (32-34 weeks), Braxton Hicks contractions can happen more often and may become stronger. These contractions are sometimes difficult to tell apart from true labor because they can be very uncomfortable. You should not feel embarrassed if you go to the hospital with false labor. °Sometimes, the only way to tell if you are in true labor is for your health care provider to look for changes in the cervix. The health care provider will do a physical exam and may monitor your contractions. If you are not in true labor, the exam should show that your cervix is not dilating and your water has not broken. °If there are other health problems associated with your pregnancy, it is completely safe for you to be sent home with false labor. You may continue to have Braxton Hicks contractions until you go into true labor. °How to tell the difference between true labor and false labor °True labor °· Contractions last 30-70 seconds. °· Contractions become very regular. °· Discomfort is usually felt in the top of the uterus, and it spreads to the lower abdomen and low back. °· Contractions do not go away with walking. °· Contractions usually become more intense and increase in frequency. °· The cervix dilates and gets thinner. °False labor °· Contractions are usually shorter and not as strong as true labor contractions. °· Contractions are usually irregular. °· Contractions  are often felt in the front of the lower abdomen and in the groin. °· Contractions may go away when you walk around or change positions while lying down. °· Contractions get weaker and are shorter-lasting as time goes on. °· The cervix usually does not dilate or become thin. °Follow these instructions at home: °· Take over-the-counter and prescription medicines only as told by your health care provider. °· Keep up with your usual exercises and follow other instructions from your health care provider. °· Eat and drink lightly if you think you are going into labor. °· If Braxton Hicks contractions are making you uncomfortable: °? Change your position from lying down or resting to walking, or change from walking to resting. °? Sit and rest in a tub of warm water. °? Drink enough fluid to keep your urine pale yellow. Dehydration may cause these contractions. °? Do slow and deep breathing several times an hour. °· Keep all follow-up prenatal visits as told by your health care provider. This is important. °Contact a health care provider if: °· You have a fever. °· You have continuous pain in your abdomen. °Get help right away if: °· Your contractions become stronger, more regular, and closer together. °· You have fluid leaking or gushing from your vagina. °· You pass blood-tinged mucus (bloody show). °· You have bleeding from your vagina. °· You have low back pain that you never had before. °· You feel your baby’s head pushing down and causing pelvic pressure. °· Your baby is not moving inside you as much as it used to. °Summary °· Contractions that occur before labor are called Braxton   Hicks contractions, false labor, or practice contractions. °· Braxton Hicks contractions are usually shorter, weaker, farther apart, and less regular than true labor contractions. True labor contractions usually become progressively stronger and regular and they become more frequent. °· Manage discomfort from Braxton Hicks contractions by  changing position, resting in a warm bath, drinking plenty of water, or practicing deep breathing. °This information is not intended to replace advice given to you by your health care provider. Make sure you discuss any questions you have with your health care provider. °Document Released: 02/19/2017 Document Revised: 02/19/2017 Document Reviewed: 02/19/2017 °Elsevier Interactive Patient Education © 2018 Elsevier Inc. ° °Fetal Movement Counts °Patient Name: ________________________________________________ Patient Due Date: ____________________ °What is a fetal movement count? °A fetal movement count is the number of times that you feel your baby move during a certain amount of time. This may also be called a fetal kick count. A fetal movement count is recommended for every pregnant woman. You may be asked to start counting fetal movements as early as week 28 of your pregnancy. °Pay attention to when your baby is most active. You may notice your baby's sleep and wake cycles. You may also notice things that make your baby move more. You should do a fetal movement count: °· When your baby is normally most active. °· At the same time each day. ° °A good time to count movements is while you are resting, after having something to eat and drink. °How do I count fetal movements? °1. Find a quiet, comfortable area. Sit, or lie down on your side. °2. Write down the date, the start time and stop time, and the number of movements that you felt between those two times. Take this information with you to your health care visits. °3. For 2 hours, count kicks, flutters, swishes, rolls, and jabs. You should feel at least 10 movements during 2 hours. °4. You may stop counting after you have felt 10 movements. °5. If you do not feel 10 movements in 2 hours, have something to eat and drink. Then, keep resting and counting for 1 hour. If you feel at least 4 movements during that hour, you may stop counting. °Contact a health care  provider if: °· You feel fewer than 4 movements in 2 hours. °· Your baby is not moving like he or she usually does. °Date: ____________ Start time: ____________ Stop time: ____________ Movements: ____________ °Date: ____________ Start time: ____________ Stop time: ____________ Movements: ____________ °Date: ____________ Start time: ____________ Stop time: ____________ Movements: ____________ °Date: ____________ Start time: ____________ Stop time: ____________ Movements: ____________ °Date: ____________ Start time: ____________ Stop time: ____________ Movements: ____________ °Date: ____________ Start time: ____________ Stop time: ____________ Movements: ____________ °Date: ____________ Start time: ____________ Stop time: ____________ Movements: ____________ °Date: ____________ Start time: ____________ Stop time: ____________ Movements: ____________ °Date: ____________ Start time: ____________ Stop time: ____________ Movements: ____________ °This information is not intended to replace advice given to you by your health care provider. Make sure you discuss any questions you have with your health care provider. °Document Released: 11/05/2006 Document Revised: 06/04/2016 Document Reviewed: 11/15/2015 °Elsevier Interactive Patient Education © 2018 Elsevier Inc. ° °

## 2018-01-18 NOTE — MAU Note (Signed)
I have communicated with Dr. Cyndi LennertHinds and reviewed vital signs:  Vitals:   01/18/18 1743  BP: 126/67  Pulse: 100  Resp: 18  Temp: 97.9 F (36.6 C)    Vaginal exam:  Dilation: 4 Effacement (%): 40, 50 Cervical Position: Posterior Station: -3 Exam by:: Millner, RN ,   Also reviewed contraction pattern and that non-stress test is reactive.  It has been documented that patient is contracting every 2-4 minutes with no cervical change over 1.5 hours not indicating active labor.  Patient denies any other complaints.  Based on this report provider has given order for discharge.  A discharge order and diagnosis entered by a provider.   Labor discharge instructions reviewed with patient.

## 2018-01-18 NOTE — Telephone Encounter (Signed)
Patient's mother called stating her daughter called her crying stating that I was rude to her and disrespectful and she did not appreciate that.  Says that her daughter has been contracting for 5 days and knows that is not normal. Informed her that actually that was normal and unfortunately could go on for several more weeks.  Unless the cervix was making change, she was not in labor and would be sent home.  Mother states that her daughter is very uncomfortable and would like for us to be more considerate.  Reiterated what I explained to her daughter and mother then states "you are not a doctor". 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 Informed mother that the patient was coming to be seen at 9:30 this morning and would be evaluated then.

## 2018-01-18 NOTE — MAU Note (Signed)
Pt presents to MAU with c/o ctx that started yesterday. Pt was seen in office today and was 4 cm. Pt denies VB and LOF. +FM

## 2018-01-20 ENCOUNTER — Encounter: Payer: Self-pay | Admitting: Women's Health

## 2018-01-20 ENCOUNTER — Ambulatory Visit (INDEPENDENT_AMBULATORY_CARE_PROVIDER_SITE_OTHER): Payer: Medicaid Other | Admitting: Women's Health

## 2018-01-20 VITALS — BP 120/72 | HR 108 | Wt 206.5 lb

## 2018-01-20 DIAGNOSIS — Z1389 Encounter for screening for other disorder: Secondary | ICD-10-CM

## 2018-01-20 DIAGNOSIS — Z3483 Encounter for supervision of other normal pregnancy, third trimester: Secondary | ICD-10-CM

## 2018-01-20 DIAGNOSIS — Z3A37 37 weeks gestation of pregnancy: Secondary | ICD-10-CM

## 2018-01-20 DIAGNOSIS — O471 False labor at or after 37 completed weeks of gestation: Secondary | ICD-10-CM

## 2018-01-20 DIAGNOSIS — O1213 Gestational proteinuria, third trimester: Secondary | ICD-10-CM

## 2018-01-20 DIAGNOSIS — Z331 Pregnant state, incidental: Secondary | ICD-10-CM

## 2018-01-20 LAB — POCT URINALYSIS DIPSTICK
GLUCOSE UA: NEGATIVE
Nitrite, UA: NEGATIVE

## 2018-01-20 NOTE — Patient Instructions (Signed)
Charlsie Merlesaylor Coello, I greatly value your feedback.  If you receive a survey following your visit with us today, we appreciate you taking the time to fill it out.  Thanks, Joellyn HaffKim Margrette Wynia, CNM, WHNP-BC   Call the office [phone]) or [facility] if:  You begin to have strong, frequent contractions  Your water breaks.  Sometimes it is a big gush of fluid, sometimes it is just a trickle that keeps getting your panties wet or running down your legs  You have vaginal bleeding.  It is normal to have a small amount of spotting if your cervix was checked.   You don't feel your baby moving like normal.  If you don't, get you something to eat and drink and lay down and focus on feeling your baby move.  You should feel at least 10 movements in 2 hours.  If you don't, you should call the office or [facility].    Call the office [phone]) or [facility] for these signs of pre-eclampsia:  Severe headache that does not go away with Tylenol  Visual changes- seeing spots, double, blurred vision  Pain under your right breast or upper abdomen that does not go away with Tums or heartburn medicine  Nausea and/or vomiting  Severe swelling in your hands, feet, and face      Braxton Hicks Contractions Contractions of the uterus can occur throughout pregnancy, but they are not always a sign that you are in labor. You may have practice contractions called Braxton Hicks contractions. These false labor contractions are sometimes confused with true labor. What are Deberah PeltonBraxton Hicks contractions? Braxton Hicks contractions are tightening movements that occur in the muscles of the uterus before labor. Unlike true labor contractions, these contractions do not result in opening (dilation) and thinning of the cervix. Toward the end of pregnancy (32-34 weeks), Braxton Hicks contractions can happen more often and may become stronger. These contractions are sometimes difficult to tell apart from true  labor because they can be very uncomfortable. You should not feel embarrassed if you go to the hospital with false labor. Sometimes, the only way to tell if you are in true labor is for your health care provider to look for changes in the cervix. The health care provider will do a physical exam and may monitor your contractions. If you are not in true labor, the exam should show that your cervix is not dilating and your water has not broken. If there are other health problems associated with your pregnancy, it is completely safe for you to be sent home with false labor. You may continue to have Braxton Hicks contractions until you go into true labor. How to tell the difference between true labor and false labor True labor  Contractions last 30-70 seconds.  Contractions become very regular.  Discomfort is usually felt in the top of the uterus, and it spreads to the lower abdomen and low back.  Contractions do not go away with walking.  Contractions usually become more intense and increase in frequency.  The cervix dilates and gets thinner. False labor  Contractions are usually shorter and not as strong as true labor contractions.  Contractions are usually irregular.  Contractions are often felt in the front of the lower abdomen and in the groin.  Contractions may go away when you walk around or change positions while lying down.  Contractions get weaker and are shorter-lasting as time goes on.  The cervix usually does not  dilate or become thin. Follow these instructions at home:  Take over-the-counter and prescription medicines only as told by your health care provider.  Keep up with your usual exercises and follow other instructions from your health care provider.  Eat and drink lightly if you think you are going into labor.  If Braxton Hicks contractions are making you uncomfortable: ? Change your position from lying down or resting to walking, or change from walking to  resting. ? Sit and rest in a tub of warm water. ? Drink enough fluid to keep your urine pale yellow. Dehydration may cause these contractions. ? Do slow and deep breathing several times an hour.  Keep all follow-up prenatal visits as told by your health care provider. This is important. Contact a health care provider if:  You have a fever.  You have continuous pain in your abdomen. Get help right away if:  Your contractions become stronger, more regular, and closer together.  You have fluid leaking or gushing from your vagina.  You pass blood-tinged mucus (bloody show).  You have bleeding from your vagina.  You have low back pain that you never had before.  You feel your baby's head pushing down and causing pelvic pressure.  Your baby is not moving inside you as much as it used to. Summary  Contractions that occur before labor are called Braxton Hicks contractions, false labor, or practice contractions.  Braxton Hicks contractions are usually shorter, weaker, farther apart, and less regular than true labor contractions. True labor contractions usually become progressively stronger and regular and they become more frequent.  Manage discomfort from [facility] contractions by changing position, resting in a warm bath, drinking plenty of water, or practicing deep breathing. This information is not intended to replace advice given to you by your health care provider. Make sure you discuss any questions you have with your health care provider. Document Released: 02/19/2017 Document Revised: 02/19/2017 Document Reviewed: 02/19/2017 Elsevier Interactive Patient Education  2018 [employer].

## 2018-01-20 NOTE — Progress Notes (Signed)
   LOW-RISK PREGNANCY VISIT Patient name: Jessica Ward Dirico MRN [id number]  Date of birth: 01-21-1991 Chief Complaint:   ER follow up (contractions, back pain)  History of Present Illness:   Jessica Ward Schwanz is a 27 y.o. [id number] female at 8533w6d with an Estimated Date of Delivery: 02/04/18 being seen today for ongoing management of a low-risk pregnancy.  Today she reports continued contractions, no worse/no better than Monday. [facility], cx still 4cm. Has had some mucousy d/c this am, no odor, itching, irritation. Denies UTI s/s. Denies fever/chills. Only low back pain she has been having throughout pregnancy. Denies ha, visual changes, ruq/epigastric pain, n/v.    Contractions: Regular. Vag. Bleeding: None.  Movement: Present. denies leaking of fluid. Review of Systems:   Pertinent items are noted in HPI Denies abnormal vaginal discharge w/ itching/odor/irritation, headaches, visual changes, shortness of breath, chest pain, abdominal pain, severe nausea/vomiting, or problems with urination or bowel movements unless otherwise stated above. Pertinent History Reviewed:  Reviewed past medical,surgical, social, obstetrical and family history.  Reviewed problem list, medications and allergies. Physical Assessment:   Vitals:   01/20/18 0905  BP: 120/72  Pulse: (!) 108  Weight: 206 lb 8 oz (93.7 kg)  Body mass index is 34.36 kg/m.        Physical Examination:   General appearance: Well appearing, and in no distress  Mental status: Alert, oriented to person, place, and time  Skin: Warm & dry  Cardiovascular: Normal heart rate noted  Respiratory: Normal respiratory effort, no distress  Abdomen: Soft, gravid, nontender  Pelvic: Cervical exam performed  Dilation: 4 Effacement (%): 50 Station: -2  Extremities: Edema: Trace  Fetal Status: Fetal Heart Rate (bpm): 150 Fundal Height: 37 cm Movement: Present Presentation: Vertex  Results for orders placed or performed in visit on 01/20/18  (from the past 24 hour(s))  POCT urinalysis dipstick   Collection Time: 01/20/18  9:05 AM  Result Value Ref Range   Color, UA     Clarity, UA     Glucose, UA neg    Bilirubin, UA     Ketones, UA 1+    Spec Grav, UA  1.010 - 1.025   Blood, UA 3+    pH, UA  5.0 - 8.0   Protein, UA 2+    Urobilinogen, UA  0.2 or 1.0 E.U./dL   Nitrite, UA neg    Leukocytes, UA Trace (A) Negative   Appearance     Odor      Assessment & Plan:  1) Low-risk pregnancy [id number] at 6033w6d with an Estimated Date of Delivery: 02/04/18   2) Prodromal labor, discussed can go on for days+, [facility] feels is definitely in active labor/srom/vb, etc  3) Proteinuria, hematuria> denies UTI s/s, has h/o kidney stone but is not having any pain currently, will send urine cx. BP normal, asymptomatic, no h/o pre-e. Discussed pre-e s/s   Meds: No orders of the defined types were placed in this encounter.  Labs/procedures today: sve  Plan:  Continue routine obstetrical care   Reviewed: Term labor symptoms and general obstetric precautions including but not limited to vaginal bleeding, contractions, leaking of fluid and fetal movement were reviewed in detail with the patient.  All questions were answered  [facility]  Orders Placed This Encounter  Procedures  . Urine Culture  . POCT urinalysis dipstick   Cheral MarkerKimberly R Jayzon Taras CNM, [facility] 01/20/2018 9:27 AM

## 2018-01-21 ENCOUNTER — Telehealth: Payer: Self-pay | Admitting: Women's Health

## 2018-01-21 ENCOUNTER — Encounter: Payer: Medicaid Other | Admitting: Women's Health

## 2018-01-21 MED ORDER — CEPHALEXIN 500 MG PO CAPS: 500.0000 mg | ORAL_CAPSULE | Freq: Four times a day (QID) | ORAL | 0 refills | Status: DC

## 2018-01-21 NOTE — Telephone Encounter (Signed)
Patient called stating that came to her appointment yesterday and saw kim, pt states that Selena BattenKim asked her if she was having pain on her back on the left side. Pt states that at the time she didn't but this morning she is having the pain on her back on the left side. Pt states that kim told her it could be a kidney infection and she would call in an antibiotic. Please contact pt

## 2018-01-21 NOTE — Telephone Encounter (Signed)
Pt aware to pick up prescription. JSY

## 2018-01-22 LAB — URINE CULTURE

## 2018-01-25 ENCOUNTER — Ambulatory Visit (INDEPENDENT_AMBULATORY_CARE_PROVIDER_SITE_OTHER): Payer: Medicaid Other | Admitting: Women's Health

## 2018-01-25 ENCOUNTER — Encounter: Payer: Self-pay | Admitting: Women's Health

## 2018-01-25 VITALS — BP 100/60 | HR 97 | Wt 208.0 lb

## 2018-01-25 DIAGNOSIS — O23593 Infection of other part of genital tract in pregnancy, third trimester: Secondary | ICD-10-CM

## 2018-01-25 DIAGNOSIS — Z3A38 38 weeks gestation of pregnancy: Secondary | ICD-10-CM

## 2018-01-25 DIAGNOSIS — Z1389 Encounter for screening for other disorder: Secondary | ICD-10-CM

## 2018-01-25 DIAGNOSIS — L292 Pruritus vulvae: Secondary | ICD-10-CM | POA: Diagnosis not present

## 2018-01-25 DIAGNOSIS — Z331 Pregnant state, incidental: Secondary | ICD-10-CM

## 2018-01-25 DIAGNOSIS — Z3483 Encounter for supervision of other normal pregnancy, third trimester: Secondary | ICD-10-CM

## 2018-01-25 LAB — POCT WET PREP (WET MOUNT)
Clue Cells Wet Prep Whiff POC: NEGATIVE
Trichomonas Wet Prep HPF POC: ABSENT

## 2018-01-25 LAB — POCT URINALYSIS DIPSTICK
Blood, UA: 1
Glucose, UA: NEGATIVE
Ketones, UA: NEGATIVE
Leukocytes, UA: NEGATIVE
Nitrite, UA: NEGATIVE

## 2018-01-25 NOTE — Patient Instructions (Signed)
Jessica Ward, I greatly value your feedback.  If you receive a survey following your visit with us today, we appreciate you taking the time to fill it out.  Thanks, Jessica Ward, CNM, WHNP-BC   Call the office [phone]) or go to [facility] if:  You begin to have strong, frequent contractions  Your water breaks.  Sometimes it is a big gush of fluid, sometimes it is just a trickle that keeps getting your panties wet or running down your legs  You have vaginal bleeding.  It is normal to have a small amount of spotting if your cervix was checked.   You don't feel your baby moving like normal.  If you don't, get you something to eat and drink and lay down and focus on feeling your baby move.  You should feel at least 10 movements in 2 hours.  If you don't, you should call the office or go to [facility].     [facility] Contractions of the uterus can occur throughout pregnancy, but they are not always a sign that you are in labor. You may have practice contractions called Braxton Hicks contractions. These false labor contractions are sometimes confused with true labor. What are Jessica PeltonBraxton Hicks contractions? Braxton Hicks contractions are tightening movements that occur in the muscles of the uterus before labor. Unlike true labor contractions, these contractions do not result in opening (dilation) and thinning of the cervix. Toward the end of pregnancy (32-34 weeks), Braxton Hicks contractions can happen more often and may become stronger. These contractions are sometimes difficult to tell apart from true labor because they can be very uncomfortable. You should not feel embarrassed if you go to the hospital with false labor. Sometimes, the only way to tell if you are in true labor is for your health care provider to look for changes in the cervix. The health care provider will do a physical exam and may monitor your contractions. If you are not in true labor, the exam should show  that your cervix is not dilating and your water has not broken. If there are other health problems associated with your pregnancy, it is completely safe for you to be sent home with false labor. You may continue to have Braxton Hicks contractions until you go into true labor. How to tell the difference between true labor and false labor True labor  Contractions last 30-70 seconds.  Contractions become very regular.  Discomfort is usually felt in the top of the uterus, and it spreads to the lower abdomen and low back.  Contractions do not go away with walking.  Contractions usually become more intense and increase in frequency.  The cervix dilates and gets thinner. False labor  Contractions are usually shorter and not as strong as true labor contractions.  Contractions are usually irregular.  Contractions are often felt in the front of the lower abdomen and in the groin.  Contractions may go away when you walk around or change positions while lying down.  Contractions get weaker and are shorter-lasting as time goes on.  The cervix usually does not dilate or become thin. Follow these instructions at home:  Take over-the-counter and prescription medicines only as told by your health care provider.  Keep up with your usual exercises and follow other instructions from your health care provider.  Eat and drink lightly if you think you are going into labor.  If Braxton Hicks contractions are making you uncomfortable: ? Change your position from lying down  or resting to walking, or change from walking to resting. ? Sit and rest in a tub of warm water. ? Drink enough fluid to keep your urine pale yellow. Dehydration may cause these contractions. ? Do slow and deep breathing several times an hour.  Keep all follow-up prenatal visits as told by your health care provider. This is important. Contact a health care provider if:  You have a fever.  You have continuous pain in your  abdomen. Get help right away if:  Your contractions become stronger, more regular, and closer together.  You have fluid leaking or gushing from your vagina.  You pass blood-tinged mucus (bloody show).  You have bleeding from your vagina.  You have low back pain that you never had before.  You feel your baby's head pushing down and causing pelvic pressure.  Your baby is not moving inside you as much as it used to. Summary  Contractions that occur before labor are called Braxton Hicks contractions, false labor, or practice contractions.  Braxton Hicks contractions are usually shorter, weaker, farther apart, and less regular than true labor contractions. True labor contractions usually become progressively stronger and regular and they become more frequent.  Manage discomfort from [facility] contractions by changing position, resting in a warm bath, drinking plenty of water, or practicing deep breathing. This information is not intended to replace advice given to you by your health care provider. Make sure you discuss any questions you have with your health care provider. Document Released: 02/19/2017 Document Revised: 02/19/2017 Document Reviewed: 02/19/2017 Elsevier Interactive Patient Education  2018 [employer].

## 2018-01-25 NOTE — Progress Notes (Signed)
LOW-RISK PREGNANCY VISIT Patient name: Jessica Ward MRN [id number]  Date of birth: June 08, 1991 Chief Complaint:   Routine Prenatal Visit  History of Present Illness:   Mirabel Ahlgren is a 27 y.o. [id number] female at [redacted]w[redacted]d with an Estimated Date of Delivery: 02/04/18 being seen today for ongoing management of a low-risk pregnancy.  Today she reports uc's this am, thinks she has a yeast infection- vulvovagina itching x 1 day, no abnormal d/c or odor. Contractions: Irregular.  .  Movement: Present. denies leaking of fluid. Review of Systems:   Pertinent items are noted in HPI Denies abnormal vaginal discharge w/ itching/odor/irritation, headaches, visual changes, shortness of breath, chest pain, abdominal pain, severe nausea/vomiting, or problems with urination or bowel movements unless otherwise stated above. Pertinent History Reviewed:  Reviewed past medical,surgical, social, obstetrical and family history.  Reviewed problem list, medications and allergies. Physical Assessment:   Vitals:   01/25/18 0937  BP: 100/60  Pulse: 97  Weight: 208 lb (94.3 kg)  Body mass index is 34.61 kg/m.        Physical Examination:   General appearance: Well appearing, and in no distress  Mental status: Alert, oriented to person, place, and time  Skin: Warm & dry  Cardiovascular: Normal heart rate noted  Respiratory: Normal respiratory effort, no distress  Abdomen: Soft, gravid, nontender  Pelvic: Cervical exam performed  Dilation: 4 Effacement (%): 50 Station: -2  Extremities: Edema: Trace  Fetal Status: Fetal Heart Rate (bpm): 155 Fundal Height: 38 cm Movement: Present Presentation: Vertex  Results for orders placed or performed in visit on 01/25/18 (from the past 24 hour(s))  POCT urinalysis dipstick   Collection Time: 01/25/18  9:42 AM  Result Value Ref Range   Color, UA     Clarity, UA     Glucose, UA neg    Bilirubin, UA     Ketones, UA neg    Spec Grav, UA  1.010 - 1.025   Blood, UA 1      pH, UA  5.0 - 8.0   Protein, UA trace    Urobilinogen, UA  0.2 or 1.0 E.U./dL   Nitrite, UA neg    Leukocytes, UA Negative Negative   Appearance     Odor    POCT Wet Prep [facility] [place])   Collection Time: 01/25/18 10:04 AM  Result Value Ref Range   Source Wet Prep POC vaginal    WBC, Wet Prep HPF POC few    Bacteria Wet Prep HPF POC Few Few   BACTERIA WET PREP MORPHOLOGY POC     Clue Cells Wet Prep HPF POC None None   Clue Cells Wet Prep Whiff POC Negative Whiff    Yeast Wet Prep HPF POC Few    KOH Wet Prep POC     Trichomonas Wet Prep HPF POC Absent Absent    Assessment & Plan:  1) Low-risk pregnancy [id number] at [redacted]w[redacted]d with an Estimated Date of Delivery: 02/04/18   2) Mild vulvovaginal candida, use otc 7 night yeast cream, no sex while using   Meds: No orders of the defined types were placed in this encounter.  Labs/procedures today: sve  Plan:  Continue routine obstetrical care   Reviewed: Term labor symptoms and general obstetric precautions including but not limited to vaginal bleeding, contractions, leaking of fluid and fetal movement were reviewed in detail with the patient.  All questions were answered  Follow-up: Return for Thurs of Fri for LROB. for membrane sweeping per pt request  Orders Placed This Encounter  Procedures  . POCT urinalysis dipstick  . POCT Wet Prep [facility] [place])   Cheral MarkerKimberly R Malyk Girouard CNM, [facility] 01/25/2018 10:05 AM

## 2018-01-28 ENCOUNTER — Other Ambulatory Visit: Payer: Self-pay

## 2018-01-28 ENCOUNTER — Ambulatory Visit (INDEPENDENT_AMBULATORY_CARE_PROVIDER_SITE_OTHER): Payer: Medicaid Other | Admitting: Advanced Practice Midwife

## 2018-01-28 ENCOUNTER — Encounter: Payer: Self-pay | Admitting: Advanced Practice Midwife

## 2018-01-28 VITALS — BP 102/64 | HR 92 | Wt 209.0 lb

## 2018-01-28 DIAGNOSIS — Z3A39 39 weeks gestation of pregnancy: Secondary | ICD-10-CM

## 2018-01-28 DIAGNOSIS — Z331 Pregnant state, incidental: Secondary | ICD-10-CM

## 2018-01-28 DIAGNOSIS — Z3483 Encounter for supervision of other normal pregnancy, third trimester: Secondary | ICD-10-CM | POA: Diagnosis not present

## 2018-01-28 DIAGNOSIS — Z1389 Encounter for screening for other disorder: Secondary | ICD-10-CM

## 2018-01-28 LAB — POCT URINALYSIS DIPSTICK
Blood, UA: NEGATIVE
GLUCOSE UA: NEGATIVE
Leukocytes, UA: NEGATIVE
NITRITE UA: NEGATIVE
Protein, UA: NEGATIVE

## 2018-01-28 NOTE — Progress Notes (Signed)
  [id number] 8027w0d Estimated Date of Delivery: 02/04/18  Blood pressure 102/64, pulse 92, weight 209 lb (94.8 kg), last menstrual period 04/30/2017.   BP weight and urine results all reviewed and noted.  Please refer to the obstetrical flow sheet for the fundal height and fetal heart rate documentation:  Patient reports good fetal movement, denies any bleeding and no rupture of membranes symptoms or regular contractions. Patient is without complaints. All questions were answered.   Physical Assessment:   Vitals:   01/28/18 1507  BP: 102/64  Pulse: 92  Weight: 209 lb (94.8 kg)  Body mass index is 34.78 kg/m.        Physical Examination:   General appearance: Well appearing, and in no distress  Mental status: Alert, oriented to person, place, and time  Skin: Warm & dry  Cardiovascular: Normal heart rate noted  Respiratory: Normal respiratory effort, no distress  Abdomen: Soft, gravid, nontender  Pelvic: Cervical exam performed  Dilation: 4.5 Effacement (%): 50 Station: -2  Extremities: Edema: Trace  Fetal Status: Fetal Heart Rate (bpm): 148 Fundal Height: 39 cm Movement: Present Presentation: Vertex  Results for orders placed or performed in visit on 01/28/18 (from the past 24 hour(s))  POCT urinalysis dipstick   Collection Time: 01/28/18  3:09 PM  Result Value Ref Range   Color, UA     Clarity, UA     Glucose, UA neg    Bilirubin, UA     Ketones, UA small    Spec Grav, UA  1.010 - 1.025   Blood, UA neg    pH, UA  5.0 - 8.0   Protein, UA neg    Urobilinogen, UA  0.2 or 1.0 E.U./dL   Nitrite, UA neg    Leukocytes, UA Negative Negative   Appearance     Odor       Orders Placed This Encounter  Procedures  . POCT urinalysis dipstick    Plan:  Continued routine obstetrical care, MEMBRANES STRIPPED  Return in about 1 week (around 02/04/2018) for LROB.

## 2018-01-30 ENCOUNTER — Inpatient Hospital Stay (HOSPITAL_COMMUNITY)
Admission: AD | Admit: 2018-01-30 | Discharge: 2018-02-01 | DRG: 807 | Disposition: A | Discharge status: Home / Self Care | Payer: Medicaid Other | Attending: Obstetrics & Gynecology | Admitting: Obstetrics & Gynecology

## 2018-01-30 ENCOUNTER — Other Ambulatory Visit: Payer: Self-pay

## 2018-01-30 ENCOUNTER — Encounter (HOSPITAL_COMMUNITY): Payer: Self-pay

## 2018-01-30 ENCOUNTER — Inpatient Hospital Stay (HOSPITAL_COMMUNITY): Payer: Medicaid Other | Admitting: Anesthesiology

## 2018-01-30 DIAGNOSIS — Z3A39 39 weeks gestation of pregnancy: Secondary | ICD-10-CM

## 2018-01-30 DIAGNOSIS — O09899 Supervision of other high risk pregnancies, unspecified trimester: Secondary | ICD-10-CM

## 2018-01-30 DIAGNOSIS — O4292 Full-term premature rupture of membranes, unspecified as to length of time between rupture and onset of labor: Secondary | ICD-10-CM | POA: Diagnosis present

## 2018-01-30 DIAGNOSIS — Z3483 Encounter for supervision of other normal pregnancy, third trimester: Secondary | ICD-10-CM

## 2018-01-30 DIAGNOSIS — J111 Influenza due to unidentified influenza virus with other respiratory manifestations: Secondary | ICD-10-CM

## 2018-01-30 DIAGNOSIS — O9989 Other specified diseases and conditions complicating pregnancy, childbirth and the puerperium: Secondary | ICD-10-CM

## 2018-01-30 DIAGNOSIS — O9962 Diseases of the digestive system complicating childbirth: Principal | ICD-10-CM | POA: Diagnosis present

## 2018-01-30 DIAGNOSIS — K219 Gastro-esophageal reflux disease without esophagitis: Secondary | ICD-10-CM | POA: Diagnosis present

## 2018-01-30 DIAGNOSIS — Z3490 Encounter for supervision of normal pregnancy, unspecified, unspecified trimester: Secondary | ICD-10-CM

## 2018-01-30 DIAGNOSIS — Z283 Underimmunization status: Secondary | ICD-10-CM

## 2018-01-30 LAB — POCT FERN TEST: POCT Fern Test: POSITIVE

## 2018-01-30 LAB — CBC
HCT: 32.5 % — ABNORMAL LOW (ref 36.0–46.0)
Hemoglobin: 11 g/dL — ABNORMAL LOW (ref 12.0–15.0)
MCH: 28.9 pg (ref 26.0–34.0)
MCHC: 33.8 g/dL (ref 30.0–36.0)
MCV: 85.3 fL (ref 78.0–100.0)
PLATELETS: 198 10*3/uL (ref 150–400)
RBC: 3.81 MIL/uL — ABNORMAL LOW (ref 3.87–5.11)
RDW: 14 % (ref 11.5–15.5)
WBC: 8.7 10*3/uL (ref 4.0–10.5)

## 2018-01-30 LAB — TYPE AND SCREEN
ABO/RH(D): A POS
Antibody Screen: NEGATIVE

## 2018-01-30 MED ORDER — PENICILLIN G POT IN DEXTROSE 60000 UNIT/ML IV SOLN
3.0000 10*6.[IU] | INTRAVENOUS | Status: DC
  Filled 2018-01-30 (×2): qty 50

## 2018-01-30 MED ORDER — DIBUCAINE 1 % RE OINT: 1.0000 "application " | TOPICAL_OINTMENT | RECTAL | Status: DC | PRN

## 2018-01-30 MED ORDER — LIDOCAINE HCL (PF) 1 % IJ SOLN
INTRAMUSCULAR | Status: DC | PRN
  Administered 2018-01-30: 4 mL

## 2018-01-30 MED ORDER — SIMETHICONE 80 MG PO CHEW: 80.0000 mg | CHEWABLE_TABLET | ORAL | Status: DC | PRN

## 2018-01-30 MED ORDER — DIPHENHYDRAMINE HCL 25 MG PO CAPS: 25.0000 mg | ORAL_CAPSULE | Freq: Four times a day (QID) | ORAL | Status: DC | PRN

## 2018-01-30 MED ORDER — PHENYLEPHRINE 40 MCG/ML (10ML) SYRINGE FOR IV PUSH (FOR BLOOD PRESSURE SUPPORT)
80.0000 ug | PREFILLED_SYRINGE | INTRAVENOUS | Status: DC | PRN
  Filled 2018-01-30: qty 5
  Filled 2018-01-30 (×2): qty 10

## 2018-01-30 MED ORDER — EPHEDRINE 5 MG/ML INJ
10.0000 mg | INTRAVENOUS | Status: DC | PRN
  Filled 2018-01-30: qty 2

## 2018-01-30 MED ORDER — PHENYLEPHRINE 40 MCG/ML (10ML) SYRINGE FOR IV PUSH (FOR BLOOD PRESSURE SUPPORT)
80.0000 ug | PREFILLED_SYRINGE | INTRAVENOUS | Status: DC | PRN
  Administered 2018-01-30: 80 ug via INTRAVENOUS
  Filled 2018-01-30: qty 5

## 2018-01-30 MED ORDER — ACETAMINOPHEN 325 MG PO TABS
650.0000 mg | ORAL_TABLET | ORAL | Status: DC | PRN
  Administered 2018-01-31 (×2): 650 mg via ORAL
  Filled 2018-01-30 (×2): qty 2

## 2018-01-30 MED ORDER — IBUPROFEN 600 MG PO TABS
600.0000 mg | ORAL_TABLET | Freq: Four times a day (QID) | ORAL | Status: DC
  Administered 2018-01-30 – 2018-02-01 (×6): 600 mg via ORAL
  Filled 2018-01-30 (×6): qty 1

## 2018-01-30 MED ORDER — ZOLPIDEM TARTRATE 5 MG PO TABS
5.0000 mg | ORAL_TABLET | Freq: Every evening | ORAL | Status: DC | PRN
Start: 2018-01-30 — End: 2018-02-01

## 2018-01-30 MED ORDER — LACTATED RINGERS IV SOLN: 500.0000 mL | Freq: Once | INTRAVENOUS | Status: DC

## 2018-01-30 MED ORDER — ACETAMINOPHEN 325 MG PO TABS: 650.0000 mg | ORAL_TABLET | ORAL | Status: DC | PRN

## 2018-01-30 MED ORDER — FENTANYL 2.5 MCG/ML BUPIVACAINE 1/10 % EPIDURAL INFUSION (WH - ANES)
14.0000 mL/h | INTRAMUSCULAR | Status: DC | PRN
  Administered 2018-01-30: 14 mL/h via EPIDURAL
  Filled 2018-01-30: qty 100

## 2018-01-30 MED ORDER — PHENYLEPHRINE 40 MCG/ML (10ML) SYRINGE FOR IV PUSH (FOR BLOOD PRESSURE SUPPORT)
80.0000 ug | PREFILLED_SYRINGE | INTRAVENOUS | Status: DC | PRN
Start: 2018-01-30 — End: 2018-01-30
  Filled 2018-01-30: qty 5

## 2018-01-30 MED ORDER — SODIUM CHLORIDE 0.9 % IV SOLN
5.0000 10*6.[IU] | Freq: Once | INTRAVENOUS | Status: AC
  Administered 2018-01-30: 5 10*6.[IU] via INTRAVENOUS
  Filled 2018-01-30: qty 5

## 2018-01-30 MED ORDER — LACTATED RINGERS IV SOLN
INTRAVENOUS | Status: DC
  Administered 2018-01-30: 17:00:00 via INTRAVENOUS

## 2018-01-30 MED ORDER — COCONUT OIL OIL
1.0000 "application " | TOPICAL_OIL | Status: DC | PRN
  Filled 2018-01-30: qty 120

## 2018-01-30 MED ORDER — TERBUTALINE SULFATE 1 MG/ML IJ SOLN
0.2500 mg | Freq: Once | INTRAMUSCULAR | Status: DC | PRN
  Filled 2018-01-30: qty 1

## 2018-01-30 MED ORDER — PRENATAL MULTIVITAMIN CH
1.0000 | ORAL_TABLET | Freq: Every day | ORAL | Status: DC
  Administered 2018-02-01: 1 via ORAL
  Filled 2018-01-30: qty 1

## 2018-01-30 MED ORDER — BENZOCAINE-MENTHOL 20-0.5 % EX AERO
1.0000 "application " | INHALATION_SPRAY | CUTANEOUS | Status: DC | PRN
  Filled 2018-01-30: qty 56

## 2018-01-30 MED ORDER — ONDANSETRON HCL 4 MG/2ML IJ SOLN: 4.0000 mg | INTRAMUSCULAR | Status: DC | PRN

## 2018-01-30 MED ORDER — TETANUS-DIPHTH-ACELL PERTUSSIS 5-2.5-18.5 LF-MCG/0.5 IM SUSP
0.5000 mL | Freq: Once | INTRAMUSCULAR | Status: AC
  Administered 2018-02-01: 0.5 mL via INTRAMUSCULAR
  Filled 2018-01-30: qty 0.5

## 2018-01-30 MED ORDER — OXYTOCIN 40 UNITS IN LACTATED RINGERS INFUSION - SIMPLE MED
1.0000 m[IU]/min | INTRAVENOUS | Status: DC
  Administered 2018-01-30: 1 m[IU]/min via INTRAVENOUS
  Filled 2018-01-30: qty 1000

## 2018-01-30 MED ORDER — OXYTOCIN 40 UNITS IN LACTATED RINGERS INFUSION - SIMPLE MED: 2.5000 [IU]/h | INTRAVENOUS | Status: DC

## 2018-01-30 MED ORDER — ONDANSETRON HCL 4 MG PO TABS
4.0000 mg | ORAL_TABLET | ORAL | Status: DC | PRN
Start: 2018-01-30 — End: 2018-02-01

## 2018-01-30 MED ORDER — MISOPROSTOL 200 MCG PO TABS
ORAL_TABLET | ORAL | Status: AC
  Filled 2018-01-30: qty 4

## 2018-01-30 MED ORDER — FLEET ENEMA 7-19 GM/118ML RE ENEM: 1.0000 | ENEMA | RECTAL | Status: DC | PRN

## 2018-01-30 MED ORDER — OXYTOCIN BOLUS FROM INFUSION
500.0000 mL | Freq: Once | INTRAVENOUS | Status: AC
  Administered 2018-01-30: 500 mL via INTRAVENOUS

## 2018-01-30 MED ORDER — LIDOCAINE HCL (PF) 1 % IJ SOLN
30.0000 mL | INTRAMUSCULAR | Status: DC | PRN
  Filled 2018-01-30: qty 30

## 2018-01-30 MED ORDER — SENNOSIDES-DOCUSATE SODIUM 8.6-50 MG PO TABS
2.0000 | ORAL_TABLET | ORAL | Status: DC
  Administered 2018-01-30: 2 via ORAL
  Filled 2018-01-30: qty 2

## 2018-01-30 MED ORDER — ONDANSETRON HCL 4 MG/2ML IJ SOLN: 4.0000 mg | Freq: Four times a day (QID) | INTRAMUSCULAR | Status: DC | PRN

## 2018-01-30 MED ORDER — DIPHENHYDRAMINE HCL 50 MG/ML IJ SOLN: 12.5000 mg | INTRAMUSCULAR | Status: DC | PRN

## 2018-01-30 MED ORDER — OXYCODONE-ACETAMINOPHEN 5-325 MG PO TABS
1.0000 | ORAL_TABLET | ORAL | Status: DC | PRN
Start: 2018-01-30 — End: 2018-01-30

## 2018-01-30 MED ORDER — WITCH HAZEL-GLYCERIN EX PADS: 1.0000 "application " | MEDICATED_PAD | CUTANEOUS | Status: DC | PRN

## 2018-01-30 MED ORDER — OXYCODONE-ACETAMINOPHEN 5-325 MG PO TABS
2.0000 | ORAL_TABLET | ORAL | Status: DC | PRN
Start: 2018-01-30 — End: 2018-01-30

## 2018-01-30 MED ORDER — LACTATED RINGERS IV SOLN: 500.0000 mL | INTRAVENOUS | Status: DC | PRN

## 2018-01-30 MED ORDER — EPHEDRINE 5 MG/ML INJ
10.0000 mg | INTRAVENOUS | Status: DC | PRN
Start: 2018-01-30 — End: 2018-01-30
  Filled 2018-01-30: qty 2

## 2018-01-30 MED ORDER — SOD CITRATE-CITRIC ACID 500-334 MG/5ML PO SOLN: 30.0000 mL | ORAL | Status: DC | PRN

## 2018-01-30 NOTE — MAU Note (Signed)
Started leaking around 1330, still coming. Sharp pains in  Lower abd, has been contracting for a while, ~every 5 min.Marland Kitchen. Has been having mucous d/c for 2 days, blood in it.  Last checked on Thurs, was 4cm, membranes stripped.

## 2018-01-30 NOTE — MAU Note (Signed)
Pt states water broke around 1330. Denies bleeding. Has lost mucous plug. + FM.  Irregular BH contractions.

## 2018-01-30 NOTE — Anesthesia Preprocedure Evaluation (Addendum)
Anesthesia Evaluation  Patient identified by MRN, date of birth, ID band Patient awake    Reviewed: Allergy & Precautions, Patient's Chart, lab work & pertinent test results  Airway Mallampati: II       Dental no notable dental hx.    Pulmonary neg pulmonary ROS,    Pulmonary exam normal        Cardiovascular Normal cardiovascular exam     Neuro/Psych negative neurological ROS  negative psych ROS   GI/Hepatic Neg liver ROS,   Endo/Other    Renal/GU negative Renal ROS     Musculoskeletal   Abdominal   Peds  Hematology negative hematology ROS (+)   Anesthesia Other Findings   Reproductive/Obstetrics (+) Pregnancy                                                               Anesthesia Evaluation  Patient identified by MRN, date of birth, ID band Patient awake    Reviewed: Allergy & Precautions, NPO status , Patient's Chart, lab work & pertinent test results  Airway Mallampati: II  TM Distance: >3 FB Neck ROM: Full    Dental  (+) Teeth Intact   Pulmonary neg pulmonary ROS,    breath sounds clear to auscultation       Cardiovascular negative cardio ROS   Rhythm:Regular Rate:Normal     Neuro/Psych negative neurological ROS  negative psych ROS   GI/Hepatic Neg liver ROS, GERD  Medicated,  Endo/Other  negative endocrine ROS  Renal/GU negative Renal ROS  negative genitourinary   Musculoskeletal negative musculoskeletal ROS (+)   Abdominal   Peds negative pediatric ROS (+)  Hematology negative hematology ROS (+)   Anesthesia Other Findings   Reproductive/Obstetrics (+) Pregnancy                             Lab Results  Component Value Date   WBC 8.7 01/30/2018   HGB 11.0 (L) 01/30/2018   HCT 32.5 (L) 01/30/2018   MCV 85.3 01/30/2018   PLT 198 01/30/2018   No results found for: INR, PROTIME   Anesthesia  Physical Anesthesia Plan  ASA: II  Anesthesia Plan: Epidural   Post-op Pain Management:    Induction:   Airway Management Planned:   Additional Equipment:   Intra-op Plan:   Post-operative Plan:   Informed Consent: I have reviewed the patients History and Physical, chart, labs and discussed the procedure including the risks, benefits and alternatives for the proposed anesthesia with the patient or authorized representative who has indicated his/her understanding and acceptance.     Plan Discussed with:   Anesthesia Plan Comments:         Anesthesia Quick Evaluation  Anesthesia Physical Anesthesia Plan  ASA: II  Anesthesia Plan: Epidural   Post-op Pain Management:    Induction:   PONV Risk Score and Plan: 2 and Treatment may vary due to age or medical condition  Airway Management Planned:   Additional Equipment:   Intra-op Plan:   Post-operative Plan:   Informed Consent: I have reviewed the patients History and Physical, chart, labs and discussed the procedure including the risks, benefits and alternatives for the proposed anesthesia with the patient or authorized representative who has indicated his/her understanding  and acceptance.     Plan Discussed with:   Anesthesia Plan Comments: (Lab Results      Component                Value               Date                      WBC                      8.7                 01/30/2018                HGB                      11.0 (L)            01/30/2018                HCT                      32.5 (L)            01/30/2018                MCV                      85.3                01/30/2018                PLT                      198                 01/30/2018           )       Anesthesia Quick Evaluation

## 2018-01-30 NOTE — H&P (Addendum)
OBSTETRIC ADMISSION HISTORY AND PHYSICAL  Jessica Ward is a 27 y.o. female [phone] with IUP at [redacted]w[redacted]d by LMP presenting for labor. She reports +FMs, No LOF, no VB, no blurry vision, headaches or peripheral edema, and RUQ pain.  She plans on breast feeding. She has signed a consent for an in hospital BTL for birth control. She received her prenatal care at [facility]   Dating: By LMP --->  Estimated Date of Delivery: 02/04/18  Sono:    @[redacted]w[redacted]d , CWD, normal anatomy,  Cephalic presentation  [facility]  Initiated Care at  8wk  FOB Vevelyn Pat  Dating By LMP c/s U/S 6wk  Pap 01/31/15 negative  GC/CT Initial:             -/-   36+wks:  -/-  Genetic Screen NT/IT: neg  CF screen declined  Anatomic [place] Normal female, Jessica Ward  Flu vaccine 10/10 declined  Tdap Recommended ~ 28wks  Glucose Screen  2 hr74/63/65  GBS Pos  Feed Preference breast  Contraception In-hosp BTL, consent 11/03/17  Circumcision Yes at [facility]  Childbirth Classes declined  Pediatrician [place]   Prenatal History/Complications:  Past Medical History: Past Medical History:  Diagnosis Date  . High cholesterol   . Miscarriage   . Normal delivery 10/27/2012    Past Surgical History: Past Surgical History:  Procedure Laterality Date  . NO PAST SURGERIES      Obstetrical History: OB History    Gravida  5   Para  3   Term  3   Preterm      AB  1   Living  3     SAB  1   TAB      Ectopic      Multiple  0   Live Births  3           Social History: Social History   Socioeconomic History  . Marital status: Married    Spouse name: Not on file  . Number of children: Not on file  . Years of education: Not on file  . Highest education level: Not on file  Occupational History  . Occupation: Futures trader  Social Needs  . Financial resource strain: Not on file  . Food insecurity:    Worry: Not on file    Inability: Not on file  . Transportation needs:    Medical: Not on file   Non-medical: Not on file  Tobacco Use  . Smoking status: Never Smoker  . Smokeless tobacco: Never Used  Substance and Sexual Activity  . Alcohol use: No  . Drug use: No  . Sexual activity: Not Currently    Birth control/protection: None  Lifestyle  . Physical activity:    Days per week: Not on file    Minutes per session: Not on file  . Stress: Not on file  Relationships  . Social connections:    Talks on phone: Not on file    Gets together: Not on file    Attends religious service: Not on file    Active member of club or organization: Not on file    Attends meetings of clubs or organizations: Not on file    Relationship status: Not on file  Other Topics Concern  . Not on file  Social History Narrative   Patient lives with her husband, 2 daughters, and 1 step-daughter. She is a stay-at-home mom, husband works in Event organiser. Husband is a smoker.    Family  History: Family History  Problem Relation Age of Onset  . Cancer Other        leukemia, breast-maternal great grandma  . Hypertension Mother   . Diabetes Maternal Grandfather   . Coronary artery disease Maternal Grandfather   . Heart attack Maternal Grandfather   . Stroke Maternal Grandfather   . Hyperlipidemia Father   . Heart disease Father        MI 12/2014  . Heart attack Father   . Other Daughter        tubes in ears  . Other Daughter        tubes in ears  . Hypercholesterolemia Paternal Aunt     Allergies: No Known Allergies  Medications Prior to Admission  Medication Sig Dispense Refill Last Dose  . acetaminophen (TYLENOL) 325 MG tablet Take 650 mg by mouth every 6 (six) hours as needed for mild pain or headache.    01/29/2018 at Unknown time  . flintstones complete (FLINTSTONES) 60 MG chewable tablet Chew 1 tablet by mouth daily. (Patient taking differently: Chew 2 tablets by mouth daily. )   01/30/2018 at Unknown time  . omeprazole (PRILOSEC) 20 MG capsule Take 1 capsule  (20 mg total) by mouth daily. 1 tablet a day 30 capsule 6 01/29/2018 at Unknown time     Review of Systems   All systems reviewed and negative except as stated in HPI  Blood pressure 109/66, pulse (!) 107, temperature 98.1 F (36.7 C), temperature source Oral, resp. rate 16, height 5\' 5"  (1.651 m), weight 95.5 kg (210 lb 8 oz), last menstrual period 04/30/2017, SpO2 100 %. General appearance: alert and cooperative Lungs: clear to auscultation bilaterally Heart: regular rate and rhythm Abdomen: soft, non-tender; bowel sounds normal Extremities: Homans sign is negative, no sign of DVT Presentation: cephalic Fetal monitoringBaseline: 150 bpm, Variability: Good {> 6 bpm), Accelerations: Non-reactive but appropriate for gestational age and Decelerations: Absent Uterine activityNone Dilation: 4.5 Effacement (%): 60 Station: -2 Exam by:: Bari Mantis RNC   Prenatal labs: ABO, Rh:   Antibody: Negative (01/15 0859) Rubella: <0.90 (09/12 1239) RPR: Non Reactive (01/15 0859)  HBsAg: Negative (09/12 1239)  HIV: Non Reactive (01/15 0859)  GBS:   positive Glucose Screen  2 hr74/63/65  Genetic screening  normal Anatomy US normal  Prenatal Transfer Tool  Maternal Diabetes: No Genetic Screening: Normal Maternal Ultrasounds/Referrals: Normal Fetal Ultrasounds or other Referrals:  None Maternal Substance Abuse:  No Significant Maternal Medications:  None Significant Maternal Lab Results: None  Results for orders placed or performed during the hospital encounter of 01/30/18 (from the past 24 hour(s))  POCT fern test   Collection Time: 01/30/18  3:53 PM  Result Value Ref Range   POCT Fern Test Positive = ruptured amniotic membanes     Patient Active Problem List   Diagnosis Date Noted  . Flu 11/30/2017  . Supervision of normal pregnancy 07/01/2017  . Spontaneous abortion without complication 12/22/2016  . Rubella non-immune status, antepartum 02/05/2015    Assessment/Plan:  Jessica Ward is a 27 y.o. [id number] at [redacted]w[redacted]d here for onset of labor. Rupture of membranes happened around 1330. Irregular contractions. Dilation 4.5. Will augment as appropriate if fails to make meaningful change at next check.  #Labor:expectant management #Pain: Analgesia prn #FWB: Cat 1 #ID:  pcn #MOF: breast #MOC: BTL #Circ:  Yes, at outpatient clinic.  Myrene Buddy, MD  01/30/2018, 4:17 PM   I confirm that I have verified the information documented in the resident's  note and that I have also personally reperformed the physical exam and all medical decision making activities.   Thressa ShellerHeather Teryn Boerema 4:44 PM 01/30/18

## 2018-01-30 NOTE — Anesthesia Procedure Notes (Signed)
Epidural Patient location during procedure: OB Start time: 01/30/2018 7:48 PM End time: 01/30/2018 7:54 PM  Staffing Anesthesiologist: Shelton SilvasHollis, Carylon Tamburro D, MD Performed: anesthesiologist   Preanesthetic Checklist Completed: patient identified, site marked, surgical consent, pre-op evaluation, timeout performed, IV checked, risks and benefits discussed and monitors and equipment checked  Epidural Patient position: sitting Prep: ChloraPrep Patient monitoring: heart rate, continuous pulse ox and blood pressure Approach: midline Location: L3-L4 Injection technique: LOR saline  Needle:  Needle type: Tuohy  Needle gauge: 17 G Needle length: 9 cm Catheter type: closed end flexible Catheter size: 20 Guage Test dose: negative and 1.5% lidocaine  Assessment Events: blood not aspirated, injection not painful, no injection resistance and no paresthesia  Additional Notes LOR @ 5  Patient identified. Risks/Benefits/Options discussed with patient including but not limited to bleeding, infection, nerve damage, paralysis, failed block, incomplete pain control, headache, blood pressure changes, nausea, vomiting, reactions to medications, itching and postpartum back pain. Confirmed with bedside nurse the patient's most recent platelet count. Confirmed with patient that they are not currently taking any anticoagulation, have any bleeding history or any family history of bleeding disorders. Patient expressed understanding and wished to proceed. All questions were answered. Sterile technique was used throughout the entire procedure. Please see nursing notes for vital signs. Test dose was given through epidural catheter and negative prior to continuing to dose epidural or start infusion. Warning signs of high block given to the patient including shortness of breath, tingling/numbness in hands, complete motor block, or any concerning symptoms with instructions to call for help. Patient was given instructions on  fall risk and not to get out of bed. All questions and concerns addressed with instructions to call with any issues or inadequate analgesia.    Reason for block:procedure for pain

## 2018-01-30 NOTE — Progress Notes (Signed)
Charlsie Merlesaylor Chopra is a 27 y.o. [phone] at 7144w2d admitted for rupture of membranes  Subjective:   Objective: BP 115/70   Pulse (!) 102   Temp 98.1 F (36.7 C) (Oral)   Resp 16   Ht 5\' 5"  (1.651 m)   Wt 210 lb 8 oz (95.5 kg)   LMP 04/30/2017 (Exact Date)   SpO2 100%   BMI 35.03 kg/m  No intake/output data recorded. No intake/output data recorded.  FHT:  FHR: 140 bpm, variability: moderate,  accelerations:  Present,  decelerations:  Absent UC:   irregular SVE:   Dilation: 4.5 Effacement (%): 70 Station: -2 Exam by:: H.Hogan,CNM  Labs: Lab Results  Component Value Date   WBC 8.7 01/30/2018   HGB 11.0 (L) 01/30/2018   HCT 32.5 (L) 01/30/2018   MCV 85.3 01/30/2018   PLT 198 01/30/2018    Assessment / Plan: SROM, latent labor   Labor: Latent labor, will start pitocin  Preeclampsia:  NA Fetal Wellbeing:  Category I Pain Control:  Labor support without medications I/D:  n/a Anticipated MOD:  NSVD  Thressa ShellerHeather Hogan 01/30/2018, 6:39 PM

## 2018-01-31 ENCOUNTER — Encounter (HOSPITAL_COMMUNITY)
Admission: AD | Disposition: A | Discharge status: Home / Self Care | Payer: Self-pay | Source: Ambulatory Visit | Attending: Obstetrics & Gynecology

## 2018-01-31 ENCOUNTER — Encounter (HOSPITAL_COMMUNITY): Payer: Self-pay

## 2018-01-31 LAB — RPR: RPR: NONREACTIVE

## 2018-01-31 SURGERY — LIGATION, FALLOPIAN TUBE, POSTPARTUM
Anesthesia: Epidural | Laterality: Bilateral

## 2018-01-31 NOTE — Anesthesia Postprocedure Evaluation (Signed)
Anesthesia Post Note  Patient: Jessica Ward  Procedure(s) Performed: AN AD HOC LABOR EPIDURAL     Patient location during evaluation: Mother Baby Anesthesia Type: Epidural Level of consciousness: awake and alert and oriented Pain management: satisfactory to patient Vital Signs Assessment: post-procedure vital signs reviewed and stable Respiratory status: respiratory function stable Cardiovascular status: stable Postop Assessment: no headache, no backache, epidural receding, patient able to bend at knees, no signs of nausea or vomiting and adequate PO intake Anesthetic complications: no    Last Vitals:  Vitals:   01/30/18 2315 01/31/18 0825  BP: 118/69 107/67  Pulse: (!) 103 81  Resp: 18 14  Temp: 36.9 C 36.5 C  SpO2:      Last Pain:  Vitals:   01/31/18 0833  TempSrc:   PainSc: 4    Pain Goal:                 Vere Diantonio

## 2018-01-31 NOTE — Progress Notes (Signed)
NPO after mn for BTL at 0900

## 2018-01-31 NOTE — Lactation Note (Signed)
This note was copied from a baby's chart. Lactation Consultation Note  Patient Name: Jessica Ward ZOXWR'UToday's Date: 01/31/2018 Reason for consult: Initial assessment;Term  1718 hours old FT female who is being exclusively BF by his mother, she's a P4. Mom is experienced BF she was able to BF her last child for 18 months. Baby was asleep and swaddled in visitors arms when entering the room. Offered assistance with latch but mom politely declined saying that baby has already fed a couple of hours ago. Mom feels pretty confident about BF, she states that feedings at the breast are comfortable and both of her nipples are intact with no signs of trauma. She also hears swallows when baby is at the breast. Asked mom to call for latch assistance if needed.  Encouraged mom to feed baby STS 8-12 times/24 hours or sooner if feeding cues are present. Reviewed BF brochure, BF resources and feeding diary. Mom is aware of LC services and will call PRN.  Maternal Data Formula Feeding for Exclusion: No Has patient been taught Hand Expression?: Yes Does the patient have breastfeeding experience prior to this delivery?: Yes  Feeding Feeding Type: Breast Fed Length of feed: 15 min  Interventions Interventions: Breast feeding basics reviewed  Lactation Tools Discussed/Used WIC Program: No   Consult Status Consult Status: Follow-up Date: 02/01/18 Follow-up type: In-patient    Jessica Ward 01/31/2018, 2:41 PM

## 2018-01-31 NOTE — Progress Notes (Signed)
Post Partum Day 1 Subjective: no complaints and up ad lib  Objective: Blood pressure 118/69, pulse (!) 103, temperature 98.4 F (36.9 C), temperature source Oral, resp. rate 18, height 5\' 5"  (1.651 m), weight 210 lb 8 oz (95.5 kg), last menstrual period 04/30/2017, SpO2 100 %, unknown if currently breastfeeding.  Physical Exam:  General: alert and no distress Lochia: appropriate Uterine Fundus: firm Incision: N/A DVT Evaluation: No evidence of DVT seen on physical exam.  Recent Labs    01/30/18 1641  HGB 11.0*  HCT 32.5*    Assessment/Plan: [facility]feeding [facility]  Will have baby circumcised at [facility].   LOS: 1 day   Wendee BeaversDavid J Alcee Sipos 01/31/2018, 7:23 AM

## 2018-02-01 LAB — BIRTH TISSUE RECOVERY COLLECTION (PLACENTA DONATION)

## 2018-02-01 MED ORDER — IBUPROFEN 600 MG PO TABS: 600.0000 mg | ORAL_TABLET | Freq: Four times a day (QID) | ORAL | 0 refills | Status: DC

## 2018-02-01 MED ORDER — MEASLES, MUMPS & RUBELLA VAC ~~LOC~~ INJ
0.5000 mL | INJECTION | Freq: Once | SUBCUTANEOUS | Status: AC
  Administered 2018-02-01: 0.5 mL via SUBCUTANEOUS
  Filled 2018-02-01: qty 0.5

## 2018-02-01 NOTE — Discharge Summary (Signed)
OB Discharge Summary     Patient Name: Jessica Ward DOB: June 26, 1991 MRN: [id number]  Date of admission: 01/30/2018 Delivering MD: Pincus LargePHELPS, JAZMA Y   Date of discharge: 02/01/2018  Admitting diagnosis: 39 WKS, WATER BROKE Intrauterine pregnancy: 1328w2d     Secondary diagnosis:  Active Problems:   Rubella non-immune status, antepartum   Supervision of normal pregnancy   Normal labor   SVD (spontaneous vaginal delivery)      Discharge diagnosis: Term Pregnancy Delivered                                                                                                Post partum procedures:N/a  Augmentation: Pitocin  Complications: None  Hospital course:  Onset of Labor With Vaginal Delivery     27 y.o. yo [id number] at 6428w2d was admitted in Latent Labor, and ROM on 01/30/2018. Patient had an uncomplicated labor course as follows:  Membrane Rupture Time/Date: 1:30 PM ,01/30/2018   Intrapartum Procedures: Episiotomy: None [1]                                         Lacerations:  Vaginal [6]  Patient had a delivery of a Viable infant. 01/30/2018  Information for the patient's newborn:  Earlean PolkaVernon, Boy Iolani [[id number]][[id number]]  Delivery Method: Vaginal, Spontaneous(Filed from Delivery Summary)    Pateint had an uncomplicated postpartum course.  She is ambulating, tolerating a regular diet, passing flatus, and urinating well. Patient is discharged home in stable condition on 02/01/18.   Physical exam  Vitals:   01/30/18 2315 01/31/18 0825 01/31/18 1652 02/01/18 0304  BP: 118/69 107/67 112/75 117/67  Pulse: (!) 103 81 87 78  Resp: 18 14 18 18   Temp: 98.4 F (36.9 C) 97.7 F (36.5 C) 97.9 F (36.6 C) 97.8 F (36.6 C)  TempSrc: Oral Oral Oral Oral  SpO2:   100%   Weight:      Height:       General: alert and cooperative Lochia: appropriate Uterine Fundus: firm Incision: N/A DVT Evaluation: No evidence of DVT seen on physical exam. Labs: Lab Results  Component Value Date   WBC 8.7  01/30/2018   HGB 11.0 (L) 01/30/2018   HCT 32.5 (L) 01/30/2018   MCV 85.3 01/30/2018   PLT 198 01/30/2018   CMP 07/31/2008  Glucose 82  BUN 4(L)  Creatinine 0.44  Sodium 139  Potassium 3.2(L)  Chloride 109  CO2 24  Calcium 8.8    Discharge instruction: per After Visit Summary and "Baby and Me Booklet".  After visit meds:  Allergies as of 02/01/2018   No Known Allergies     Medication List    TAKE these medications   acetaminophen 325 MG tablet Commonly known as:  TYLENOL Take 650 mg by mouth every 6 (six) hours as needed for mild pain or headache.   flintstones complete 60 MG chewable tablet Chew 1 tablet by mouth daily. What changed:  how much to take   ibuprofen 600 MG tablet  Commonly known as:  ADVIL,MOTRIN Take 1 tablet (600 mg total) by mouth every 6 (six) hours.   omeprazole 20 MG capsule Commonly known as:  PRILOSEC Take 1 capsule (20 mg total) by mouth daily. 1 tablet a day       Diet: routine diet  Activity: Advance as tolerated. Pelvic rest for 6 weeks.   Outpatient follow up:6 weeks Follow up Appt: Future Appointments  Date Time Provider Department Center  02/04/2018 10:00 AM Cresenzo-Dishmon, Scarlette Calico, CNM [facility]   Follow up Visit:No follow-ups on file.  Postpartum contraception: Vasectomy  Newborn Data: Live born female  Birth Weight: 8 lb 2 oz (3685 g) APGAR: 9, 9  Newborn Delivery   Birth date/time:  01/30/2018 20:11:00 Delivery type:  Vaginal, Spontaneous     Baby Feeding: Breast Disposition:home with mother   Caryl Ada, DO OB Fellow Center for [facility], [facility]

## 2018-02-01 NOTE — Lactation Note (Signed)
This note was copied from a baby's chart. Lactation Consultation Note  Patient Name: Jessica Ward ZOXWR'UToday's Date: 02/01/2018   P4, Baby 38 hours old. Mother denies questions or concerns. Mother states baby has been cluster feeding. Mom encouraged to feed baby 8-12 times/24 hours and with feeding cues.  Reviewed engorgement care and monitoring voids/stools.      Maternal Data    Feeding Feeding Type: Breast Fed Length of feed: 10 min  LATCH Score Latch: Grasps breast easily, tongue down, lips flanged, rhythmical sucking.  Audible Swallowing: A few with stimulation  Type of Nipple: Everted at rest and after stimulation  Comfort (Breast/Nipple): Soft / non-tender  Hold (Positioning): No assistance needed to correctly position infant at breast.  LATCH Score: 9  Interventions    Lactation Tools Discussed/Used     Consult Status      Dahlia ByesBerkelhammer, Ruth [facility] 02/01/2018, 10:34 AM

## 2018-02-01 NOTE — Discharge Instructions (Signed)

## 2018-02-04 ENCOUNTER — Encounter: Payer: Medicaid Other | Admitting: Advanced Practice Midwife

## 2018-03-08 ENCOUNTER — Encounter (INDEPENDENT_AMBULATORY_CARE_PROVIDER_SITE_OTHER): Payer: Self-pay

## 2018-03-08 ENCOUNTER — Ambulatory Visit (INDEPENDENT_AMBULATORY_CARE_PROVIDER_SITE_OTHER): Payer: Medicaid Other | Admitting: Women's Health

## 2018-03-08 ENCOUNTER — Encounter: Payer: Self-pay | Admitting: Women's Health

## 2018-03-08 ENCOUNTER — Other Ambulatory Visit: Payer: Self-pay | Admitting: Obstetrics and Gynecology

## 2018-03-08 ENCOUNTER — Ambulatory Visit (INDEPENDENT_AMBULATORY_CARE_PROVIDER_SITE_OTHER): Payer: Medicaid Other | Admitting: Obstetrics and Gynecology

## 2018-03-08 DIAGNOSIS — Z3202 Encounter for pregnancy test, result negative: Secondary | ICD-10-CM | POA: Diagnosis not present

## 2018-03-08 DIAGNOSIS — Z3009 Encounter for other general counseling and advice on contraception: Secondary | ICD-10-CM | POA: Diagnosis not present

## 2018-03-08 DIAGNOSIS — K429 Umbilical hernia without obstruction or gangrene: Secondary | ICD-10-CM | POA: Diagnosis not present

## 2018-03-08 LAB — POCT URINE PREGNANCY: Preg Test, Ur: NEGATIVE

## 2018-03-08 NOTE — Progress Notes (Signed)
Preoperative History and Physical  Jessica Ward is a 27 y.o. [id number] here for surgical management of desire for permanent sterilization , and she has a small  Umbilical hernia. The hernia is non reducible, 1 cm diameter, longstanding and slightly tender..   No significant preoperative concerns.  Proposed surgery: 1. Laparoscopic bilateral salpingectomy, 2 umbilical herniorraphy  Past Medical History:  Diagnosis Date  . High cholesterol   . Miscarriage   . Normal delivery 10/27/2012   Past Surgical History:  Procedure Laterality Date  . NO PAST SURGERIES     OB History  Gravida Para Term Preterm AB Living  SAB TAB Ectopic Multiple Live Births  1     0 4    # Outcome Date GA Lbr Len/2nd Weight Sex Delivery Anes PTL Lv  5 Term 01/30/18 [redacted]w[redacted]d 06:37 / 00:04 8 lb 2 oz (3.685 kg) M Vag-Spont EPI  LIV  4 SAB 11/2016          3 Term 08/14/15 [redacted]w[redacted]d 09:37 / 00:16 7 lb 9.3 oz (3.44 kg) M Vag-Spont EPI N LIV  2 Term 10/27/12 [redacted]w[redacted]d 16:10 / 00:09 7 lb 0.4 oz (3.185 kg) F Vag-Spont EPI N LIV  1 Term 12/02/08 [redacted]w[redacted]d  7 lb 2 oz (3.232 kg) F Vag-Spont EPI N LIV  Patient denies any other pertinent gynecologic issues.   Current Outpatient Medications on File Prior to Visit  Medication Sig Dispense Refill  . ibuprofen (ADVIL,MOTRIN) 600 MG tablet Take 1 tablet (600 mg total) by mouth every 6 (six) hours. (Patient not taking: Reported on 03/08/2018) 30 tablet 0  . omeprazole (PRILOSEC) 20 MG capsule Take 1 capsule (20 mg total) by mouth daily. 1 tablet a day (Patient not taking: Reported on 03/08/2018) 30 capsule 6   No current facility-administered medications on file prior to visit.    No Known Allergies  Social History:   reports that she has never smoked. She has never used smokeless tobacco. She reports that she does not drink alcohol or use drugs.  Family History  Problem Relation Age of Onset  . Cancer Other        leukemia, breast-maternal great grandma  . Hypertension Mother    . Diabetes Maternal Grandfather   . Coronary artery disease Maternal Grandfather   . Heart attack Maternal Grandfather   . Stroke Maternal Grandfather   . Hyperlipidemia Father   . Heart disease Father        MI 12/2014  . Heart attack Father   . Other Daughter        tubes in ears  . Other Daughter        tubes in ears  . Hypercholesterolemia Paternal Aunt     Review of Systems: Noncontributory  PHYSICAL EXAM: currently breastfeeding. General appearance - alert, well appearing, and in no distress Chest - clear to auscultation, no wheezes, rales or rhonchi, symmetric air entry Heart - normal rate and regular rhythm Abdomen - soft, nontender, nondistended, no masses or organomegaly                     Small 1 cm umbilical nodule consistent with umbilical hernia, no suspicion of bowel involvement. Pelvic - examination by Joellyn Haff , see her notes this date. Extremities - peripheral pulses normal, no pedal edema, no clubbing or cyanosis  Labs: Results for orders placed or performed in visit on 03/08/18 (from the past 336 hour(s))  POCT urine  pregnancy   Collection Time: 03/08/18 11:18 AM  Result Value Ref Range   Preg Test, Ur Negative Negative    Imaging Studies: No results found.  Assessment: Patient Active Problem List   Diagnosis Date Noted  . Flu 11/30/2017  . Supervision of normal pregnancy 07/01/2017  . Spontaneous abortion without complication 12/22/2016  . Rubella non-immune status, antepartum 02/05/2015    Plan: Patient will undergo surgical management with laparoscopic tubal ligation , fallope ring application, and umbilical hernia repair..    .mec 03/08/2018 5:20 PM

## 2018-03-08 NOTE — Progress Notes (Signed)
POSTPARTUM VISIT Patient name: Jessica Ward MRN [id number]  Date of birth: 14-Nov-1990 Chief Complaint:   Postpartum Care  History of Present Illness:   Jessica Ward is a 27 y.o. [id number] Caucasian female being seen today for a postpartum visit. She is 5 weeks postpartum following a spontaneous vaginal delivery at 39.2 gestational weeks. Anesthesia: epidural. I have fully reviewed the prenatal and intrapartum course. Pregnancy uncomplicated. Postpartum course has been uncomplicated. Does report constant pain to Rt of umbilicus x 3d. No fever/chills, n/v. Bleeding no bleeding. Bowel function is normal. Bladder function is normal.  Patient is not sexually active. Last sexual activity: prior to birth of baby.  Contraception method is wants BTL.  [place] Postpartum Depression Screening: negative. Score 0.   Last pap 01/31/15.  Results were normal .  No LMP recorded. (Menstrual status: Lactating).  Baby's course has been uncomplicated. Baby is feeding by breast.  Review of Systems:   Pertinent items are noted in HPI Denies Abnormal vaginal discharge w/ itching/odor/irritation, headaches, visual changes, shortness of breath, chest pain, abdominal pain, severe nausea/vomiting, or problems with urination or bowel movements. Pertinent History Reviewed:  Reviewed past medical,surgical, obstetrical and family history.  Reviewed problem list, medications and allergies. OB History  Gravida Para Term Preterm AB Living  SAB TAB Ectopic Multiple Live Births  1     0 4    # Outcome Date GA Lbr Len/2nd Weight Sex Delivery Anes PTL Lv  5 Term 01/30/18 [redacted]w[redacted]d 06:37 / 00:04 8 lb 2 oz (3.685 kg) M Vag-Spont EPI  LIV  4 SAB 11/2016          3 Term 08/14/15 [redacted]w[redacted]d 09:37 / 00:16 7 lb 9.3 oz (3.44 kg) M Vag-Spont EPI N LIV  2 Term 10/27/12 [redacted]w[redacted]d 16:10 / 00:09 7 lb 0.4 oz (3.185 kg) F Vag-Spont EPI N LIV  1 Term 12/02/08 [redacted]w[redacted]d  7 lb 2 oz (3.232 kg) F Vag-Spont EPI N LIV   Physical Assessment:    Vitals:   03/08/18 1116  BP: 112/60  Pulse: 78  Weight: 188 lb (85.3 kg)  Height:  (1.626 m)  Body mass index is 32.27 kg/m.       Physical Examination:   General appearance: alert, well appearing, and in no distress  Mental status: alert, oriented to person, place, and time  Skin: warm & dry   Cardiovascular: normal heart rate noted   Respiratory: normal respiratory effort, no distress   Breasts: deferred, no complaints   Abdomen: soft, non-tender, co-exam w/ JVF, feels she has umbilical hernia w/ some fat protruding through that is causing tenderness at umbilicus and to Rt of umbilicus   Pelvic: VULVA: normal appearing vulva with no masses, tenderness or lesions, UTERUS: uterus is normal size, shape, consistency and nontender  Rectal: no hemorrhoids  Extremities: no edema       Results for orders placed or performed in visit on 03/08/18 (from the past 24 hour(s))  POCT urine pregnancy   Collection Time: 03/08/18 11:18 AM  Result Value Ref Range   Preg Test, Ur Negative Negative    Assessment & Plan:  1) Postpartum exam 2) 5 wks s/p SVB 3) Breastfeeding 4) Depression screening 5) Contraception counseling, pt prefers tubal ligation  6) Symptomatic umbilical hernia> wants repair w/ BTL  Meds: No orders of the defined types were placed in this encounter.   Follow-up: Return for per JVF.   Orders Placed This Encounter  Procedures  . POCT urine pregnancy    Cheral Marker CNM, [facility] 03/08/18

## 2018-03-16 MED ORDER — OXYCODONE HCL 5 MG/5ML PO SOLN: 5.0000 mg | Freq: Once | ORAL | Status: DC | PRN

## 2018-03-16 MED ORDER — OXYCODONE HCL 5 MG PO TABS: 5.0000 mg | ORAL_TABLET | Freq: Once | ORAL | Status: DC | PRN

## 2018-03-16 MED ORDER — FENTANYL CITRATE (PF) 100 MCG/2ML IJ SOLN: 25.0000 ug | INTRAMUSCULAR | Status: DC | PRN

## 2018-03-16 MED ORDER — ONDANSETRON HCL 4 MG/2ML IJ SOLN: 4.0000 mg | Freq: Once | INTRAMUSCULAR | Status: DC | PRN

## 2018-03-16 MED ORDER — ACETAMINOPHEN 325 MG PO TABS: 325.0000 mg | ORAL_TABLET | ORAL | Status: DC | PRN

## 2018-03-16 MED ORDER — ACETAMINOPHEN 160 MG/5ML PO SOLN: 325.0000 mg | ORAL | Status: DC | PRN

## 2018-03-16 MED ORDER — MEPERIDINE HCL 50 MG/ML IJ SOLN: 6.2500 mg | INTRAMUSCULAR | Status: DC | PRN

## 2018-03-16 NOTE — Patient Instructions (Addendum)
Jessica Ward  03/16/2018     @   Your procedure is scheduled on 03/23/2018.  Report to [facility] at 8:00 A.M.  Call this number if you have problems the morning of surgery:  [phone]   Remember:  Nothing to eat or drink after midnight     Take these medicines the morning of surgery with A SIP OF WATER Prilosec    Do not wear jewelry, make-up or nail polish.  Do not wear lotions, powders, or perfumes, or deodorant.  Do not shave 48 hours prior to surgery.  Men may shave face and neck.  Do not bring valuables to the hospital.  [facility] is not responsible for any belongings or valuables.  Contacts, dentures or bridgework may not be worn into surgery.  Leave your suitcase in the car.  After surgery it may be brought to your room.  For patients admitted to the hospital, discharge time will be determined by your treatment team.  Patients discharged the day of surgery will not be allowed to drive home.   Please read over the following fact sheets that you were given. Surgical Site Infection Prevention and Anesthesia Post-op Instructions     PATIENT INSTRUCTIONS POST-ANESTHESIA  IMMEDIATELY FOLLOWING SURGERY:  Do not drive or operate machinery for the first twenty four hours after surgery.  Do not make any important decisions for twenty four hours after surgery or while taking narcotic pain medications or sedatives.  If you develop intractable nausea and vomiting or a severe headache please notify your doctor immediately.  FOLLOW-UP:  Please make an appointment with your surgeon as instructed. You do not need to follow up with anesthesia unless specifically instructed to do so.  WOUND CARE INSTRUCTIONS (if applicable):  Keep a dry clean dressing on the anesthesia/puncture wound site if there is drainage.  Once the wound has quit draining you may leave it open to air.  Generally you should leave the bandage intact for twenty four hours unless there is  drainage.  If the epidural site drains for more than 36-48 hours please call the anesthesia department.  QUESTIONS?:  Please feel free to call your physician or the hospital operator if you have any questions, and they will be happy to assist you.      Hernia, Adult A hernia is the bulging of an organ or tissue through a weak spot in the muscles of the abdomen (abdominal wall). Hernias develop most often near the navel or groin. There are many kinds of hernias. Common kinds include:  Femoral hernia. This kind of hernia develops under the groin in the upper thigh area.  Inguinal hernia. This kind of hernia develops in the groin or scrotum.  Umbilical hernia. This kind of hernia develops near the navel.  Hiatal hernia. This kind of hernia causes part of the stomach to be pushed up into the chest.  Incisional hernia. This kind of hernia bulges through a scar from an abdominal surgery.  What are the causes? This condition may be caused by:  Heavy lifting.  Coughing over a long period of time.  Straining to have a bowel movement.  An incision made during an abdominal surgery.  A birth defect (congenital defect).  Excess weight or obesity.  Smoking.  Poor nutrition.  Cystic fibrosis.  Excess fluid in the abdomen.  Undescended testicles.  What are the signs or symptoms? Symptoms of a hernia include:  A lump on the abdomen. This is the first sign of  a hernia. The lump may become more obvious with standing, straining, or coughing. It may get bigger over time if it is not treated or if the condition causing it is not treated.  Pain. A hernia is usually painless, but it may become painful over time if treatment is delayed. The pain is usually dull and may get worse with standing or lifting heavy objects.  Sometimes a hernia gets tightly squeezed in the weak spot (strangulated) or stuck there (incarcerated) and causes additional symptoms. These symptoms may  include:  Vomiting.  Nausea.  Constipation.  Irritability.  How is this diagnosed? A hernia may be diagnosed with:  A physical exam. During the exam your health care provider may ask you to cough or to make a specific movement, because a hernia is usually more visible when you move.  Imaging tests. These can include: ? X-rays. ? Ultrasound. ? CT scan.  How is this treated? A hernia that is small and painless may not need to be treated. A hernia that is large or painful may be treated with surgery. Inguinal hernias may be treated with surgery to prevent incarceration or strangulation. Strangulated hernias are always treated with surgery, because lack of blood to the trapped organ or tissue can cause it to die. Surgery to treat a hernia involves pushing the bulge back into place and repairing the weak part of the abdomen. Follow these instructions at home:  Avoid straining.  Do not lift anything heavier than 10 lb (4.5 kg).  Lift with your leg muscles, not your back muscles. This helps avoid strain.  When coughing, try to cough gently.  Prevent constipation. Constipation leads to straining with bowel movements, which can make a hernia worse or cause a hernia repair to break down. You can prevent constipation by: ? Eating a high-fiber diet that includes plenty of fruits and vegetables. ? Drinking enough fluids to keep your urine clear or pale yellow. Aim to drink 6-8 glasses of water per day. ? Using a stool softener as directed by your health care provider.  Lose weight, if you are overweight.  Do not use any tobacco products, including cigarettes, chewing tobacco, or electronic cigarettes. If you need help quitting, ask your health care provider.  Keep all follow-up visits as directed by your health care provider. This is important. Your health care provider may need to monitor your condition. Contact a health care provider if:  You have swelling, redness, and pain in the  affected area.  Your bowel habits change. Get help right away if:  You have a fever.  You have abdominal pain that is getting worse.  You feel nauseous or you vomit.  You cannot push the hernia back in place by gently pressing on it while you are lying down.  The hernia: ? Changes in shape or size. ? Is stuck outside the abdomen. ? Becomes discolored. ? Feels hard or tender. This information is not intended to replace advice given to you by your health care provider. Make sure you discuss any questions you have with your health care provider. Document Released: 10/06/2005 Document Revised: 03/05/2016 Document Reviewed: 08/16/2014 Elsevier Interactive Patient Education  2017 [employer] Salpingectomy Salpingectomy, also called tubectomy, is the surgical removal of one of the fallopian tubes. The fallopian tubes are where eggs travel from the ovaries to the uterus. Removing one fallopian tube does not prevent you from becoming pregnant. It also does not cause problems with your menstrual periods. You may need a salpingectomy  if you:  Have a fertilized egg that attaches to the fallopian tube (ectopic pregnancy), especially one that causes the tube to burst or tear (rupture).  Have an infected fallopian tube.  Have cancer of the fallopian tube or nearby organs.  Have had an ovary removed due to a cyst or tumor.  Have had your uterus removed.  There are three different methods that can be used for a salpingectomy:  Open. This method involves making one large incision in your abdomen.  Laparoscopic. This method involves using a thin, lighted tube with a tiny camera on the end (laparoscope) to help perform the procedure. The laparoscope will allow your surgeon to make several small incisions in the abdomen instead of a large incision.  Robot-assisted: This method involves using a computer to control surgical instruments that are attached to robotic arms.  Tell a health care  provider about:  Any allergies you have.  All medicines you are taking, including vitamins, herbs, eye drops, creams, and over-the-counter medicines.  Any problems you or family members have had with anesthetic medicines.  Any blood disorders you have.  Any surgeries you have had.  Any medical conditions you have.  Whether you are pregnant or may be pregnant. What are the risks? Generally, this is a safe procedure. However, problems may occur, including:  Infection.  Bleeding.  Allergic reactions to medicines.  Damage to other structures or organs.  Blood clots in the legs or lungs.  What happens before the procedure? Staying hydrated Follow instructions from your health care provider about hydration, which may include:  Up to 2 hours before the procedure - you may continue to drink clear liquids, such as water, clear fruit juice, black coffee, and plain tea.  Eating and drinking restrictions Follow instructions from your health care provider about eating and drinking, which may include:  8 hours before the procedure - stop eating heavy meals or foods such as meat, fried foods, or fatty foods.  6 hours before the procedure - stop eating light meals or foods, such as toast or cereal.  6 hours before the procedure - stop drinking milk or drinks that contain milk.  2 hours before the procedure - stop drinking clear liquids.  Medicines  Ask your health care provider about: ? Changing or stopping your regular medicines. This is especially important if you are taking diabetes medicines or blood thinners. ? Taking medicines such as aspirin and ibuprofen. These medicines can thin your blood. Do not take these medicines before your procedure if your health care provider instructs you not to.  You may be given antibiotic medicine to help prevent infection. General instructions  Do not smoke for at least 2 weeks before your procedure. If you need help quitting, ask your  health care provider.  You may have an exam or tests, such as an electrocardiogram (ECG).  You may have a blood or urine sample taken.  Ask your health care provider: ? Whether you should stop removing hair from your surgical area. ? How your surgical site will be marked or identified.  You may be asked to shower with a germ-killing soap.  Plan to have someone take you home from the hospital or clinic.  If you will be going home right after the procedure, plan to have someone with you for 24 hours. What happens during the procedure?  To reduce your risk of infection: ? Your health care team will wash or sanitize their hands. ? Hair may be removed  from the surgical area. ? Your skin will be washed with soap.  An IV tube will be inserted into one of your veins.  You will be given a medicine to make you fall asleep (general anesthetic). You may also be given a medicine to help you relax (sedative).  A thin tube (catheter) may be inserted through your urethra and into your bladder to drain urine during your procedure.  Depending on the type of procedure you are having, one incision or several small incisions will be made in your abdomen.  Your fallopian tube will be cut and removed from where it attaches to your uterus.  Your blood vessels will be clamped and tied to prevent excess bleeding.  The incision(s) in your abdomen will be closed with stitches (sutures), staples, or skin glue.  A bandage (dressing) may be placed over your incision(s). The procedure may vary among health care providers and hospitals. What happens after the procedure?  Your blood pressure, heart rate, breathing rate, and blood oxygen level will be monitored until the medicines you were given have worn off.  You may continue to receive fluids and medicines through an IV tube.  You may continue to have a catheter draining your urine.  You may have to wear compression stockings. These stockings help to  prevent blood clots and reduce swelling in your legs.  You will be given pain medicine as needed.  Do not drive for 24 hours if you received a sedative. Summary  Salpingectomy is a surgical procedure to remove one of the fallopian tubes.  The procedure may be done with an open incision, with a laparoscope, or with computer-controlled instruments.  Depending on the type of procedure you are having, one incision or several small incisions will be made in your abdomen.  Your blood pressure, heart rate, breathing rate, and blood oxygen level will be monitored until the medicines you were given have worn off.  Plan to have someone take you home from the hospital or clinic. This information is not intended to replace advice given to you by your health care provider. Make sure you discuss any questions you have with your health care provider. Document Released: 02/22/2009 Document Revised: 05/23/2016 Document Reviewed: 03/30/2013 Elsevier Interactive Patient Education  [employer].

## 2018-03-17 ENCOUNTER — Encounter (HOSPITAL_COMMUNITY)
Admission: RE | Admit: 2018-03-17 | Discharge: 2018-03-17 | Disposition: A | Discharge status: Home / Self Care | Payer: Medicaid Other | Source: Ambulatory Visit | Attending: Obstetrics and Gynecology | Admitting: Obstetrics and Gynecology

## 2018-03-17 ENCOUNTER — Encounter (HOSPITAL_COMMUNITY): Payer: Self-pay

## 2018-03-17 DIAGNOSIS — Z01812 Encounter for preprocedural laboratory examination: Secondary | ICD-10-CM | POA: Insufficient documentation

## 2018-03-17 LAB — CBC WITH DIFFERENTIAL/PLATELET
Basophils Absolute: 0 10*3/uL (ref 0.0–0.1)
Basophils Relative: 0 %
Eosinophils Absolute: 0 10*3/uL (ref 0.0–0.7)
Eosinophils Relative: 1 %
HEMATOCRIT: 38.1 % (ref 36.0–46.0)
HEMOGLOBIN: 12.4 g/dL (ref 12.0–15.0)
LYMPHS ABS: 1.7 10*3/uL (ref 0.7–4.0)
Lymphocytes Relative: 32 %
MCH: 27.5 pg (ref 26.0–34.0)
MCHC: 32.5 g/dL (ref 30.0–36.0)
MCV: 84.5 fL (ref 78.0–100.0)
MONO ABS: 0.3 10*3/uL (ref 0.1–1.0)
Monocytes Relative: 7 %
NEUTROS ABS: 3.1 10*3/uL (ref 1.7–7.7)
NEUTROS PCT: 60 %
Platelets: 294 10*3/uL (ref 150–400)
RBC: 4.51 MIL/uL (ref 3.87–5.11)
RDW: 14.4 % (ref 11.5–15.5)
WBC: 5.2 10*3/uL (ref 4.0–10.5)

## 2018-03-17 LAB — BASIC METABOLIC PANEL
ANION GAP: 10 (ref 5–15)
BUN: 16 mg/dL (ref 6–20)
CHLORIDE: 104 mmol/L (ref 101–111)
CO2: 26 mmol/L (ref 22–32)
Calcium: 9.1 mg/dL (ref 8.9–10.3)
Creatinine, Ser: 0.89 mg/dL (ref 0.44–1.00)
GFR calc non Af Amer: >60 mL/min (ref >60)
GLUCOSE: 78 mg/dL (ref 65–99)
Potassium: 4.1 mmol/L (ref 3.5–5.1)
Sodium: 140 mmol/L (ref 135–145)

## 2018-03-17 LAB — HCG, SERUM, QUALITATIVE: Preg, Serum: NEGATIVE

## 2018-03-22 NOTE — H&P (Signed)
Preoperative History and Physical  Jessica Ward is a 27 y.o. [id number] here for surgical management of desire for permanent sterilization , and she has a small  Umbilical hernia. The hernia is non reducible, 1 cm diameter, longstanding and slightly tender..   No significant preoperative concerns.  Proposed surgery: 1. Laparoscopic bilateral salpingectomy, 2 umbilical herniorraphy      Past Medical History:  Diagnosis Date  . High cholesterol   . Miscarriage   . Normal delivery 10/27/2012        Past Surgical History:  Procedure Laterality Date  . NO PAST SURGERIES                     OB History  Gravida Para Term Preterm AB Living  5 4 4   1 4   SAB TAB Ectopic Multiple Live Births     1     0 4       # Outcome Date GA Lbr Len/2nd Weight Sex Delivery Anes PTL Lv  5 Term 01/30/18 [redacted]w[redacted]d 06:37 / 00:04 8 lb 2 oz (3.685 kg) M Vag-Spont EPI  LIV  4 SAB 11/2016          3 Term 08/14/15 [redacted]w[redacted]d 09:37 / 00:16 7 lb 9.3 oz (3.44 kg) M Vag-Spont EPI N LIV  2 Term 10/27/12 [redacted]w[redacted]d 16:10 / 00:09 7 lb 0.4 oz (3.185 kg) F Vag-Spont EPI N LIV  1 Term 12/02/08 [redacted]w[redacted]d  7 lb 2 oz (3.232 kg) F Vag-Spont EPI N LIV  Patient denies any other pertinent gynecologic issues.         Current Outpatient Medications on File Prior to Visit  Medication Sig Dispense Refill  . ibuprofen (ADVIL,MOTRIN) 600 MG tablet Take 1 tablet (600 mg total) by mouth every 6 (six) hours. (Patient not taking: Reported on 03/08/2018) 30 tablet 0  . omeprazole (PRILOSEC) 20 MG capsule Take 1 capsule (20 mg total) by mouth daily. 1 tablet a day (Patient not taking: Reported on 03/08/2018) 30 capsule 6   No current facility-administered medications on file prior to visit.    No Known Allergies  Social History:   reports that she has never smoked. She has never used smokeless tobacco. She reports that she does not drink alcohol or use drugs.       Family History  Problem Relation Age of Onset  . Cancer Other         leukemia, breast-maternal great grandma  . Hypertension Mother   . Diabetes Maternal Grandfather   . Coronary artery disease Maternal Grandfather   . Heart attack Maternal Grandfather   . Stroke Maternal Grandfather   . Hyperlipidemia Father   . Heart disease Father        MI 12/2014  . Heart attack Father   . Other Daughter        tubes in ears  . Other Daughter        tubes in ears  . Hypercholesterolemia Paternal Aunt     Review of Systems: Noncontributory  PHYSICAL EXAM: currently breastfeeding. General appearance - alert, well appearing, and in no distress Chest - clear to auscultation, no wheezes, rales or rhonchi, symmetric air entry Heart - normal rate and regular rhythm Abdomen - soft, nontender, nondistended, no masses or organomegaly                     Small 1 cm umbilical nodule consistent with umbilical hernia, no suspicion of bowel involvement. Pelvic -  examination by Jessica Ward , see her notes this date. Extremities - peripheral pulses normal, no pedal edema, no clubbing or cyanosis  Labs:      Results for orders placed or performed in visit on 03/08/18 (from the past 336 hour(s))  POCT urine pregnancy   Collection Time: 03/08/18 11:18 AM  Result Value Ref Range   Preg Test, Ur Negative Negative   CBC    Component Value Date/Time   WBC 5.2 03/17/2018 1504   RBC 4.51 03/17/2018 1504   HGB 12.4 03/17/2018 1504   HGB 11.5 11/03/2017 0859   HCT 38.1 03/17/2018 1504   HCT 33.9 (L) 11/03/2017 0859   PLT 294 03/17/2018 1504   PLT 200 11/03/2017 0859   MCV 84.5 03/17/2018 1504   MCV 90 11/03/2017 0859   MCH 27.5 03/17/2018 1504   MCHC 32.5 03/17/2018 1504   RDW 14.4 03/17/2018 1504   RDW 13.9 11/03/2017 0859   LYMPHSABS 1.7 03/17/2018 1504   MONOABS 0.3 03/17/2018 1504   EOSABS 0.0 03/17/2018 1504   BASOSABS 0.0 03/17/2018 1504   BMET    Component Value Date/Time   NA 140 03/17/2018 1504   K 4.1 03/17/2018 1504    CL 104 03/17/2018 1504   CO2 26 03/17/2018 1504   GLUCOSE 78 03/17/2018 1504   BUN 16 03/17/2018 1504   CREATININE 0.89 03/17/2018 1504   CALCIUM 9.1 03/17/2018 1504   GFRNONAA >60 03/17/2018 1504   GFRAA >60 03/17/2018 1504     Imaging Studies: ImagingResults  No results found.    Assessment:     Patient Active Problem List   Diagnosis Date Noted  . Flu 11/30/2017  . Supervision of normal pregnancy 07/01/2017  . Spontaneous abortion without complication 12/22/2016  . Rubella non-immune status, antepartum 02/05/2015    Plan: Patient will undergo surgical management with laparoscopic tubal ligation , fallope ring application, and umbilical hernia repair.Tilda Burrow.   Chey Rachels V Marshay Slates, MD  .mec 03/08/2018 5:20 PM

## 2018-03-23 ENCOUNTER — Ambulatory Visit (HOSPITAL_COMMUNITY)
Admission: RE | Admit: 2018-03-23 | Discharge: 2018-03-23 | Disposition: A | Discharge status: Home / Self Care | Payer: Medicaid Other | Source: Ambulatory Visit | Attending: Obstetrics and Gynecology | Admitting: Obstetrics and Gynecology

## 2018-03-23 ENCOUNTER — Ambulatory Visit (HOSPITAL_COMMUNITY): Payer: Medicaid Other | Admitting: Anesthesiology

## 2018-03-23 ENCOUNTER — Encounter (HOSPITAL_COMMUNITY): Payer: Self-pay | Admitting: *Deleted

## 2018-03-23 ENCOUNTER — Encounter (HOSPITAL_COMMUNITY)
Admission: RE | Disposition: A | Discharge status: Home / Self Care | Payer: Self-pay | Source: Ambulatory Visit | Attending: Obstetrics and Gynecology

## 2018-03-23 DIAGNOSIS — E7800 Pure hypercholesterolemia, unspecified: Secondary | ICD-10-CM | POA: Insufficient documentation

## 2018-03-23 DIAGNOSIS — Z79899 Other long term (current) drug therapy: Secondary | ICD-10-CM | POA: Diagnosis not present

## 2018-03-23 DIAGNOSIS — Z302 Encounter for sterilization: Secondary | ICD-10-CM | POA: Insufficient documentation

## 2018-03-23 DIAGNOSIS — K429 Umbilical hernia without obstruction or gangrene: Secondary | ICD-10-CM | POA: Diagnosis not present

## 2018-03-23 DIAGNOSIS — Z3009 Encounter for other general counseling and advice on contraception: Secondary | ICD-10-CM | POA: Diagnosis present

## 2018-03-23 HISTORY — PX: TUBAL LIGATION: SHX77

## 2018-03-23 HISTORY — PX: UMBILICAL HERNIA REPAIR: SHX196

## 2018-03-23 SURGERY — LIGATION, FALLOPIAN TUBE, BILATERAL
Anesthesia: General

## 2018-03-23 MED ORDER — PROPOFOL 10 MG/ML IV BOLUS
INTRAVENOUS | Status: AC
  Filled 2018-03-23: qty 20

## 2018-03-23 MED ORDER — DEXAMETHASONE SODIUM PHOSPHATE 4 MG/ML IJ SOLN
INTRAMUSCULAR | Status: DC | PRN
  Administered 2018-03-23: 4 mg via INTRAVENOUS

## 2018-03-23 MED ORDER — SUGAMMADEX SODIUM 200 MG/2ML IV SOLN
INTRAVENOUS | Status: DC | PRN
  Administered 2018-03-23: 168.8 mg via INTRAVENOUS

## 2018-03-23 MED ORDER — CEFAZOLIN SODIUM-DEXTROSE 2-4 GM/100ML-% IV SOLN
2.0000 g | INTRAVENOUS | Status: AC
  Administered 2018-03-23: 2 g via INTRAVENOUS

## 2018-03-23 MED ORDER — SUCCINYLCHOLINE CHLORIDE 20 MG/ML IJ SOLN
INTRAMUSCULAR | Status: AC
Start: 2018-03-23 — End: ?
  Filled 2018-03-23: qty 1

## 2018-03-23 MED ORDER — HYDROCODONE-ACETAMINOPHEN 5-325 MG PO TABS: 1.0000 | ORAL_TABLET | Freq: Four times a day (QID) | ORAL | 0 refills | Status: AC | PRN

## 2018-03-23 MED ORDER — MEPERIDINE HCL 50 MG/ML IJ SOLN: 6.2500 mg | INTRAMUSCULAR | Status: DC | PRN

## 2018-03-23 MED ORDER — PROPOFOL 10 MG/ML IV BOLUS
INTRAVENOUS | Status: DC | PRN
  Administered 2018-03-23: 150 mg via INTRAVENOUS

## 2018-03-23 MED ORDER — KETOROLAC TROMETHAMINE 30 MG/ML IJ SOLN
INTRAMUSCULAR | Status: AC
  Filled 2018-03-23: qty 1

## 2018-03-23 MED ORDER — KETOROLAC TROMETHAMINE 30 MG/ML IJ SOLN
30.0000 mg | Freq: Once | INTRAMUSCULAR | Status: AC | PRN
  Administered 2018-03-23: 30 mg via INTRAVENOUS

## 2018-03-23 MED ORDER — ONDANSETRON HCL 4 MG/2ML IJ SOLN
INTRAMUSCULAR | Status: DC | PRN
  Administered 2018-03-23: 4 mg via INTRAVENOUS

## 2018-03-23 MED ORDER — FENTANYL CITRATE (PF) 250 MCG/5ML IJ SOLN
INTRAMUSCULAR | Status: AC
Start: 2018-03-23 — End: ?
  Filled 2018-03-23: qty 5

## 2018-03-23 MED ORDER — ROCURONIUM BROMIDE 100 MG/10ML IV SOLN
INTRAVENOUS | Status: DC | PRN
  Administered 2018-03-23: 25 mg via INTRAVENOUS

## 2018-03-23 MED ORDER — FENTANYL CITRATE (PF) 100 MCG/2ML IJ SOLN
INTRAMUSCULAR | Status: AC
  Filled 2018-03-23: qty 2

## 2018-03-23 MED ORDER — BUPIVACAINE HCL (PF) 0.25 % IJ SOLN
INTRAMUSCULAR | Status: DC | PRN
  Administered 2018-03-23: 8 mL

## 2018-03-23 MED ORDER — ROCURONIUM BROMIDE 50 MG/5ML IV SOLN
INTRAVENOUS | Status: AC
  Filled 2018-03-23: qty 1

## 2018-03-23 MED ORDER — LIDOCAINE HCL 1 % IJ SOLN
INTRAMUSCULAR | Status: DC | PRN
  Administered 2018-03-23: 40 mg via INTRADERMAL

## 2018-03-23 MED ORDER — ONDANSETRON HCL 4 MG/2ML IJ SOLN
INTRAMUSCULAR | Status: AC
  Filled 2018-03-23: qty 2

## 2018-03-23 MED ORDER — MIDAZOLAM HCL 2 MG/2ML IJ SOLN
INTRAMUSCULAR | Status: AC
  Filled 2018-03-23: qty 2

## 2018-03-23 MED ORDER — ONDANSETRON HCL 4 MG/2ML IJ SOLN: 4.0000 mg | Freq: Once | INTRAMUSCULAR | Status: DC | PRN

## 2018-03-23 MED ORDER — GLYCOPYRROLATE 0.2 MG/ML IJ SOLN
INTRAMUSCULAR | Status: DC | PRN
  Administered 2018-03-23: 0.2 mg via INTRAVENOUS

## 2018-03-23 MED ORDER — FENTANYL CITRATE (PF) 100 MCG/2ML IJ SOLN
INTRAMUSCULAR | Status: DC | PRN
  Administered 2018-03-23 (×4): 50 ug via INTRAVENOUS

## 2018-03-23 MED ORDER — HYDROMORPHONE HCL 1 MG/ML IJ SOLN
0.2500 mg | INTRAMUSCULAR | Status: DC | PRN
  Administered 2018-03-23 (×2): 0.5 mg via INTRAVENOUS
  Filled 2018-03-23 (×2): qty 0.5

## 2018-03-23 MED ORDER — SUCCINYLCHOLINE CHLORIDE 20 MG/ML IJ SOLN
INTRAMUSCULAR | Status: DC | PRN
  Administered 2018-03-23: 120 mg via INTRAVENOUS

## 2018-03-23 MED ORDER — HYDROCODONE-ACETAMINOPHEN 7.5-325 MG PO TABS: 1.0000 | ORAL_TABLET | Freq: Once | ORAL | Status: DC | PRN

## 2018-03-23 MED ORDER — POVIDONE-IODINE 10 % EX OINT
TOPICAL_OINTMENT | CUTANEOUS | Status: AC
  Filled 2018-03-23: qty 1

## 2018-03-23 MED ORDER — LIDOCAINE HCL (PF) 1 % IJ SOLN
INTRAMUSCULAR | Status: AC
  Filled 2018-03-23: qty 20

## 2018-03-23 MED ORDER — MIDAZOLAM HCL 5 MG/5ML IJ SOLN
INTRAMUSCULAR | Status: DC | PRN
  Administered 2018-03-23 (×2): 2 mg via INTRAVENOUS

## 2018-03-23 MED ORDER — LACTATED RINGERS IV SOLN
INTRAVENOUS | Status: DC
  Administered 2018-03-23 (×2): via INTRAVENOUS

## 2018-03-23 MED ORDER — BUPIVACAINE HCL (PF) 0.25 % IJ SOLN
INTRAMUSCULAR | Status: AC
  Filled 2018-03-23: qty 30

## 2018-03-23 MED ORDER — LIDOCAINE HCL (PF) 1 % IJ SOLN
INTRAMUSCULAR | Status: AC
  Filled 2018-03-23: qty 5

## 2018-03-23 MED ORDER — GLYCOPYRROLATE 0.2 MG/ML IJ SOLN
INTRAMUSCULAR | Status: AC
  Filled 2018-03-23: qty 1

## 2018-03-23 MED ORDER — CEFAZOLIN SODIUM-DEXTROSE 2-4 GM/100ML-% IV SOLN
INTRAVENOUS | Status: AC
  Filled 2018-03-23: qty 100

## 2018-03-23 SURGICAL SUPPLY — 53 items
BANDAGE STRIP 1X3 FLEXIBLE (GAUZE/BANDAGES/DRESSINGS) ×12 IMPLANT
BENZOIN TINCTURE PRP APPL 2/3 (GAUZE/BANDAGES/DRESSINGS) ×4 IMPLANT
BLADE SURG SZ11 CARB STEEL (BLADE) ×4 IMPLANT
CATH ROBINSON RED A/P 16FR (CATHETERS) IMPLANT
CLOSURE WOUND 1/4 X3 (GAUZE/BANDAGES/DRESSINGS) ×1
CLOTH BEACON ORANGE TIMEOUT ST (SAFETY) ×4 IMPLANT
COVER LIGHT HANDLE STERIS (MISCELLANEOUS) ×8 IMPLANT
DECANTER SPIKE VIAL GLASS SM (MISCELLANEOUS) ×4 IMPLANT
DURAPREP 26ML APPLICATOR (WOUND CARE) ×4 IMPLANT
ELECT REM PT RETURN 9FT ADLT (ELECTROSURGICAL) ×4
ELECTRODE REM PT RTRN 9FT ADLT (ELECTROSURGICAL) ×2 IMPLANT
GLOVE BIOGEL PI IND STRL 7.0 (GLOVE) ×4 IMPLANT
GLOVE BIOGEL PI IND STRL 9 (GLOVE) ×2 IMPLANT
GLOVE BIOGEL PI INDICATOR 7.0 (GLOVE) ×4
GLOVE BIOGEL PI INDICATOR 9 (GLOVE) ×2
GLOVE ECLIPSE 9.0 STRL (GLOVE) ×4 IMPLANT
GOWN SPEC L3 XXLG W/TWL (GOWN DISPOSABLE) ×8 IMPLANT
GOWN STRL REUS W/TWL LRG LVL3 (GOWN DISPOSABLE) ×4 IMPLANT
INST SET LAPROSCOPIC GYN AP (KITS) ×4 IMPLANT
INST SET MINOR GENERAL (KITS) ×4 IMPLANT
KIT TURNOVER CYSTO (KITS) ×4 IMPLANT
KIT TURNOVER KIT A (KITS) ×4 IMPLANT
MANIFOLD NEPTUNE II (INSTRUMENTS) ×4 IMPLANT
NEEDLE HYPO 25X1 1.5 SAFETY (NEEDLE) ×4 IMPLANT
NEEDLE INSUFFLATION 14GA 120MM (NEEDLE) ×4 IMPLANT
NS IRRIG 1000ML POUR BTL (IV SOLUTION) ×4 IMPLANT
PACK MINOR (CUSTOM PROCEDURE TRAY) ×4 IMPLANT
PACK PERI GYN (CUSTOM PROCEDURE TRAY) ×4 IMPLANT
PAD ARMBOARD 7.5X6 YLW CONV (MISCELLANEOUS) ×4 IMPLANT
RING FALLOPIAN BANDS (Ring) ×8 IMPLANT
SET BASIN LINEN APH (SET/KITS/TRAYS/PACK) ×4 IMPLANT
SOL PREP PROV IODINE SCRUB 4OZ (MISCELLANEOUS) ×4 IMPLANT
SOLUTION ANTI FOG 6CC (MISCELLANEOUS) ×4 IMPLANT
SPONGE GAUZE 2X2 8PLY STER LF (GAUZE/BANDAGES/DRESSINGS) ×1
SPONGE GAUZE 2X2 8PLY STRL LF (GAUZE/BANDAGES/DRESSINGS) ×3 IMPLANT
STAPLER VISISTAT (STAPLE) ×4 IMPLANT
STRIP CLOSURE SKIN 1/4X3 (GAUZE/BANDAGES/DRESSINGS) ×3 IMPLANT
SUT ETHIBOND NAB MO 7 #0 18IN (SUTURE) IMPLANT
SUT PROLENE 2 0 SH 30 (SUTURE) ×4 IMPLANT
SUT VIC AB 2-0 CT2 27 (SUTURE) ×4 IMPLANT
SUT VIC AB 3-0 SH 27 (SUTURE) ×2
SUT VIC AB 3-0 SH 27X BRD (SUTURE) ×2 IMPLANT
SUT VIC AB 4-0 PS2 27 (SUTURE) ×8 IMPLANT
SUT VIC AB 5-0 P-3 18X BRD (SUTURE) IMPLANT
SUT VIC AB 5-0 P3 18 (SUTURE)
SUT VICRYL AB 3 0 TIES (SUTURE) IMPLANT
SYR 10ML LL (SYRINGE) ×8 IMPLANT
SYR BULB IRRIGATION 50ML (SYRINGE) ×4 IMPLANT
SYR CONTROL 10ML LL (SYRINGE) ×4 IMPLANT
TROCAR KII 8X100ML NONTHREADED (TROCAR) ×4 IMPLANT
TROCAR XCEL NON-BLD 5MMX100MML (ENDOMECHANICALS) ×4 IMPLANT
TUBING INSUFFLATION (TUBING) ×4 IMPLANT
WARMER LAPAROSCOPE (MISCELLANEOUS) ×4 IMPLANT

## 2018-03-23 NOTE — Interval H&P Note (Signed)
History and Physical Interval Note:  03/23/2018 10:40 AM  Jessica Ward  has presented today for surgery, with the diagnosis of UMBILICAL HERNIA STRILIZATION  The various methods of treatment have been discussed with the patient and family. After consideration of risks, benefits and other options for treatment, the patient has consented to  Procedure(s): BILATERAL TUBAL LIGATION WITH FALLOPE RINGS (Bilateral) HERNIA REPAIR UMBILICAL ADULT (N/A) as a surgical intervention .  The patient's history has been reviewed, patient examined, no change in status, stable for surgery.  I have reviewed the patient's chart and labs.  Questions were answered to the patient's satisfaction.  Pt has just finished her menstrual period.   Tilda BurrowJohn V Toney Lizaola

## 2018-03-23 NOTE — Op Note (Signed)
Please see the brief operative note for surgical details 

## 2018-03-23 NOTE — Anesthesia Postprocedure Evaluation (Signed)
Anesthesia Post Note Late Entry for 1215  Patient: Jessica Ward  Procedure(s) Performed: BILATERAL TUBAL LIGATION WITH FALLOPE RINGS (Bilateral ) HERNIA REPAIR UMBILICAL ADULT (N/A )  Patient location during evaluation: PACU Anesthesia Type: General Level of consciousness: awake and alert and oriented Pain management: pain level controlled Respiratory status: spontaneous breathing Cardiovascular status: stable Postop Assessment: no apparent nausea or vomiting Anesthetic complications: no     Last Vitals:  Vitals:   03/23/18 1245 03/23/18 1257  BP: 108/73   Pulse:    Resp:    Temp:  36.5 C  SpO2: 98%     Last Pain:  Vitals:   03/23/18 1245  TempSrc:   PainSc: 4                  ADAMS, AMY A

## 2018-03-23 NOTE — Anesthesia Preprocedure Evaluation (Signed)
Anesthesia Evaluation  Patient identified by MRN, date of birth, ID band Patient awake    Reviewed: Allergy & Precautions, H&P , NPO status , Patient's Chart, lab work & pertinent test results  Airway Mallampati: II  TM Distance: >3 FB Neck ROM: full    Dental no notable dental hx.    Pulmonary neg pulmonary ROS,    Pulmonary exam normal breath sounds clear to auscultation       Cardiovascular Exercise Tolerance: Good negative cardio ROS   Rhythm:regular Rate:Normal     Neuro/Psych negative neurological ROS  negative psych ROS   GI/Hepatic negative GI ROS, Neg liver ROS,   Endo/Other  negative endocrine ROS  Renal/GU negative Renal ROS  negative genitourinary   Musculoskeletal   Abdominal   Peds  Hematology negative hematology ROS (+)   Anesthesia Other Findings   Reproductive/Obstetrics negative OB ROS                             Anesthesia Physical Anesthesia Plan  ASA: I  Anesthesia Plan: General   Post-op Pain Management:    Induction:   PONV Risk Score and Plan:   Airway Management Planned:   Additional Equipment:   Intra-op Plan:   Post-operative Plan:   Informed Consent: I have reviewed the patients History and Physical, chart, labs and discussed the procedure including the risks, benefits and alternatives for the proposed anesthesia with the patient or authorized representative who has indicated his/her understanding and acceptance.   Dental Advisory Given  Plan Discussed with: CRNA  Anesthesia Plan Comments:         Anesthesia Quick Evaluation  

## 2018-03-23 NOTE — Transfer of Care (Signed)
Immediate Anesthesia Transfer of Care Note  Patient: Jessica Ward  Procedure(s) Performed: BILATERAL TUBAL LIGATION WITH FALLOPE RINGS (Bilateral ) HERNIA REPAIR UMBILICAL ADULT (N/A )  Patient Location: PACU  Anesthesia Type:General  Level of Consciousness: awake, alert , oriented and patient cooperative  Airway & Oxygen Therapy: Patient Spontanous Breathing  Post-op Assessment: Report given to RN and Post -op Vital signs reviewed and stable  Post vital signs: Reviewed and stable  Last Vitals:  Vitals Value Taken Time  BP 116/59 03/23/2018 11:47 AM  Temp    Pulse 213 03/23/2018 11:48 AM  Resp 14 03/23/2018 11:48 AM  SpO2 99 % 03/23/2018 11:48 AM  Vitals shown include unvalidated device data.  Last Pain:  Vitals:   03/23/18 0826  TempSrc: Oral  PainSc: 0-No pain      Patients Stated Pain Goal: 8 (03/23/18 [id number])  Complications: No apparent anesthesia complications

## 2018-03-23 NOTE — Anesthesia Procedure Notes (Addendum)
Procedure Name: Intubation Date/Time: 03/23/2018 11:57 AM Performed by: Andree Elk, Amy A, CRNA Pre-anesthesia Checklist: Patient identified, Patient being monitored, Timeout performed, Emergency Drugs available and Suction available Patient Re-evaluated:Patient Re-evaluated prior to induction Oxygen Delivery Method: Circle System Utilized Preoxygenation: Pre-oxygenation with 100% oxygen Induction Type: IV induction, Rapid sequence and Cricoid Pressure applied Laryngoscope Size: 3 and Mac Grade View: Grade I Tube type: Oral Tube size: 7.0 mm Number of attempts: 1 Airway Equipment and Method: Stylet Placement Confirmation: ETT inserted through vocal cords under direct vision,  positive ETCO2 and breath sounds checked- equal and bilateral Secured at: 21 cm Tube secured with: Tape Dental Injury: Teeth and Oropharynx as per pre-operative assessment

## 2018-03-23 NOTE — Discharge Instructions (Signed)
Laparoscopic Tubal Ligation, Care After °Refer to this sheet in the next few weeks. These instructions provide you with information about caring for yourself after your procedure. Your health care provider may also give you more specific instructions. Your treatment has been planned according to current medical practices, but problems sometimes occur. Call your health care provider if you have any problems or questions after your procedure. °What can I expect after the procedure? °After the procedure, it is common to have: °· A sore throat. °· Discomfort in your shoulder. °· Mild discomfort or cramping in your abdomen. °· Gas pains. °· Pain or soreness in the area where the surgical cut (incision) was made. °· A bloated feeling. °· Tiredness. °· Nausea. °· Vomiting. ° °Follow these instructions at home: °Medicines °· Take over-the-counter and prescription medicines only as told by your health care provider. °· Do not take aspirin because it can cause bleeding. °· Do not drive or operate heavy machinery while taking prescription pain medicine. °Activity °· Rest for the rest of the day. °· Return to your normal activities as told by your health care provider. Ask your health care provider what activities are safe for you. °Incision care ° °· Follow instructions from your health care provider about how to take care of your incision. Make sure you: °? Wash your hands with soap and water before you change your bandage (dressing). If soap and water are not available, use hand sanitizer. °? Change your dressing as told by your health care provider. °? Leave stitches (sutures) in place. They may need to stay in place for 2 weeks or longer. °· Check your incision area every day for signs of infection. Check for: °? More redness, swelling, or pain. °? More fluid or blood. °? Warmth. °? Pus or a bad smell. °Other Instructions °· Do not take baths, swim, or use a hot tub until your health care provider approves. You may take  showers. °· Keep all follow-up visits as told by your health care provider. This is important. °· Have someone help you with your daily household tasks for the first few days. °Contact a health care provider if: °· You have more redness, swelling, or pain around your incision. °· Your incision feels warm to the touch. °· You have pus or a bad smell coming from your incision. °· The edges of your incision break open after the sutures have been removed. °· Your pain does not improve after 2-3 days. °· You have a rash. °· You repeatedly become dizzy or light-headed. °· Your pain medicine is not helping. °· You are constipated. °Get help right away if: °· You have a fever. °· You faint. °· You have increasing pain in your abdomen. °· You have severe pain in one or both of your shoulders. °· You have fluid or blood coming from your sutures or from your vagina. °· You have shortness of breath or difficulty breathing. °· You have chest pain or leg pain. °· You have ongoing nausea, vomiting, or diarrhea. °This information is not intended to replace advice given to you by your health care provider. Make sure you discuss any questions you have with your health care provider. °Document Released: 04/25/2005 Document Revised: 03/10/2016 Document Reviewed: 09/16/2015 °Elsevier Interactive Patient Education © 2018 [employer] ° ° ° °General Anesthesia, Adult, Care After °These instructions provide you with information about caring for yourself after your procedure. Your health care provider may also give you more specific instructions. Your treatment has   been planned according to current medical practices, but problems sometimes occur. Call your health care provider if you have any problems or questions after your procedure. °What can I expect after the procedure? °After the procedure, it is common to have: °· Vomiting. °· A sore throat. °· Mental slowness. ° °It is common to feel: °· Nauseous. °· Cold or  shivery. °· Sleepy. °· Tired. °· Sore or achy, even in parts of your body where you did not have surgery. ° °Follow these instructions at home: °For at least 24 hours after the procedure: °· Do not: °? Participate in activities where you could fall or become injured. °? Drive. °? Use heavy machinery. °? Drink alcohol. °? Take sleeping pills or medicines that cause drowsiness. °? Make important decisions or sign legal documents. °? Take care of children on your own. °· Rest. °Eating and drinking °· If you vomit, drink water, juice, or soup when you can drink without vomiting. °· Drink enough fluid to keep your urine clear or pale yellow. °· Make sure you have little or no nausea before eating solid foods. °· Follow the diet recommended by your health care provider. °General instructions °· Have a responsible adult stay with you until you are awake and alert. °· Return to your normal activities as told by your health care provider. Ask your health care provider what activities are safe for you. °· Take over-the-counter and prescription medicines only as told by your health care provider. °· If you smoke, do not smoke without supervision. °· Keep all follow-up visits as told by your health care provider. This is important. °Contact a health care provider if: °· You continue to have nausea or vomiting at home, and medicines are not helpful. °· You cannot drink fluids or start eating again. °· You cannot urinate after 8-12 hours. °· You develop a skin rash. °· You have fever. °· You have increasing redness at the site of your procedure. °Get help right away if: °· You have difficulty breathing. °· You have chest pain. °· You have unexpected bleeding. °· You feel that you are having a life-threatening or urgent problem. °This information is not intended to replace advice given to you by your health care provider. Make sure you discuss any questions you have with your health care provider. °Document Released: 01/12/2001  Document Revised: 03/10/2016 Document Reviewed: 09/20/2015 °Elsevier Interactive Patient Education © 2018 Elsevier Inc[employer] ° °

## 2018-03-23 NOTE — Interval H&P Note (Signed)
History and Physical Interval Note:  03/23/2018 10:41 AM  Jessica Ward [name]  has presented today for surgery, with the diagnosis of UMBILICAL HERNIA STRILIZATION  The various methods of treatment have been discussed with the patient and family. After consideration of risks, benefits and other options for treatment, the patient has consented to  Procedure(s): BILATERAL TUBAL LIGATION WITH FALLOPE RINGS (Bilateral) HERNIA REPAIR UMBILICAL ADULT (N/A) as a surgical intervention .  The patient's history has been reviewed, patient examined, no change in status, stable for surgery.  I have reviewed the patient's chart and labs.  Questions were answered to the patient's satisfaction.     Tilda BurrowJohn V Jahnya Trindade

## 2018-03-23 NOTE — Op Note (Addendum)
03/23/2018  11:51 AM  PATIENT:  Jessica Ward  27 y.o. female  PRE-OPERATIVE DIAGNOSIS:  UMBILICAL HERNIA; STERILIZATION Fallope Rings  POST-OPERATIVE DIAGNOSIS:  UMBILICAL HERNIA; STERILIZATION  PROCEDURE:  Procedure(s): BILATERAL TUBAL LIGATION WITH FALLOPE RINGS (Bilateral) HERNIA REPAIR UMBILICAL ADULT (N/A)  SURGEON:  Surgeon(s) and Role:    Tilda Burrow* Nazair Fortenberry V, MD - Primary  PHYSICIAN ASSISTANT:   ASSISTANTS: none Shelly RubensteinWendy Kendricks, CST  ANESTHESIA:   local  EBL: Minimal  BLOOD ADMINISTERED:none  DRAINS: none   LOCAL MEDICATIONS USED:  MARCAINE    and Amount: 8 ml  SPECIMEN:  No Specimen  DISPOSITION OF SPECIMEN:  PATHOLOGY  COUNTS:  YES  TOURNIQUET:  * No tourniquets in log *  DICTATION: .Dragon Dictation  PLAN OF CARE: Discharge to home after PACU  PATIENT DISPOSITION:  PACU - hemodynamically stable.   Delay start of Pharmacological VTE agent (>24hrs) due to surgical blood loss or risk of bleeding: not applicable Details of procedure: Patient was taken to the operating room prepped and draped for abdominal and vaginal procedure with timeout conducted.  Patient received Ancef, seizure confirmed by operative team.  An midline vertical supraumbilical incision 1 cm in length was made as well as transverse suprapubic 8 mm incision.  Hemostat was used to probe down and identify the apparent hernia sac which was completely reduced at this time.  The Veress needle was introduced into the abdominal cavity while holding the abdominal cavity wall up and toward the pelvis and oriented needle toward the pelvis.  Water droplet test confirmed easy intraperitoneal egress of fluid and so pneumoperitoneum was achieved under 6 mmHg intra-abdominal pressure and the 5 mm laparoscopic trocar inserted under direct visualization without difficulty and attention to the abdominal contents showed no evidence of trauma or suspicion of complication of peritoneal entry.  The prepubic trocar was  inserted under direct visualization and attention directed to the pelvis.  With a sponge stick in the vagina to manipulate the uterus we were able to identify the ovaries and fallopian tubes bilaterally and they were grossly normal.  The left fallopian tube could be identified to its fimbriated and elevated picked up at its midportion a Falope ring applied without difficulty and the incarcerated knuckle of tube infiltrated with Marcaine solution using a 3 inch spinal needle percutaneously administered the right fallopian tube was treated in similar fashion and photos taken to document successful sterilization procedure the abdomen had 120 cc of saline instilled into it to assist with evacuation of carbon dioxide, the laparoscopic trocar was brought to the abdominal wall elevated and the abdomen decompressed of all carbon dioxide.  The trochars were removed.  Allis clamps were used to grasp the edges of the fascia at the level of the umbilicus and 2 interrupted sutures of 2-0 Prolene were placed side to side across the line to pull together the fascia in the area of potential hernia..  Was taken to stay out of the abdominal cavity with the Allis clamps and the placement of sutures.  The subcuticular closure of 4-0 Vicryl under the skin completed the surgical procedure and Steri-Strips were applied followed by Band-Aids.  Sponge and Needle counts were correct and patient to recovery in good condition.

## 2018-03-24 ENCOUNTER — Encounter (HOSPITAL_COMMUNITY): Payer: Self-pay | Admitting: Obstetrics and Gynecology

## 2018-03-31 ENCOUNTER — Encounter: Payer: Medicaid Other | Admitting: Obstetrics and Gynecology

## 2018-03-31 ENCOUNTER — Ambulatory Visit (INDEPENDENT_AMBULATORY_CARE_PROVIDER_SITE_OTHER): Payer: Medicaid Other | Admitting: Obstetrics and Gynecology

## 2018-03-31 ENCOUNTER — Encounter: Payer: Self-pay | Admitting: Obstetrics and Gynecology

## 2018-03-31 VITALS — BP 102/67 | HR 76 | Ht 64.0 in | Wt 183.4 lb

## 2018-03-31 DIAGNOSIS — Z09 Encounter for follow-up examination after completed treatment for conditions other than malignant neoplasm: Secondary | ICD-10-CM

## 2018-03-31 DIAGNOSIS — Z9889 Other specified postprocedural states: Secondary | ICD-10-CM

## 2018-03-31 NOTE — Progress Notes (Signed)
   Subjective:  Jessica Ward is a 27 y.o. female now 1 weeks status post  tubal ligation, umbilical hernia repair.   Patient is having no complaints she does not remove the Steri-Strips but remove the Band-Aids normal activity no pain Review of Systems Negative except no abnormality   Diet:   Regular   Bowel movements : normal.  The patient is not having any pain.  Objective:  BP 102/67 (BP Location: Right Arm, Patient Position: Sitting, Cuff Size: Normal)   Pulse 76   Ht 5\' 4"  (1.626 m)   Wt 183 lb 6.4 oz (83.2 kg)   LMP 03/10/2018   Breastfeeding? Yes   BMI 31.48 kg/m  General:Well developed, well nourished.  No acute distress. Abdomen: Bowel sounds normal, soft, non-tender.  Umbilical and suprapubic incision well-healed Pelvic Exam:    Pelvic deferred l  Incision(s):   Healing well, no drainage, no erythema, no hernia, no swelling, no dehiscence,     Assessment:  Post-Op 1 weeks s/p Tubal ligation, hernia repair    Full release to routine activity postoperatively.   Plan:  1.Wound care discussed   2. . current medications.  None  3. Activity restrictions: none 4. return to work: not applicable. 5. Follow up in 3 Years.

## 2019-01-26 DIAGNOSIS — Z6832 Body mass index (BMI) 32.0-32.9, adult: Secondary | ICD-10-CM | POA: Diagnosis not present

## 2019-01-26 DIAGNOSIS — N200 Calculus of kidney: Secondary | ICD-10-CM | POA: Diagnosis not present

## 2019-01-26 DIAGNOSIS — R319 Hematuria, unspecified: Secondary | ICD-10-CM | POA: Diagnosis not present

## 2019-01-26 DIAGNOSIS — E6609 Other obesity due to excess calories: Secondary | ICD-10-CM | POA: Diagnosis not present

## 2019-01-26 DIAGNOSIS — Z1389 Encounter for screening for other disorder: Secondary | ICD-10-CM | POA: Diagnosis not present

## 2019-02-05 ENCOUNTER — Emergency Department (HOSPITAL_COMMUNITY): Payer: Medicaid Other

## 2019-02-05 ENCOUNTER — Encounter (HOSPITAL_COMMUNITY): Payer: Self-pay

## 2019-02-05 ENCOUNTER — Emergency Department (HOSPITAL_COMMUNITY)
Admission: EM | Admit: 2019-02-05 | Discharge: 2019-02-05 | Disposition: A | Discharge status: Home / Self Care | Payer: Medicaid Other | Attending: Emergency Medicine | Admitting: Emergency Medicine

## 2019-02-05 ENCOUNTER — Other Ambulatory Visit: Payer: Self-pay

## 2019-02-05 DIAGNOSIS — R109 Unspecified abdominal pain: Secondary | ICD-10-CM

## 2019-02-05 DIAGNOSIS — R1032 Left lower quadrant pain: Secondary | ICD-10-CM | POA: Diagnosis not present

## 2019-02-05 DIAGNOSIS — R112 Nausea with vomiting, unspecified: Secondary | ICD-10-CM | POA: Diagnosis not present

## 2019-02-05 DIAGNOSIS — N201 Calculus of ureter: Secondary | ICD-10-CM | POA: Diagnosis not present

## 2019-02-05 LAB — URINALYSIS, ROUTINE W REFLEX MICROSCOPIC
Bacteria, UA: NONE SEEN
Bilirubin Urine: NEGATIVE
Glucose, UA: NEGATIVE mg/dL
Ketones, ur: NEGATIVE mg/dL
Leukocytes,Ua: NEGATIVE
Nitrite: NEGATIVE
Protein, ur: NEGATIVE mg/dL
Specific Gravity, Urine: 1.017 (ref 1.005–1.030)
pH: 5 (ref 5.0–8.0)

## 2019-02-05 LAB — BASIC METABOLIC PANEL
Anion gap: 7 (ref 5–15)
BUN: 13 mg/dL (ref 6–20)
CO2: 24 mmol/L (ref 22–32)
Calcium: 8.8 mg/dL — ABNORMAL LOW (ref 8.9–10.3)
Chloride: 106 mmol/L (ref 98–111)
Creatinine, Ser: 0.63 mg/dL (ref 0.44–1.00)
GFR calc Af Amer: >60 mL/min (ref >60)
GFR calc non Af Amer: >60 mL/min (ref >60)
Glucose, Bld: 96 mg/dL (ref 70–99)
Potassium: 3.4 mmol/L — ABNORMAL LOW (ref 3.5–5.1)
Sodium: 137 mmol/L (ref 135–145)

## 2019-02-05 LAB — I-STAT BETA HCG BLOOD, ED (MC, WL, AP ONLY): I-stat hCG, quantitative: <5 m[IU]/mL (ref <5)

## 2019-02-05 LAB — CBC
HCT: 38.6 % (ref 36.0–46.0)
Hemoglobin: 13.1 g/dL (ref 12.0–15.0)
MCH: 30.7 pg (ref 26.0–34.0)
MCHC: 33.9 g/dL (ref 30.0–36.0)
MCV: 90.4 fL (ref 80.0–100.0)
Platelets: 278 10*3/uL (ref 150–400)
RBC: 4.27 MIL/uL (ref 3.87–5.11)
RDW: 13.1 % (ref 11.5–15.5)
WBC: 6.3 10*3/uL (ref 4.0–10.5)
nRBC: 0 % (ref 0.0–0.2)

## 2019-02-05 MED ORDER — ONDANSETRON HCL 4 MG/2ML IJ SOLN
4.0000 mg | Freq: Once | INTRAMUSCULAR | Status: AC
  Administered 2019-02-05: 4 mg via INTRAVENOUS
  Filled 2019-02-05: qty 2

## 2019-02-05 MED ORDER — HYDROMORPHONE HCL 1 MG/ML IJ SOLN
0.5000 mg | Freq: Once | INTRAMUSCULAR | Status: AC
  Administered 2019-02-05: 0.5 mg via INTRAVENOUS
  Filled 2019-02-05: qty 1

## 2019-02-05 MED ORDER — SODIUM CHLORIDE 0.9 % IV BOLUS
500.0000 mL | Freq: Once | INTRAVENOUS | Status: AC
Start: 2019-02-05 — End: 2019-02-05
  Administered 2019-02-05: 500 mL via INTRAVENOUS

## 2019-02-05 MED ORDER — HYDROCODONE-ACETAMINOPHEN 5-325 MG PO TABS: 1.0000 | ORAL_TABLET | Freq: Four times a day (QID) | ORAL | 0 refills | Status: DC | PRN

## 2019-02-05 MED ORDER — ONDANSETRON 4 MG PO TBDP: 4.0000 mg | ORAL_TABLET | Freq: Three times a day (TID) | ORAL | 1 refills | Status: DC | PRN

## 2019-02-05 MED ORDER — SODIUM CHLORIDE 0.9 % IV SOLN: INTRAVENOUS | Status: DC

## 2019-02-05 MED ORDER — IBUPROFEN 800 MG PO TABS: 800.0000 mg | ORAL_TABLET | Freq: Three times a day (TID) | ORAL | 0 refills | Status: DC

## 2019-02-05 NOTE — ED Triage Notes (Signed)
Pt reports left flank pain that began this morning after lifting laundry basket. Pain radiates to left abdomen. Pt has hx of kidney stones

## 2019-02-05 NOTE — ED Provider Notes (Signed)
[facility] EMERGENCY DEPARTMENT Provider Note   CSN: [id number] Arrival date & time: 02/05/19  1250    History   Chief Complaint Chief Complaint  Patient presents with  . Flank Pain    HPI Jessica Ward is a 28 y.o. female.     Patient with acute onset left flank pain radiating towards the left lower quadrant about an hour and a half prior to arrival.  Associated with nausea and one episode of vomiting.  Patient states she has had a history of kidney stones in the past.  Review of CT scan showed that she has had a left-sided stone in her kidney.  Patient denies any blood in her urine.  Patient's had a tubal ligation.     Past Medical History:  Diagnosis Date  . High cholesterol   . Miscarriage   . Normal delivery 10/27/2012    Patient Active Problem List   Diagnosis Date Noted  . Umbilical hernia reducible 03/23/2018  . Flu 11/30/2017  . Supervision of normal pregnancy 07/01/2017  . Spontaneous abortion without complication 12/22/2016  . Rubella non-immune status, antepartum 02/05/2015  . Sterilization consult 08/22/2014    Past Surgical History:  Procedure Laterality Date  . NO PAST SURGERIES    . TUBAL LIGATION Bilateral 03/23/2018   Procedure: BILATERAL TUBAL LIGATION WITH FALLOPE RINGS;  Surgeon: Tilda Burrow, MD;  Location: [facility];  Service: Gynecology;  Laterality: Bilateral;  . UMBILICAL HERNIA REPAIR N/A 03/23/2018   Procedure: HERNIA REPAIR UMBILICAL ADULT;  Surgeon: Tilda Burrow, MD;  Location: [facility];  Service: Gynecology;  Laterality: N/A;     OB History    Gravida  5   Para  4   Term  4   Preterm      AB  1   Living  4     SAB  1   TAB      Ectopic      Multiple  0   Live Births  4            Home Medications    Prior to Admission medications   Medication Sig Start Date End Date Taking? Authorizing Provider  acetaminophen (TYLENOL) 500 MG tablet Take 1,000 mg by mouth every 6 (six) hours as needed (for  pain/headaches.).    [provider]    Family History Family History  Problem Relation Age of Onset  . Cancer Other        leukemia, breast-maternal great grandma  . Hypertension Mother   . Diabetes Maternal Grandfather   . Coronary artery disease Maternal Grandfather   . Heart attack Maternal Grandfather   . Stroke Maternal Grandfather   . Hyperlipidemia Father   . Heart disease Father        MI 12/2014  . Heart attack Father   . Other Daughter        tubes in ears  . Other Daughter        tubes in ears  . Hypercholesterolemia Paternal Aunt     Social History Social History   Tobacco Use  . Smoking status: Never Smoker  . Smokeless tobacco: Never Used  Substance Use Topics  . Alcohol use: No  . Drug use: No     Allergies   Patient has no known allergies.   Review of Systems Review of Systems  Constitutional: Negative for chills and fever.  HENT: Negative for rhinorrhea and sore throat.   Eyes: Negative for visual disturbance.  Respiratory:  Negative for cough and shortness of breath.   Cardiovascular: Negative for chest pain and leg swelling.  Gastrointestinal: Positive for nausea and vomiting. Negative for abdominal pain and diarrhea.  Genitourinary: Positive for flank pain. Negative for dysuria and hematuria.  Musculoskeletal: Negative for back pain and neck pain.  Skin: Negative for rash.  Neurological: Negative for dizziness, light-headedness and headaches.  Hematological: Does not bruise/bleed easily.  Psychiatric/Behavioral: Negative for confusion.     Physical Exam Updated Vital Signs BP 120/67 (BP Location: Right Arm)   Pulse 85   Temp 98 F (36.7 C) (Oral)   Resp 18   Ht 1.626 m ( )   Wt 90.7 kg   SpO2 99%   BMI 34.33 kg/m   Physical Exam Vitals signs and nursing note reviewed.  Constitutional:      General: She is not in acute distress.    Appearance: She is well-developed.  HENT:     Head: Normocephalic and  atraumatic.  Eyes:     Extraocular Movements: Extraocular movements intact.     Conjunctiva/sclera: Conjunctivae normal.     Pupils: Pupils are equal, round, and reactive to light.  Neck:     Musculoskeletal: Normal range of motion and neck supple.  Cardiovascular:     Rate and Rhythm: Normal rate and regular rhythm.     Heart sounds: No murmur.  Pulmonary:     Effort: Pulmonary effort is normal. No respiratory distress.     Breath sounds: Normal breath sounds.  Abdominal:     Palpations: Abdomen is soft.     Tenderness: There is abdominal tenderness.     Comments: Slight tenderness left lower quadrant.  No guarding.  Musculoskeletal: Normal range of motion.  Skin:    General: Skin is warm and dry.  Neurological:     General: No focal deficit present.     Mental Status: She is alert and oriented to person, place, and time.      ED Treatments / Results  Labs (all labs ordered are listed, but only abnormal results are displayed) Labs Reviewed  URINALYSIS, ROUTINE W REFLEX MICROSCOPIC  CBC  BASIC METABOLIC PANEL  HCG, SERUM, QUALITATIVE    EKG None  Radiology No results found.  Procedures Procedures (including critical care time)  Medications Ordered in ED Medications  0.9 %  sodium chloride infusion (has no administration in time range)  sodium chloride 0.9 % bolus 500 mL (has no administration in time range)  ondansetron (ZOFRAN) injection 4 mg (has no administration in time range)  HYDROmorphone (DILAUDID) injection 0.5 mg (has no administration in time range)     Initial Impression / Assessment and Plan / ED Course  I have reviewed the triage vital signs and the nursing notes.  Pertinent labs & imaging results that were available during my care of the patient were reviewed by me and considered in my medical decision making (see chart for details).        Patient suggestive of possible renal colic.  With the left flank pain.  Will get CT renal study.   Will give some IV fluids check basic labs will get pregnancy test just to be complete.  Patient will receive some hydromorphone and Zofran.  Patient's renal function is normal.  Patient improved here with pain medicine.  Patient not pregnant.  However patient with a very large left renal stone at the [facility].  Causing some moderate to severe left hydronephrosis.  Right side normal.  Patient will be  given treatment with pain medicine and antinausea medicine and Motrin.  And referral to urology.  She will call on Monday to follow-up with urology should return for any new or worse symptoms.  Based on the size of the stone patient may have difficulty passing this.  Is measuring 5 x 10 mm.    Final Clinical Impressions(s) / ED Diagnoses   Final diagnoses:  Left flank pain    ED Discharge Orders    None       Vanetta MuldersZackowski, Mont Jagoda, MD 02/05/19 1500

## 2019-02-05 NOTE — ED Notes (Signed)
Patient transported to CT 

## 2019-02-05 NOTE — Discharge Instructions (Signed)
Take Motrin take the pain medicine and the antinausea medicine as needed.  Take the Motrin on a regular basis.  Call urology on Monday for follow-up.  This is a large stone you may have difficulty passing it.  Return for any new or worse symptoms over the weekend to include fever persistent vomiting.  Follow-up with urology is very important.

## 2019-02-05 NOTE — ED Notes (Signed)
Pt with left flank pain, hx of kidney stones.  Pt denies burning with urination.

## 2019-03-18 ENCOUNTER — Telehealth: Payer: Self-pay | Admitting: Women's Health

## 2019-03-18 NOTE — Telephone Encounter (Signed)
Pt requesting an appt for vaginal itching before her menstrual cycle begins each month.

## 2019-03-21 ENCOUNTER — Telehealth: Payer: Self-pay | Admitting: *Deleted

## 2019-03-24 NOTE — Telephone Encounter (Signed)
Erroneous encounter

## 2019-03-29 ENCOUNTER — Ambulatory Visit (INDEPENDENT_AMBULATORY_CARE_PROVIDER_SITE_OTHER): Payer: Self-pay | Admitting: Adult Health

## 2019-03-29 ENCOUNTER — Other Ambulatory Visit: Payer: Self-pay

## 2019-03-29 ENCOUNTER — Encounter: Payer: Self-pay | Admitting: Adult Health

## 2019-03-29 VITALS — BP 106/65 | HR 81 | Ht 64.0 in | Wt 189.6 lb

## 2019-03-29 DIAGNOSIS — Z113 Encounter for screening for infections with a predominantly sexual mode of transmission: Secondary | ICD-10-CM

## 2019-03-29 DIAGNOSIS — N941 Unspecified dyspareunia: Secondary | ICD-10-CM

## 2019-03-29 DIAGNOSIS — N898 Other specified noninflammatory disorders of vagina: Secondary | ICD-10-CM

## 2019-03-29 MED ORDER — FLUCONAZOLE 150 MG PO TABS: ORAL_TABLET | ORAL | 6 refills | Status: DC

## 2019-03-29 NOTE — Progress Notes (Signed)
Patient ID: Jessica Ward, female   DOB: 01-07-1991, 28 y.o.   MRN: [id number] History of Present Illness: Jessica Ward is a 28 year old white female, married, [id number], in complaining of vaginal itching every month before period, esp near clitoris area. PCP is Dr Hilma Favors.    Current Medications, Allergies, Past Medical History, Past Surgical History, Family History and Social History were reviewed in [employer] record.     Review of Systems: +vaginal itching now and every month before period +Pain with sex at times, esp left side     Physical Exam:BP 106/65 (BP Location: Left Arm, Patient Position: Sitting, Cuff Size: Normal)   Pulse 81   Ht 5\' 4"  (1.626 m)   Wt 189 lb 9.6 oz (86 kg)   LMP 03/21/2019   Breastfeeding No   BMI 32.54 kg/m  General:  Well developed, well nourished, no acute distress Skin:  Warm and dry Pelvic:  External genitalia is normal in appearance, no lesions.  The vagina is normal in appearance. Urethra has no lesions or masses. The cervix is bulbous.  Uterus is felt to be normal size, shape, and contour.  No adnexal masses,+ tenderness noted, LLQ.Marland KitchenBladder is non tender, no masses felt.Nuswab obtained.  Psych:  No mood changes, alert and cooperative,seems happy Examination chaperoned by Estill Bamberg Rash LPN.  Impression and Plan: 1. Vaginal itching -Nuswab sent -will rx diflucan before period every month Meds ordered this encounter  Medications  . fluconazole (DIFLUCAN) 150 MG tablet    Sig: Take 1 now and then 1 every month before period    Dispense:  2 tablet    Refill:  6    Order Specific Question:   Supervising Provider    Answer:   Tania Ade H [[id number]]     2. Screening examination for STD (sexually transmitted disease) -Nuswab sent  3. Dyspareunia in female -use pillows under hips and try new positions  Follow up prn

## 2019-04-05 ENCOUNTER — Telehealth: Payer: Self-pay | Admitting: Adult Health

## 2019-04-05 LAB — NUSWAB VAGINITIS PLUS (VG+)
Candida albicans, NAA: NEGATIVE
Candida glabrata, NAA: NEGATIVE
Chlamydia trachomatis, NAA: NEGATIVE
Neisseria gonorrhoeae, NAA: NEGATIVE
Trich vag by NAA: NEGATIVE

## 2019-04-05 NOTE — Telephone Encounter (Signed)
Pt says itching was better after taking diflucan and she is aware that nuswab was negative

## 2019-05-24 ENCOUNTER — Other Ambulatory Visit: Payer: Self-pay

## 2019-05-24 ENCOUNTER — Ambulatory Visit
Admission: EM | Admit: 2019-05-24 | Discharge: 2019-05-24 | Disposition: A | Discharge status: Home / Self Care | Payer: Medicaid Other | Attending: Emergency Medicine | Admitting: Emergency Medicine

## 2019-05-24 DIAGNOSIS — H9202 Otalgia, left ear: Secondary | ICD-10-CM

## 2019-05-24 DIAGNOSIS — H60392 Other infective otitis externa, left ear: Secondary | ICD-10-CM

## 2019-05-24 MED ORDER — HYDROCORTISONE-ACETIC ACID 1-2 % OT SOLN: 5.0000 [drp] | Freq: Three times a day (TID) | OTIC | 0 refills | Status: AC

## 2019-05-24 NOTE — ED Triage Notes (Signed)
C/o left ear pain that began last night

## 2019-05-24 NOTE — ED Provider Notes (Signed)
[place]   [id number] 05/24/19 Arrival Time: 1062  CC: EAR PAIN  SUBJECTIVE: History from: patient.  Jessica Ward is a 28 y.o. female who presents with left ear pain that began last night.  Denies a precipitating event, such as swimming or wearing ear plugs.  Patient states the pain is intermittent and achy in character.  Patient has NOT tried OTC medications.  Symptoms are made worse with talking on the phone.  Reports similar symptoms in the past and diagnosed with ear infection.  Complains of muffled hearing in left ear.  Denies fever, chills, fatigue, sinus pain, rhinorrhea, ear discharge, sore throat, SOB, wheezing, chest pain, nausea, changes in bowel or bladder habits.    ROS: As per HPI.  All other pertinent ROS negative.     Past Medical History:  Diagnosis Date  . High cholesterol   . Miscarriage   . Normal delivery 10/27/2012   Past Surgical History:  Procedure Laterality Date  . NO PAST SURGERIES    . TUBAL LIGATION Bilateral 03/23/2018   Procedure: BILATERAL TUBAL LIGATION WITH FALLOPE RINGS;  Surgeon: Jonnie Kind, MD;  Location: [facility];  Service: Gynecology;  Laterality: Bilateral;  . UMBILICAL HERNIA REPAIR N/A 03/23/2018   Procedure: HERNIA REPAIR UMBILICAL ADULT;  Surgeon: Jonnie Kind, MD;  Location: [facility];  Service: Gynecology;  Laterality: N/A;   No Known Allergies No current facility-administered medications on file prior to encounter.    Current Outpatient Medications on File Prior to Encounter  Medication Sig Dispense Refill  . acetaminophen (TYLENOL) 500 MG tablet Take 1,000 mg by mouth every 6 (six) hours as needed (for pain/headaches.).    Marland Kitchen ibuprofen (ADVIL) 800 MG tablet Take 1 tablet (800 mg total) by mouth 3 (three) times daily. 21 tablet 0  . ondansetron (ZOFRAN ODT) 4 MG disintegrating tablet Take 1 tablet (4 mg total) by mouth every 8 (eight) hours as needed. 10 tablet 1   Social History   Socioeconomic History  . Marital  status: Married    Spouse name: Not on file  . Number of children: Not on file  . Years of education: Not on file  . Highest education level: Not on file  Occupational History  . Occupation: Agricultural engineer  Social Needs  . Financial resource strain: Not on file  . Food insecurity    Worry: Not on file    Inability: Not on file  . Transportation needs    Medical: Not on file    Non-medical: Not on file  Tobacco Use  . Smoking status: Never Smoker  . Smokeless tobacco: Never Used  Substance and Sexual Activity  . Alcohol use: No  . Drug use: No  . Sexual activity: Yes    Birth control/protection: Surgical  Lifestyle  . Physical activity    Days per week: Not on file    Minutes per session: Not on file  . Stress: Not on file  Relationships  . Social Herbalist on phone: Not on file    Gets together: Not on file    Attends religious service: Not on file    Active member of club or organization: Not on file    Attends meetings of clubs or organizations: Not on file    Relationship status: Not on file  . Intimate partner violence    Fear of current or ex partner: Not on file    Emotionally abused: Not on file    Physically abused: Not  on file    Forced sexual activity: Not on file  Other Topics Concern  . Not on file  Social History Narrative   Patient lives with her husband, 2 daughters, and 1 step-daughter. She is a stay-at-home mom, husband works in Event organisergrating/excavating running heavy equipment. Husband is a smoker.   Family History  Problem Relation Age of Onset  . Cancer Other        leukemia, breast-maternal great grandma  . Hypertension Mother   . Diabetes Maternal Grandfather   . Coronary artery disease Maternal Grandfather   . Heart attack Maternal Grandfather   . Stroke Maternal Grandfather   . Hyperlipidemia Father   . Heart disease Father        MI 12/2014  . Heart attack Father   . Other Daughter        tubes in ears  . Other Daughter         tubes in ears  . Hypercholesterolemia Paternal Aunt     OBJECTIVE:  Vitals:   05/24/19 1054  BP: 116/74  Pulse: 86  Resp: 18  Temp: 98.2 F (36.8 C)  SpO2: 96%     General appearance: alert; well-appearing; nontoxic HEENT: Ears: RT EAC clear, LT EAC without obvious exudate, TMs pearly gray with visible cone of light, without erythema, mild tenderness with tragal manipulation of RT ear, no mastoid tenderness or erythema; Eyes: PERRL, EOMI grossly; Sinuses nontender to palpation; Nose: clear rhinorrhea; Throat: oropharynx clear, tonsils not enlarged or erythematous, no white tonsillar exudates, uvula midline Neck: supple without LAD Lungs: unlabored respirations, symmetrical air entry; cough: absent; no respiratory distress Heart: regular rate and rhythm.  Skin: warm and dry Psychological: alert and cooperative; normal mood and affect   ASSESSMENT & PLAN:  1. Otalgia of left ear   2. Infective otitis externa of left ear     Meds ordered this encounter  Medications  . acetic acid-hydrocortisone (VOSOL-HC) OTIC solution    Sig: Place 5 drops into the left ear 3 (three) times daily for 7 days.    Dispense:  10 mL    Refill:  0    Order Specific Question:   Supervising Provider    Answer:   Eustace MooreELSON, YVONNE SUE [[id number]]    Rest and drink plenty of fluids Ear drops prescribed.  Use as directed and to completion Use OTC ibuprofen and/ or tylenol as needed for pain control Follow up with PCP if symptoms persists Return here or go to the ER if you have any new or worsening symptoms fever, chills, nausea, vomiting, chest pain, shortness of breath, cough, changes in bowel or bladder function, etc...  Reviewed expectations re: course of current medical issues. Questions answered. Outlined signs and symptoms indicating need for more acute intervention. Patient verbalized understanding. After Visit Summary given.         Rennis HardingWurst, Erwin Nishiyama, PA-C 05/24/19 1123

## 2019-05-24 NOTE — Discharge Instructions (Signed)
Rest and drink plenty of fluids Ear drops prescribed.  Use as directed and to completion Use OTC ibuprofen and/ or tylenol as needed for pain control Follow up with PCP if symptoms persists Return here or go to the ER if you have any new or worsening symptoms fever, chills, nausea, vomiting, chest pain, shortness of breath, cough, changes in bowel or bladder function, etc..Marland Kitchen

## 2019-10-27 ENCOUNTER — Other Ambulatory Visit: Payer: Self-pay

## 2019-10-27 ENCOUNTER — Ambulatory Visit (INDEPENDENT_AMBULATORY_CARE_PROVIDER_SITE_OTHER): Payer: Medicaid Other

## 2019-10-27 ENCOUNTER — Ambulatory Visit
Admission: EM | Admit: 2019-10-27 | Discharge: 2019-10-27 | Disposition: A | Discharge status: Home / Self Care | Payer: Medicaid Other | Attending: Emergency Medicine | Admitting: Emergency Medicine

## 2019-10-27 DIAGNOSIS — R2242 Localized swelling, mass and lump, left lower limb: Secondary | ICD-10-CM | POA: Diagnosis not present

## 2019-10-27 DIAGNOSIS — M25572 Pain in left ankle and joints of left foot: Secondary | ICD-10-CM

## 2019-10-27 DIAGNOSIS — S93402A Sprain of unspecified ligament of left ankle, initial encounter: Secondary | ICD-10-CM | POA: Diagnosis not present

## 2019-10-27 MED ORDER — IBUPROFEN 800 MG PO TABS: 800.0000 mg | ORAL_TABLET | Freq: Three times a day (TID) | ORAL | 0 refills | Status: DC

## 2019-10-27 NOTE — ED Provider Notes (Signed)
[facility]    CSN: [id number] Arrival date & time: 10/27/19  1412      History   Chief Complaint Chief Complaint  Patient presents with  . Ankle Injury    left    HPI Chestine Belknap is a 29 y.o. female.   Batsheva Stevick 29 years old female presented to the urgent care with a complaint of left ankle pain that began 1 to 2 hours ago.  It develop after having a fall at a local urgent care office.  Reports she was trying to get down from a hospital chair and mistakenly twisted her ankle and fell backward.  She localizes the pain to the left ankle.  She describes the pain as constant and achy and rated it at 7 on a scale of 1-10. Pain is at 10 on a range of motion.  Reports she has not tried any medication.  Her symptoms are made worse with movement .  She denies similar symptoms in the past.    The history is provided by the patient. No language interpreter was used.    Past Medical History:  Diagnosis Date  . High cholesterol   . Miscarriage   . Normal delivery 10/27/2012    Patient Active Problem List   Diagnosis Date Noted  . Dyspareunia in female 03/29/2019  . Screening examination for STD (sexually transmitted disease) 03/29/2019  . Vaginal itching 03/29/2019  . Umbilical hernia reducible 03/23/2018  . Flu 11/30/2017  . Supervision of normal pregnancy 07/01/2017  . Spontaneous abortion without complication 12/22/2016  . Rubella non-immune status, antepartum 02/05/2015  . Sterilization consult 08/22/2014    Past Surgical History:  Procedure Laterality Date  . NO PAST SURGERIES    . TUBAL LIGATION Bilateral 03/23/2018   Procedure: BILATERAL TUBAL LIGATION WITH FALLOPE RINGS;  Surgeon: Tilda Burrow, MD;  Location: [facility];  Service: Gynecology;  Laterality: Bilateral;  . UMBILICAL HERNIA REPAIR N/A 03/23/2018   Procedure: HERNIA REPAIR UMBILICAL ADULT;  Surgeon: Tilda Burrow, MD;  Location: [facility];  Service: Gynecology;  Laterality: N/A;    OB History      Gravida  5   Para  4   Term  4   Preterm      AB  1   Living  4     SAB  1   TAB      Ectopic      Multiple  0   Live Births  4            Home Medications    Prior to Admission medications   Medication Sig Start Date End Date Taking? Authorizing Provider  acetaminophen (TYLENOL) 500 MG tablet Take 1,000 mg by mouth every 6 (six) hours as needed (for pain/headaches.).    [provider]  ibuprofen (ADVIL) 800 MG tablet Take 1 tablet (800 mg total) by mouth 3 (three) times daily. 10/27/19   Joesphine Schemm, Zachery Dakins, FNP  ondansetron (ZOFRAN ODT) 4 MG disintegrating tablet Take 1 tablet (4 mg total) by mouth every 8 (eight) hours as needed. 02/05/19   Vanetta Mulders, MD    Family History Family History  Problem Relation Age of Onset  . Cancer Other        leukemia, breast-maternal great grandma  . Hypertension Mother   . Diabetes Maternal Grandfather   . Coronary artery disease Maternal Grandfather   . Heart attack Maternal Grandfather   . Stroke Maternal Grandfather   . Hyperlipidemia Father   .  Heart disease Father        MI 12/2014  . Heart attack Father   . Other Daughter        tubes in ears  . Other Daughter        tubes in ears  . Hypercholesterolemia Paternal Aunt     Social History Social History   Tobacco Use  . Smoking status: Never Smoker  . Smokeless tobacco: Never Used  Substance Use Topics  . Alcohol use: No  . Drug use: No     Allergies   Patient has no known allergies.   Review of Systems Review of Systems  Constitutional: Negative.   Respiratory: Negative.   Cardiovascular: Negative.   Musculoskeletal: Positive for joint swelling.       Ankle pain  ROS: All other negative  Physical Exam Triage Vital Signs ED Triage Vitals  Enc Vitals Group     BP      Pulse      Resp      Temp      Temp src      SpO2      Weight      Height      Head Circumference      Peak Flow      Pain Score      Pain Loc       Pain Edu?      Excl. in GC?    No data found.  Updated Vital Signs BP 118/75 (BP Location: Right Arm)   Pulse 97   Temp 98.5 F (36.9 C) (Oral)   Resp 16   LMP 10/13/2019 (Approximate)   SpO2 98%   Visual Acuity Right Eye Distance:   Left Eye Distance:   Bilateral Distance:    Right Eye Near:   Left Eye Near:    Bilateral Near:     Physical Exam Constitutional:      General: She is not in acute distress.    Appearance: Normal appearance. She is normal weight. She is not ill-appearing or toxic-appearing.  Cardiovascular:     Rate and Rhythm: Normal rate and regular rhythm.     Pulses: Normal pulses.     Heart sounds: Normal heart sounds. No murmur.  Pulmonary:     Effort: Pulmonary effort is normal. No respiratory distress.     Breath sounds: Normal breath sounds. No rhonchi.  Chest:     Chest wall: No tenderness.  Musculoskeletal:        General: Swelling, tenderness and signs of injury present.     Right ankle: Normal.     Right Achilles Tendon: Normal.     Left ankle: Swelling present. No deformity, ecchymosis or lacerations. Tenderness present. Decreased range of motion.     Left Achilles Tendon: Normal.     Comments: The patient is unable to bear weight on the left ankle.  The left ankle without obvious asymmetry or deformity when compared to the right ankle.  Patient is unable to flex, extend, invert and evert the left ankle.  No obvious surface trauma.  Neurological:     Mental Status: She is alert.      UC Treatments / Results  Labs (all labs ordered are listed, but only abnormal results are displayed) Labs Reviewed - No data to display  EKG   Radiology DG Ankle Complete Left  Result Date: 10/27/2019 CLINICAL DATA:  Fall today with twisting left ankle injury. EXAM: LEFT ANKLE COMPLETE - 3+ VIEW COMPARISON:  02/16/2012 FINDINGS: No evidence of acute fracture or dislocation. Mild diffuse soft tissue swelling is present. IMPRESSION: No acute fracture.  Electronically Signed   By: Marin Olp M.D.   On: 10/27/2019 14:49    Procedures Procedures (including critical care time)  Medications Ordered in UC Medications - No data to display  Initial Impression / Assessment and Plan / UC Course  I have reviewed the triage vital signs and the nursing notes.  Pertinent labs & imaging results that were available during my care of the patient were reviewed by me and considered in my medical decision making (see chart for details).    Left ankle x-ray was ordered and result was reviewed.  Results showed no acute fracture.  Ankle splint was applied in office. Patient is stable and in no acute distress.  Advised patient to follow-up with primary care for possible referral to orthopedic.  To go to ED or return for worsening of symptoms.  Patient verbalized understanding of the plan of care.  Final Clinical Impressions(s) / UC Diagnoses   Final diagnoses:  Acute left ankle pain     Discharge Instructions     Your left ankle x-ray was negative for fracture Advised patient to follow-up with primary care  Advised patient to take ibuprofen as prescribed with food To fuse RICE instructions as attached To return for worsening of symptoms or go to ED    ED Prescriptions    Medication Sig Dispense Auth. Provider   ibuprofen (ADVIL) 800 MG tablet  (Status: Discontinued) Take 1 tablet (800 mg total) by mouth 3 (three) times daily. 42 tablet Logen Heintzelman S, FNP   ibuprofen (ADVIL) 800 MG tablet Take 1 tablet (800 mg total) by mouth 3 (three) times daily. 21 tablet Oluwatomisin Hustead, Darrelyn Hillock, FNP     PDMP not reviewed this encounter.   Emerson Monte, Orwin 10/27/19 [phone]

## 2019-10-27 NOTE — ED Triage Notes (Addendum)
Pt presents to UC w/ c/o left ankle injury a few minutes ago. Pt states she was getting down from a high chair and twisted her left ankle inward. Full ROM present. Left ankle swollen, no bruising noted. Pt reports pain shooting up front of leg to knee.

## 2019-10-27 NOTE — Discharge Instructions (Addendum)
Your left ankle x-ray was negative for fracture Advised patient to follow-up with primary care  Advised patient to take ibuprofen as prescribed with food To fuse RICE instructions as attached To return for worsening of symptoms or go to ED

## 2019-10-30 ENCOUNTER — Emergency Department (HOSPITAL_COMMUNITY): Payer: Medicaid Other

## 2019-10-30 ENCOUNTER — Emergency Department (HOSPITAL_COMMUNITY)
Admission: EM | Admit: 2019-10-30 | Discharge: 2019-10-30 | Disposition: A | Discharge status: Home / Self Care | Payer: Medicaid Other | Attending: Emergency Medicine | Admitting: Emergency Medicine

## 2019-10-30 ENCOUNTER — Other Ambulatory Visit: Payer: Self-pay

## 2019-10-30 ENCOUNTER — Encounter (HOSPITAL_COMMUNITY): Payer: Self-pay

## 2019-10-30 DIAGNOSIS — R519 Headache, unspecified: Secondary | ICD-10-CM | POA: Diagnosis present

## 2019-10-30 DIAGNOSIS — R0602 Shortness of breath: Secondary | ICD-10-CM | POA: Diagnosis not present

## 2019-10-30 DIAGNOSIS — B349 Viral infection, unspecified: Secondary | ICD-10-CM | POA: Diagnosis not present

## 2019-10-30 DIAGNOSIS — R0789 Other chest pain: Secondary | ICD-10-CM | POA: Diagnosis not present

## 2019-10-30 DIAGNOSIS — Z20822 Contact with and (suspected) exposure to covid-19: Secondary | ICD-10-CM | POA: Insufficient documentation

## 2019-10-30 MED ORDER — ACETAMINOPHEN 500 MG PO TABS
1000.0000 mg | ORAL_TABLET | Freq: Once | ORAL | Status: AC
  Administered 2019-10-30: 10:00:00 1000 mg via ORAL
  Filled 2019-10-30: qty 2

## 2019-10-30 MED ORDER — METOCLOPRAMIDE HCL 5 MG/ML IJ SOLN
10.0000 mg | INTRAMUSCULAR | Status: AC
  Administered 2019-10-30: 10 mg via INTRAVENOUS
  Filled 2019-10-30: qty 2

## 2019-10-30 MED ORDER — KETOROLAC TROMETHAMINE 30 MG/ML IJ SOLN
15.0000 mg | Freq: Once | INTRAMUSCULAR | Status: AC
  Administered 2019-10-30: 09:00:00 15 mg via INTRAVENOUS
  Filled 2019-10-30: qty 1

## 2019-10-30 MED ORDER — ONDANSETRON 4 MG PO TBDP: 4.0000 mg | ORAL_TABLET | Freq: Three times a day (TID) | ORAL | 0 refills | Status: DC | PRN

## 2019-10-30 MED ORDER — SODIUM CHLORIDE 0.9 % IV BOLUS
500.0000 mL | Freq: Once | INTRAVENOUS | Status: AC
  Administered 2019-10-30: 09:00:00 500 mL via INTRAVENOUS

## 2019-10-30 NOTE — ED Provider Notes (Signed)
ED ECG REPORT   Date: 10/30/2019  Rate: 99  Rhythm: normal sinus rhythm  QRS Axis: normal  Intervals: normal  ST/T Wave abnormalities: normal  Conduction Disutrbances:none  Narrative Interpretation:   Old EKG Reviewed: none available  I have personally reviewed the EKG tracing and agree with the computerized printout as noted.   Baseline wandering in leads II, III and V5.   Vanetta Mulders, MD 10/30/19 [phone]

## 2019-10-30 NOTE — ED Provider Notes (Signed)
[facility] EMERGENCY DEPARTMENT Provider Note   CSN: [id number] Arrival date & time: 10/30/19  5053     History Chief Complaint  Patient presents with  . Generalized Body Aches    Jessica Ward is a 29 y.o. female.   29 year old female presents to the emergency department for evaluation of upper respiratory symptoms x2 days.  Symptoms constant since onset. She states that she awoke on Friday with a global headache, pressure behind her eyes with chills. She has begun to notice a burning discomfort in her central chest with some chest tightness and cough. Continues to report anorexia and nausea. Has been alternating Tylenol and ibuprofen for symptoms. Denies known fever, travel, vomiting, sore throat, SOB or DOE. No known COVID+ exposure. States son at home woke up this AM complaining of similar symptoms.  The history is provided by the patient. No language interpreter was used.      Past Medical History:  Diagnosis Date  . High cholesterol   . Miscarriage   . Normal delivery 10/27/2012    Patient Active Problem List   Diagnosis Date Noted  . Dyspareunia in female 03/29/2019  . Screening examination for STD (sexually transmitted disease) 03/29/2019  . Vaginal itching 03/29/2019  . Umbilical hernia reducible 03/23/2018  . Flu 11/30/2017  . Supervision of normal pregnancy 07/01/2017  . Spontaneous abortion without complication 97/67/3419  . Rubella non-immune status, antepartum 02/05/2015  . Sterilization consult 08/22/2014    Past Surgical History:  Procedure Laterality Date  . NO PAST SURGERIES    . TUBAL LIGATION Bilateral 03/23/2018   Procedure: BILATERAL TUBAL LIGATION WITH FALLOPE RINGS;  Surgeon: Jonnie Kind, MD;  Location: [facility];  Service: Gynecology;  Laterality: Bilateral;  . UMBILICAL HERNIA REPAIR N/A 03/23/2018   Procedure: HERNIA REPAIR UMBILICAL ADULT;  Surgeon: Jonnie Kind, MD;  Location: [facility];  Service: Gynecology;  Laterality: N/A;     OB  History    Gravida  5   Para  4   Term  4   Preterm      AB  1   Living  4     SAB  1   TAB      Ectopic      Multiple  0   Live Births  4           Family History  Problem Relation Age of Onset  . Cancer Other        leukemia, breast-maternal great grandma  . Hypertension Mother   . Diabetes Maternal Grandfather   . Coronary artery disease Maternal Grandfather   . Heart attack Maternal Grandfather   . Stroke Maternal Grandfather   . Hyperlipidemia Father   . Heart disease Father        MI 12/2014  . Heart attack Father   . Other Daughter        tubes in ears  . Other Daughter        tubes in ears  . Hypercholesterolemia Paternal Aunt     Social History   Tobacco Use  . Smoking status: Never Smoker  . Smokeless tobacco: Never Used  Substance Use Topics  . Alcohol use: No  . Drug use: No    Home Medications Prior to Admission medications   Medication Sig Start Date End Date Taking? Authorizing Provider  acetaminophen (TYLENOL) 500 MG tablet Take 1,000 mg by mouth every 6 (six) hours as needed (for pain/headaches.).    [provider]  ibuprofen (  ADVIL) 800 MG tablet Take 1 tablet (800 mg total) by mouth 3 (three) times daily. 10/27/19   Avegno, Zachery Dakins, FNP  ondansetron (ZOFRAN ODT) 4 MG disintegrating tablet Take 1 tablet (4 mg total) by mouth every 8 (eight) hours as needed for nausea or vomiting. 10/30/19   Antony Madura, PA-C    Allergies    Patient has no known allergies.  Review of Systems   Review of Systems  Ten systems reviewed and are negative for acute change, except as noted in the HPI.    Physical Exam Updated Vital Signs BP 113/77 (BP Location: Left Arm)   Pulse 92   Temp 98.8 F (37.1 C) (Oral)   Resp 16   Ht 5\' 4"  (1.626 m)   Wt 81.6 kg   LMP 10/13/2019 (Approximate)   SpO2 100%   BMI 30.90 kg/m   Physical Exam Vitals and nursing note reviewed.  Constitutional:      General: She is not in acute  distress.    Appearance: She is well-developed. She is not diaphoretic.     Comments: Nontoxic appearing, in NAD  HENT:     Head: Normocephalic and atraumatic.  Eyes:     General: No scleral icterus.    Conjunctiva/sclera: Conjunctivae normal.  Cardiovascular:     Rate and Rhythm: Normal rate and regular rhythm.     Pulses: Normal pulses.     Comments: Intermittently tachycardic up to 105bpm; otherwise NSR Pulmonary:     Effort: Pulmonary effort is normal. No respiratory distress.     Comments: Respirations even and unlabored. SpO2 99-100% on RA. Lungs CTAB. Musculoskeletal:        General: Normal range of motion.     Cervical back: Normal range of motion.  Skin:    General: Skin is warm and dry.     Coloration: Skin is not pale.     Findings: No erythema or rash.  Neurological:     General: No focal deficit present.     Mental Status: She is alert and oriented to person, place, and time.     Coordination: Coordination normal.  Psychiatric:        Behavior: Behavior normal.     ED Results / Procedures / Treatments   Labs (all labs ordered are listed, but only abnormal results are displayed) Labs Reviewed  NOVEL CORONAVIRUS, NAA (HOSP ORDER, SEND-OUT TO REF LAB; TAT 18-24 HRS)    EKG ED ECG REPORT   Date: 10/30/2019  Rate: 99  Rhythm: normal sinus rhythm  QRS Axis: normal  Intervals: normal  ST/T Wave abnormalities: normal  Conduction Disutrbances:none  Narrative Interpretation:   Old EKG Reviewed: none available  I have personally reviewed the EKG tracing and agree with the computerized printout as noted.  Baseline wandering in leads II, III and V5.   Radiology DG Chest Port 1 View  Result Date: 10/30/2019 CLINICAL DATA:  Shortness of breath EXAM: PORTABLE CHEST 1 VIEW COMPARISON:  Chest radiograph dated 07/31/2008 FINDINGS: The heart size and mediastinal contours are within normal limits. Both lungs are clear. The visualized skeletal structures are  unremarkable. IMPRESSION: No active disease. Electronically Signed   By: 09/30/2008 M.D.   On: 10/30/2019 09:32    Procedures Procedures (including critical care time)  Medications Ordered in ED Medications  ketorolac (TORADOL) 30 MG/ML injection 15 mg (15 mg Intravenous Given 10/30/19 0928)  sodium chloride 0.9 % bolus 500 mL (500 mLs Intravenous New Bag/Given 10/30/19 0928)  metoCLOPramide (  REGLAN) injection 10 mg (10 mg Intravenous Given 10/30/19 0927)  acetaminophen (TYLENOL) tablet 1,000 mg (1,000 mg Oral Given 10/30/19 1012)    ED Course  I have reviewed the triage vital signs and the nursing notes.  Pertinent labs & imaging results that were available during my care of the patient were reviewed by me and considered in my medical decision making (see chart for details).   9:40 AM HR in the 80's. Patient with interval symptom improvement. Will add Tylenol. Conveyed results of CXR which show no acute cardiopulmonary abnormality. Patient verbalizes understanding. Plan for d/c when IVF complete.     MDM Rules/Calculators/A&P                       CXR negative for acute infiltrate. Patient's symptoms are consistent with URI, likely viral etiology. COVID test presently pending. Discussed that antibiotics are not indicated for viral infections. Patient will be discharged with symptomatic treatment and instruction to quarantine until COVID results return.  Return precautions discussed and provided. Patient discharged in stable condition with no unaddressed concerns.  Jessica Ward was evaluated in Emergency Department on 10/30/2019 for the symptoms described in the history of present illness. She was evaluated in the context of the global COVID-19 pandemic, which necessitated consideration that the patient might be at risk for infection with the SARS-CoV-2 virus that causes COVID-19. Institutional protocols and algorithms that pertain to the evaluation of patients at risk for COVID-19 are  in a state of rapid change based on information released by regulatory bodies including the CDC and federal and state organizations. These policies and algorithms were followed during the patient's care in the ED.   Final Clinical Impression(s) / ED Diagnoses Final diagnoses:  Viral syndrome  Suspected COVID-19 virus infection    Rx / DC Orders ED Discharge Orders         Ordered    ondansetron (ZOFRAN ODT) 4 MG disintegrating tablet  Every 8 hours PRN     10/30/19 1026           Antony Madura, PA-C 10/30/19 1030    Vanetta Mulders, MD 11/11/19 1511

## 2019-10-30 NOTE — ED Triage Notes (Signed)
Pt reports sickness began Friday and has gotten worse. Pt reports HA, cough, ST. Body aches. Chest tightness, nausea, abdominal cramps and chills

## 2019-10-30 NOTE — Discharge Instructions (Signed)
Your symptoms are likely due to a viral illness. We advise 600mg  ibuprofen every 6 hours for body aches and headache. You may alternate this with Tylenol, if desired. Drink plenty of fluids to prevent dehydration. Follow-up with your primary care in the next 24-48 hours for recheck. You have a COVID test pending and should follow up on these test results within 2 days. Quarantine until test results return. You may return to the ED for new or concerning symptoms.

## 2019-10-31 LAB — NOVEL CORONAVIRUS, NAA (HOSP ORDER, SEND-OUT TO REF LAB; TAT 18-24 HRS): SARS-CoV-2, NAA: NOT DETECTED

## 2019-11-01 DIAGNOSIS — R0981 Nasal congestion: Secondary | ICD-10-CM | POA: Diagnosis not present

## 2019-11-01 DIAGNOSIS — J069 Acute upper respiratory infection, unspecified: Secondary | ICD-10-CM | POA: Diagnosis not present

## 2019-11-01 DIAGNOSIS — B349 Viral infection, unspecified: Secondary | ICD-10-CM | POA: Diagnosis not present

## 2019-11-28 ENCOUNTER — Telehealth: Payer: Self-pay | Admitting: *Deleted

## 2019-11-28 NOTE — Telephone Encounter (Signed)
Patient states she is out of the Diflucan that she takes before starting her cycle. She is requesting a refill.  Advised to check back with pharmacy later. Verbalized understanding.

## 2019-11-28 NOTE — Telephone Encounter (Signed)
Pt left message that she is out of the meds that Victorino Dike gave her. Wants to know if she is supposed to continue them or if she needs an appointment.

## 2019-11-29 MED ORDER — FLUCONAZOLE 150 MG PO TABS: ORAL_TABLET | ORAL | 2 refills | Status: DC

## 2019-11-29 NOTE — Telephone Encounter (Signed)
Will refill diflucan 

## 2019-11-29 NOTE — Addendum Note (Signed)
Addended by: Cyril Mourning A on: 11/29/2019 08:22 AM   Modules accepted: Orders

## 2020-02-17 IMAGING — CT CT RENAL STONE PROTOCOL
2 of 4 series · 16 of 46 positions shown, 18 images · non-contrast
Comparison: None.

CLINICAL DATA: 27-year-old female with acute LEFT flank and
abdominal pain today.

EXAM:
CT ABDOMEN AND PELVIS WITHOUT CONTRAST
TECHNIQUE: Multidetector CT imaging of the abdomen and pelvis was performed
following the standard protocol without IV contrast.

[Series 2: axial st · axial · 0.68mm/px · z∈[+1040,+1470]mm · 13 of 94 slices shown, 15 images]
[im 4/94  soft-tissue]
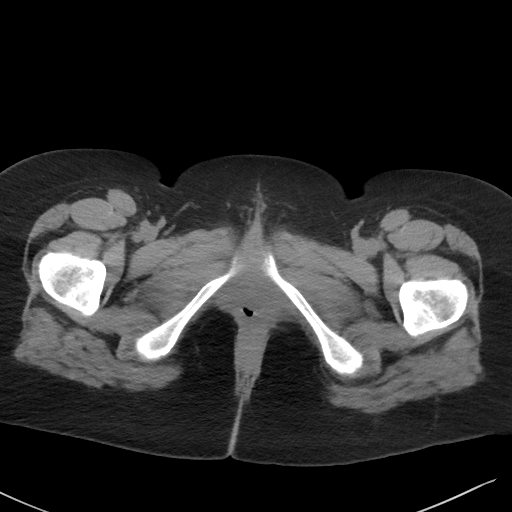
[im 4/94  bone]
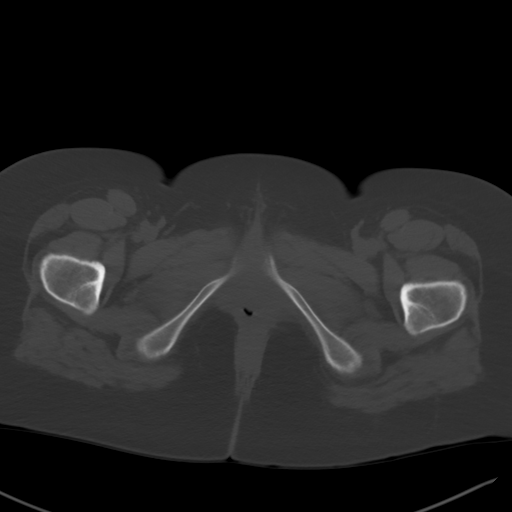
[im 12/94  soft-tissue]
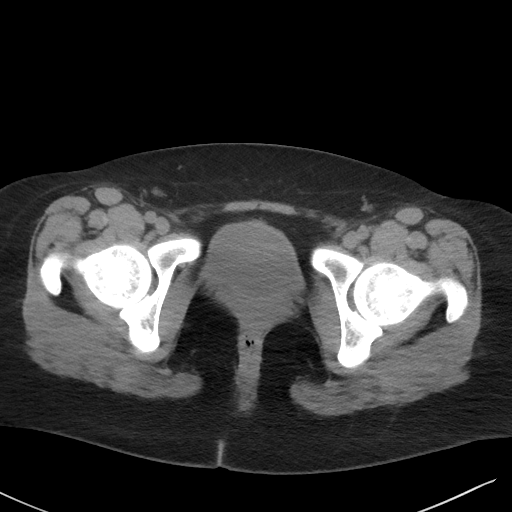
[im 20/94  soft-tissue]
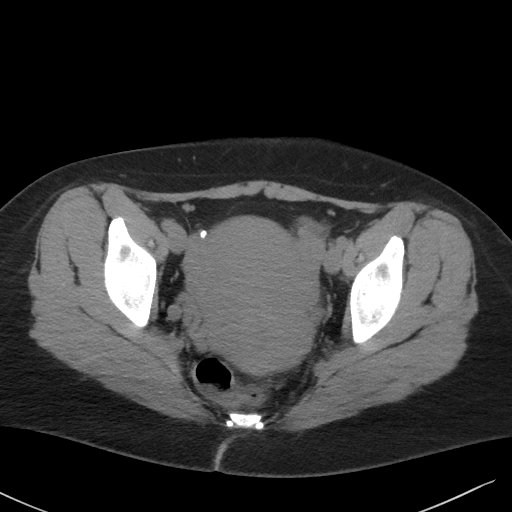
[im 28/94  soft-tissue]
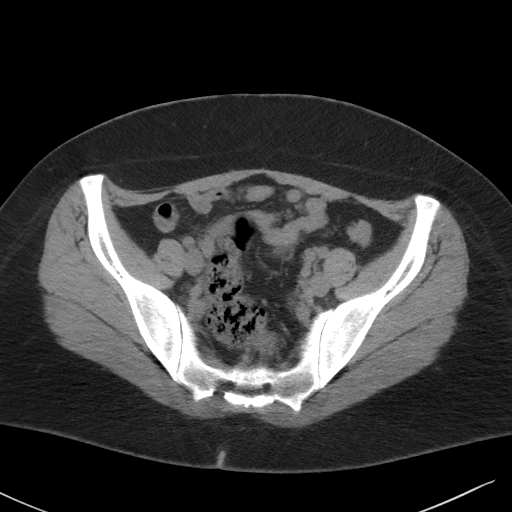
[im 32/94  soft-tissue]
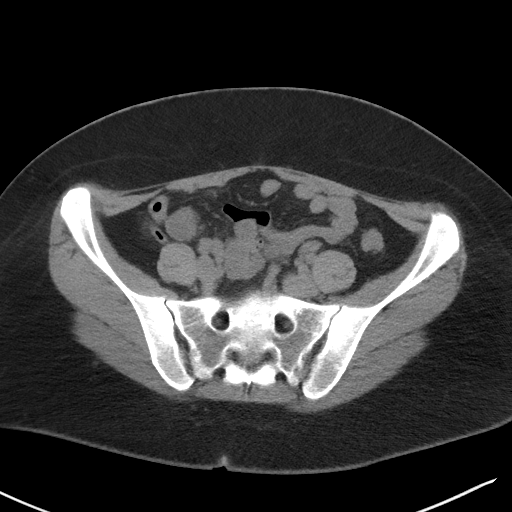
[im 39/94  soft-tissue]
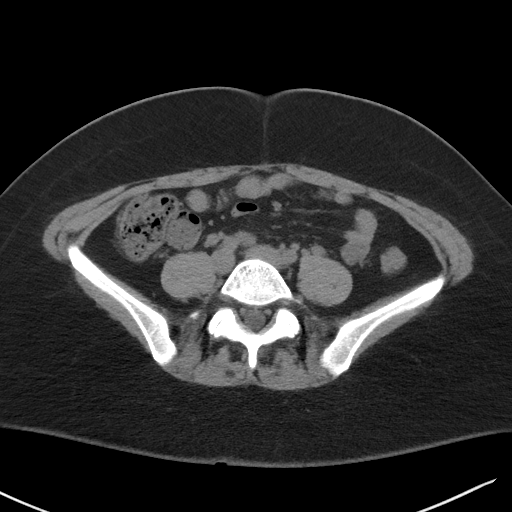
[im 47/94  soft-tissue]
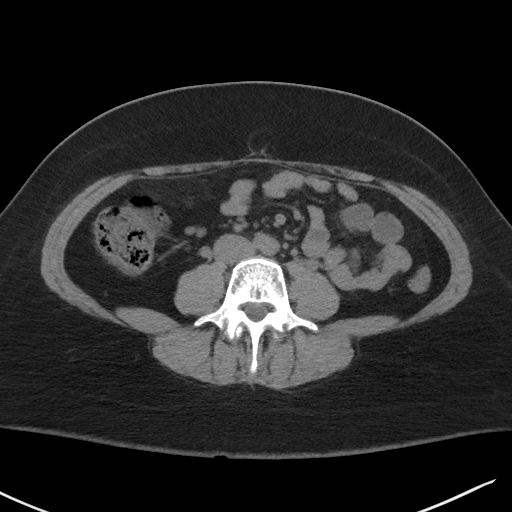
[im 55/94  soft-tissue]
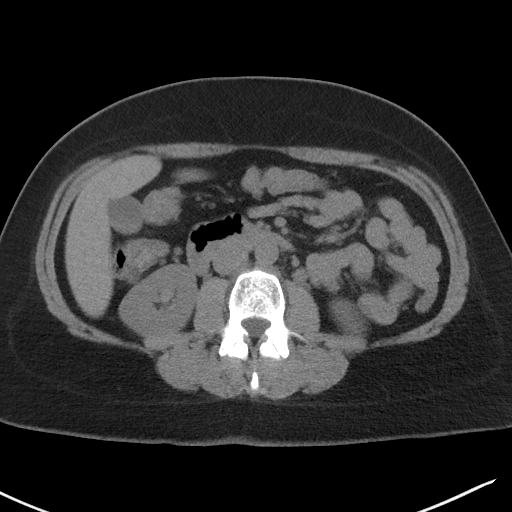
[im 63/94  soft-tissue]
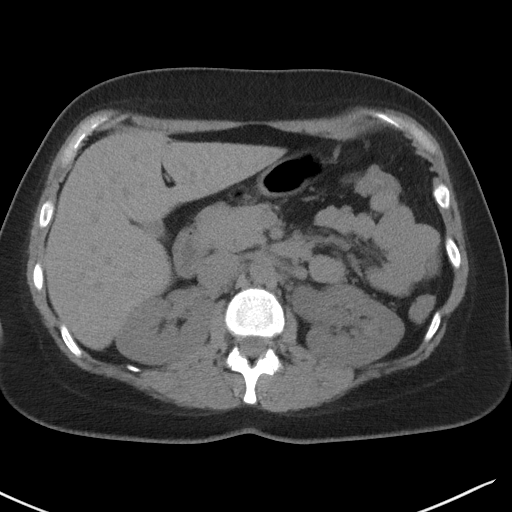
[im 63/94  bone]
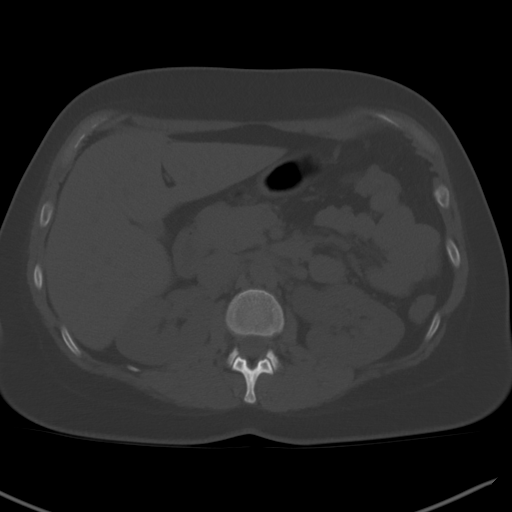
[im 66/94  soft-tissue]
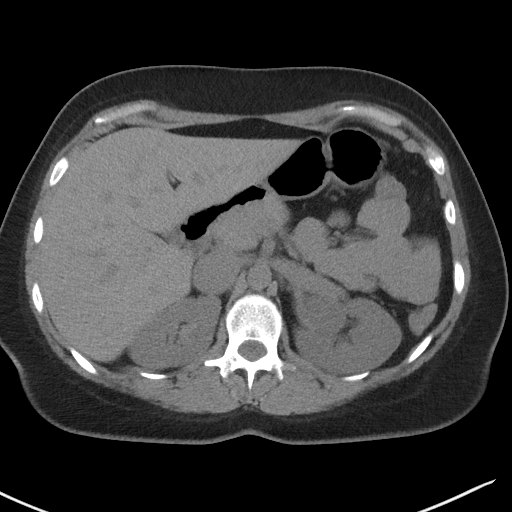
[im 74/94  soft-tissue]
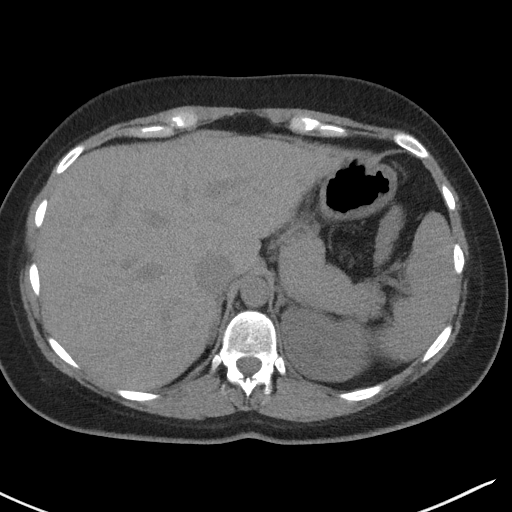
[im 82/94  soft-tissue]
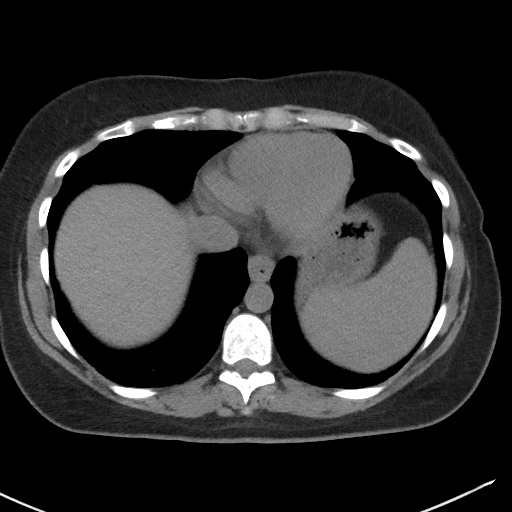
[im 90/94  soft-tissue]
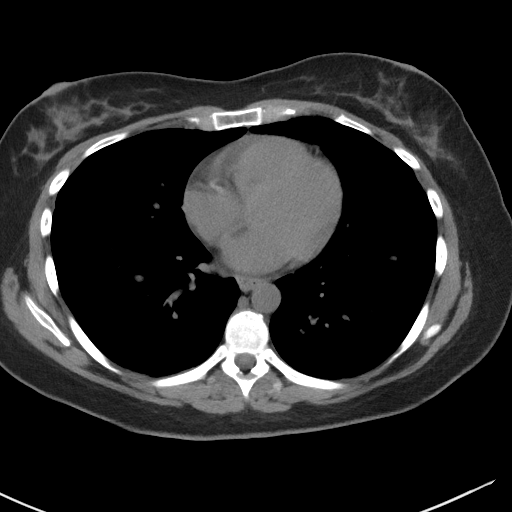

[Series 5: coronal st · coronal · 0.66mm/px · 3 of 92 slices shown]
[im 31/92  soft-tissue]
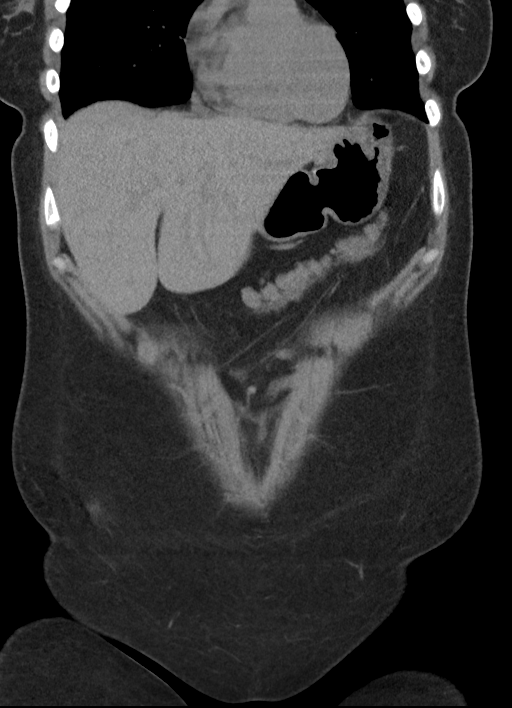
[im 41/92  soft-tissue]
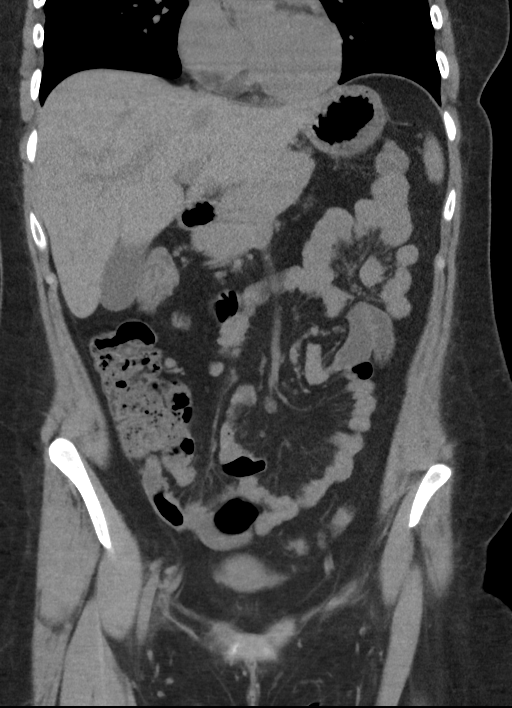
[im 51/92  soft-tissue]
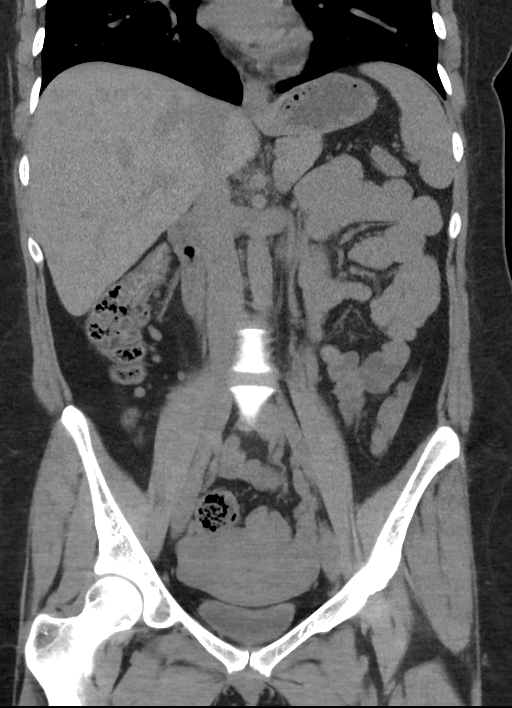

[16 of 46 positions shown; findings below may reference images not displayed]

FINDINGS: Please note that parenchymal abnormalities may be missed without
intravenous contrast.

Lower chest: No acute abnormality

Hepatobiliary: The liver and adrenal glands are unremarkable. No
biliary dilatation.

Pancreas: Unremarkable

Spleen: Unremarkable

Adrenals/Urinary Tract: A 5 x 10 mm LEFT UPJ calculus causes
moderate to severe LEFT hydronephrosis. No other urinary calculi are
identified.

The RIGHT kidney, adrenal glands and bladder are unremarkable.

Stomach/Bowel: Stomach is within normal limits. Appendix appears
normal. No evidence of bowel wall thickening, distention, or
inflammatory changes.

Vascular/Lymphatic: No significant vascular findings are present. No
enlarged abdominal or pelvic lymph nodes.

Reproductive: Uterus and bilateral adnexa are unremarkable.

Other: No ascites, focal collection or pneumoperitoneum.

Musculoskeletal: No acute or suspicious bony lesions identified.
Congenital partial fusion of the anterior aspect of T11-T12 noted
IMPRESSION: 5 x 10 mm LEFT UPJ calculus causing moderate to severe LEFT
hydronephrosis.

## 2020-03-06 ENCOUNTER — Ambulatory Visit: Payer: Medicaid Other | Attending: Internal Medicine

## 2020-03-06 ENCOUNTER — Other Ambulatory Visit: Payer: Self-pay

## 2020-03-06 DIAGNOSIS — Z20822 Contact with and (suspected) exposure to covid-19: Secondary | ICD-10-CM

## 2020-03-07 LAB — SARS-COV-2, NAA 2 DAY TAT

## 2020-03-07 LAB — NOVEL CORONAVIRUS, NAA: SARS-CoV-2, NAA: NOT DETECTED

## 2020-03-29 DIAGNOSIS — F329 Major depressive disorder, single episode, unspecified: Secondary | ICD-10-CM | POA: Diagnosis not present

## 2020-03-29 DIAGNOSIS — Z1331 Encounter for screening for depression: Secondary | ICD-10-CM | POA: Diagnosis not present

## 2020-03-29 DIAGNOSIS — Z0001 Encounter for general adult medical examination with abnormal findings: Secondary | ICD-10-CM | POA: Diagnosis not present

## 2020-03-29 DIAGNOSIS — G43909 Migraine, unspecified, not intractable, without status migrainosus: Secondary | ICD-10-CM | POA: Diagnosis not present

## 2020-03-29 DIAGNOSIS — Z6831 Body mass index (BMI) 31.0-31.9, adult: Secondary | ICD-10-CM | POA: Diagnosis not present

## 2020-04-02 ENCOUNTER — Emergency Department (HOSPITAL_COMMUNITY): Payer: BC Managed Care – PPO

## 2020-04-02 ENCOUNTER — Other Ambulatory Visit: Payer: Self-pay

## 2020-04-02 ENCOUNTER — Encounter (HOSPITAL_COMMUNITY): Payer: Self-pay | Admitting: *Deleted

## 2020-04-02 ENCOUNTER — Emergency Department (HOSPITAL_COMMUNITY)
Admission: EM | Admit: 2020-04-02 | Discharge: 2020-04-02 | Disposition: A | Discharge status: Home / Self Care | Payer: BC Managed Care – PPO | Attending: Emergency Medicine | Admitting: Emergency Medicine

## 2020-04-02 DIAGNOSIS — R112 Nausea with vomiting, unspecified: Secondary | ICD-10-CM | POA: Diagnosis not present

## 2020-04-02 DIAGNOSIS — N201 Calculus of ureter: Secondary | ICD-10-CM | POA: Diagnosis not present

## 2020-04-02 DIAGNOSIS — M545 Low back pain: Secondary | ICD-10-CM | POA: Insufficient documentation

## 2020-04-02 DIAGNOSIS — N132 Hydronephrosis with renal and ureteral calculous obstruction: Secondary | ICD-10-CM | POA: Diagnosis not present

## 2020-04-02 DIAGNOSIS — R109 Unspecified abdominal pain: Secondary | ICD-10-CM | POA: Diagnosis not present

## 2020-04-02 HISTORY — DX: Calculus of kidney: N20.0

## 2020-04-02 LAB — PREGNANCY, URINE: Preg Test, Ur: NEGATIVE

## 2020-04-02 LAB — CBC WITH DIFFERENTIAL/PLATELET
Abs Immature Granulocytes: 0.01 10*3/uL (ref 0.00–0.07)
Basophils Absolute: 0 10*3/uL (ref 0.0–0.1)
Basophils Relative: 0 %
Eosinophils Absolute: 0 10*3/uL (ref 0.0–0.5)
Eosinophils Relative: 1 %
HCT: 39.6 % (ref 36.0–46.0)
Hemoglobin: 13.2 g/dL (ref 12.0–15.0)
Immature Granulocytes: 0 %
Lymphocytes Relative: 26 %
Lymphs Abs: 1.5 10*3/uL (ref 0.7–4.0)
MCH: 30.5 pg (ref 26.0–34.0)
MCHC: 33.3 g/dL (ref 30.0–36.0)
MCV: 91.5 fL (ref 80.0–100.0)
Monocytes Absolute: 0.4 10*3/uL (ref 0.1–1.0)
Monocytes Relative: 6 %
Neutro Abs: 3.9 10*3/uL (ref 1.7–7.7)
Neutrophils Relative %: 67 %
Platelets: 226 10*3/uL (ref 150–400)
RBC: 4.33 MIL/uL (ref 3.87–5.11)
RDW: 12.5 % (ref 11.5–15.5)
WBC: 5.8 10*3/uL (ref 4.0–10.5)
nRBC: 0 % (ref 0.0–0.2)

## 2020-04-02 LAB — COMPREHENSIVE METABOLIC PANEL
ALT: 12 U/L (ref 0–44)
AST: 12 U/L — ABNORMAL LOW (ref 15–41)
Albumin: 4.6 g/dL (ref 3.5–5.0)
Alkaline Phosphatase: 50 U/L (ref 38–126)
Anion gap: 12 (ref 5–15)
BUN: 19 mg/dL (ref 6–20)
CO2: 23 mmol/L (ref 22–32)
Calcium: 9 mg/dL (ref 8.9–10.3)
Chloride: 104 mmol/L (ref 98–111)
Creatinine, Ser: 0.71 mg/dL (ref 0.44–1.00)
GFR calc Af Amer: >60 mL/min (ref >60)
GFR calc non Af Amer: >60 mL/min (ref >60)
Glucose, Bld: 133 mg/dL — ABNORMAL HIGH (ref 70–99)
Potassium: 3.3 mmol/L — ABNORMAL LOW (ref 3.5–5.1)
Sodium: 139 mmol/L (ref 135–145)
Total Bilirubin: 0.4 mg/dL (ref 0.3–1.2)
Total Protein: 7.5 g/dL (ref 6.5–8.1)

## 2020-04-02 LAB — URINALYSIS, ROUTINE W REFLEX MICROSCOPIC
Bilirubin Urine: NEGATIVE
Glucose, UA: NEGATIVE mg/dL
Hgb urine dipstick: NEGATIVE
Ketones, ur: NEGATIVE mg/dL
Leukocytes,Ua: NEGATIVE
Nitrite: NEGATIVE
Protein, ur: NEGATIVE mg/dL
Specific Gravity, Urine: 1.028 (ref 1.005–1.030)
pH: 5 (ref 5.0–8.0)

## 2020-04-02 MED ORDER — HYDROCODONE-ACETAMINOPHEN 5-325 MG PO TABS: 2.0000 | ORAL_TABLET | ORAL | 0 refills | Status: DC | PRN

## 2020-04-02 MED ORDER — ACETAMINOPHEN 325 MG PO TABS
650.0000 mg | ORAL_TABLET | Freq: Once | ORAL | Status: AC
  Administered 2020-04-02: 650 mg via ORAL
  Filled 2020-04-02: qty 2

## 2020-04-02 MED ORDER — KETOROLAC TROMETHAMINE 30 MG/ML IJ SOLN
15.0000 mg | Freq: Once | INTRAMUSCULAR | Status: AC
  Administered 2020-04-02: 15 mg via INTRAVENOUS
  Filled 2020-04-02: qty 1

## 2020-04-02 MED ORDER — ONDANSETRON HCL 4 MG/2ML IJ SOLN
4.0000 mg | Freq: Once | INTRAMUSCULAR | Status: AC
  Administered 2020-04-02: 4 mg via INTRAVENOUS
  Filled 2020-04-02: qty 2

## 2020-04-02 MED ORDER — ONDANSETRON 8 MG PO TBDP: 8.0000 mg | ORAL_TABLET | Freq: Three times a day (TID) | ORAL | 0 refills | Status: DC | PRN

## 2020-04-02 MED ORDER — OXYCODONE-ACETAMINOPHEN 5-325 MG PO TABS
1.0000 | ORAL_TABLET | Freq: Once | ORAL | Status: AC
  Administered 2020-04-02: 1 via ORAL
  Filled 2020-04-02: qty 1

## 2020-04-02 NOTE — Discharge Instructions (Signed)
We saw you in the ER for the abdominal pain. Our results indicate that you have a kidney stone. We were able to get your pain is relative control, and we can safely send you home.  Take the meds prescribed. Set up an appointment with the Urologist.  It is prudent that you follow-up. If the pain is unbearable, you start having fevers, chills, and are unable to keep any meds down - then return to the ER.

## 2020-04-02 NOTE — ED Triage Notes (Signed)
Pt c/o left flank pain with n/v that woke her up from sleep, has hx of kidney stones

## 2020-04-02 NOTE — ED Provider Notes (Addendum)
[facility] Provider Note   CSN: [id number] Arrival date & time: 04/02/20  0446     History Chief Complaint  Patient presents with  . Flank Pain    Jessica Ward is a 29 y.o. female.  HPI    29 year old female comes in a chief complaint of flank pain.  Patient reports that at 3 AM, the pain woke her up.  She has history of kidney stones and the pain is similar to it.  The pain starts in the left flank region and radiates down towards the groin.  Patient has had associated nausea and emesis.  She denies any major medical problems.  She does not think she is pregnant and denies any pelvic disorder history.  Past Medical History:  Diagnosis Date  . High cholesterol   . Kidney stones   . Miscarriage   . Normal delivery 10/27/2012    Patient Active Problem List   Diagnosis Date Noted  . Dyspareunia in female 03/29/2019  . Screening examination for STD (sexually transmitted disease) 03/29/2019  . Vaginal itching 03/29/2019  . Umbilical hernia reducible 03/23/2018  . Flu 11/30/2017  . Supervision of normal pregnancy 07/01/2017  . Spontaneous abortion without complication 10/28/3233  . Rubella non-immune status, antepartum 02/05/2015  . Sterilization consult 08/22/2014    Past Surgical History:  Procedure Laterality Date  . NO PAST SURGERIES    . TUBAL LIGATION Bilateral 03/23/2018   Procedure: BILATERAL TUBAL LIGATION WITH FALLOPE RINGS;  Surgeon: Jonnie Kind, MD;  Location: [facility];  Service: Gynecology;  Laterality: Bilateral;  . UMBILICAL HERNIA REPAIR N/A 03/23/2018   Procedure: HERNIA REPAIR UMBILICAL ADULT;  Surgeon: Jonnie Kind, MD;  Location: [facility];  Service: Gynecology;  Laterality: N/A;     OB History    Gravida  5   Para  4   Term  4   Preterm      AB  1   Living  4     SAB  1   TAB      Ectopic      Multiple  0   Live Births  4           Family History  Problem Relation Age of Onset  . Cancer Other         leukemia, breast-maternal great grandma  . Hypertension Mother   . Diabetes Maternal Grandfather   . Coronary artery disease Maternal Grandfather   . Heart attack Maternal Grandfather   . Stroke Maternal Grandfather   . Hyperlipidemia Father   . Heart disease Father        MI 12/2014  . Heart attack Father   . Other Daughter        tubes in ears  . Other Daughter        tubes in ears  . Hypercholesterolemia Paternal Aunt     Social History   Tobacco Use  . Smoking status: Never Smoker  . Smokeless tobacco: Never Used  Substance Use Topics  . Alcohol use: No  . Drug use: No    Home Medications Prior to Admission medications   Medication Sig Start Date End Date Taking? Authorizing Provider  acetaminophen (TYLENOL) 500 MG tablet Take 1,000 mg by mouth every 6 (six) hours as needed (for pain/headaches.).    [provider]  atorvastatin (LIPITOR) 10 MG tablet Take 10 mg by mouth daily. 03/30/20   [provider]  fluconazole (DIFLUCAN) 150 MG tablet Take 1 each  month before period 11/29/19   Adline Potter, NP  HYDROcodone-acetaminophen (NORCO/VICODIN) 5-325 MG tablet Take 2 tablets by mouth every 4 (four) hours as needed. 04/02/20   Derwood Kaplan, MD  ibuprofen (ADVIL) 800 MG tablet Take 1 tablet (800 mg total) by mouth 3 (three) times daily. 10/27/19   Avegno, Zachery Dakins, FNP  ondansetron (ZOFRAN ODT) 8 MG disintegrating tablet Take 1 tablet (8 mg total) by mouth every 8 (eight) hours as needed for nausea. 04/02/20   Derwood Kaplan, MD    Allergies    Patient has no known allergies.  Review of Systems   Review of Systems  Constitutional: Positive for activity change.  Gastrointestinal: Positive for abdominal pain, nausea and vomiting.  Genitourinary: Positive for flank pain.  Musculoskeletal: Positive for back pain.  Hematological: Does not bruise/bleed easily.  All other systems reviewed and are negative.   Physical Exam Updated Vital Signs BP  110/74   Pulse 70   Temp 98.2 F (36.8 C) (Oral)   Resp 16   Ht 5\' 4"  (1.626 m)   Wt 83 kg   LMP 03/05/2020   SpO2 100%   BMI 31.41 kg/m   Physical Exam Vitals and nursing note reviewed.  Constitutional:      General: She is in acute distress.     Appearance: She is well-developed.     Comments: Distress because of pain  HENT:     Head: Normocephalic and atraumatic.  Cardiovascular:     Rate and Rhythm: Normal rate.  Pulmonary:     Effort: Pulmonary effort is normal.  Abdominal:     General: Bowel sounds are normal.  Musculoskeletal:     Cervical back: Normal range of motion and neck supple.  Skin:    General: Skin is warm and dry.  Neurological:     Mental Status: She is alert and oriented to person, place, and time.     ED Results / Procedures / Treatments   Labs (all labs ordered are listed, but only abnormal results are displayed) Labs Reviewed  URINALYSIS, ROUTINE W REFLEX MICROSCOPIC - Abnormal; Notable for the following components:      Result Value   APPearance HAZY (*)    All other components within normal limits  COMPREHENSIVE METABOLIC PANEL - Abnormal; Notable for the following components:   Potassium 3.3 (*)    Glucose, Bld 133 (*)    AST 12 (*)    All other components within normal limits  PREGNANCY, URINE  CBC WITH DIFFERENTIAL/PLATELET    EKG None  Radiology DG Abdomen 1 View  Result Date: 04/02/2020 CLINICAL DATA:  Left-sided flank pain.  History of renal stones. EXAM: ABDOMEN - 1 VIEW COMPARISON:  CT abdomen pelvis-02/05/2019 FINDINGS: Previously identified approximately 1.4 cm stone within the proximal aspect of the left ureter on abdominal CT performed 02/05/2020 has migrated to the distal aspect of the left ureter, overlying expected location of the left UVJ. No additional stones overlies expected location of either renal fossa, the right ureter or the urinary bladder. Post tubal ligation. Nonobstructive bowel gas pattern.  No acute  osseous abnormalities. IMPRESSION: 1. Interval migration of known approximately 1.4 cm left-sided ureteral stone now overlying expected location of the left UVJ. 2. No additional renal stones identified. Electronically Signed   By: 02/07/2020 M.D.   On: 04/02/2020 07:50   04/04/2020 Renal  Result Date: 04/02/2020 CLINICAL DATA:  LEFT flank pain EXAM: RENAL / URINARY TRACT ULTRASOUND COMPLETE COMPARISON:  Renal CT  of 02/05/2019 FINDINGS: Right Kidney: Renal measurements: 11.4 x 5.2 x 6.9 cm = volume: 213.2 mL . Echogenicity within normal limits. No mass or hydronephrosis visualized. Left Kidney: Renal measurements: 12.1 x 6.9 x 6.0 cm = volume: 260 mL. Moderate LEFT added hydronephrosis. The ureter not well visualized. Hydronephrosis appears similar accounting for differences in technique to the previous imaging study. Bladder: Appears normal for degree of bladder distention. Other: None. IMPRESSION: 1. Hydronephrosis on the LEFT similar to a study of greater than 1 year ago. At that time there was a calculus in the proximal LEFT ureter. Ureter is not well evaluated on today's exam. 2. Correlatives plain film shows migration of the calculus at the level of the distal LEFT ureter. Urologic consultation based on size of the calculus, may be helpful if not yet obtained. Electronically Signed   By: Donzetta Kohut M.D.   On: 04/02/2020 08:51    Procedures Procedures (including critical care time)  Medications Ordered in ED Medications  acetaminophen (TYLENOL) tablet 650 mg (has no administration in time range)  ketorolac (TORADOL) 30 MG/ML injection 15 mg (15 mg Intravenous Given 04/02/20 0753)  ondansetron (ZOFRAN) injection 4 mg (4 mg Intravenous Given 04/02/20 0753)  oxyCODONE-acetaminophen (PERCOCET/ROXICET) 5-325 MG per tablet 1 tablet (1 tablet Oral Given 04/02/20 0539)    ED Course  I have reviewed the triage vital signs and the nursing notes.  Pertinent labs & imaging results that were available during  my care of the patient were reviewed by me and considered in my medical decision making (see chart for details).  Clinical Course as of Apr 02 941  Mon Apr 02, 2020  [id number] Patient reassessed at 9 AM and then again at 9:40 AM.  Pain continues to be better. Results of the x-ray and ultrasound reviewed.  I discussed the case with Dr. Arita Miss, she recommends the patient follow-up closely with urology in [place].  This recommendation has been discussed with the patient along with strict ER return precautions.   [AN]    Clinical Course User Index [AN] Derwood Kaplan, MD   MDM Rules/Calculators/A&P                          29 year old female comes in a chief complaint of left-sided flank pain. Pain is of sudden onset, and there is history of left-sided kidney stones. CT scan reviewed from 01/2019.  It appears that either that stone passed on its own or had not cause any issues.  Alternatively be considered ovarian torsion, ectopic pregnancy in the differential as well.  U pregnancy negative.  UA is overall reassuring.  We will start with KUB and ultrasound kidney and focus on symptom control for now.  Final Clinical Impression(s) / ED Diagnoses Final diagnoses:  Ureteral stone    Rx / DC Orders ED Discharge Orders         Ordered    HYDROcodone-acetaminophen (NORCO/VICODIN) 5-325 MG tablet  Every 4 hours PRN     Discontinue  Reprint     04/02/20 0941    ondansetron (ZOFRAN ODT) 8 MG disintegrating tablet  Every 8 hours PRN     Discontinue  Reprint     04/02/20 0941           Derwood Kaplan, MD 04/02/20 [id number]    Derwood Kaplan, MD 04/02/20 [phone]

## 2020-04-03 ENCOUNTER — Ambulatory Visit (INDEPENDENT_AMBULATORY_CARE_PROVIDER_SITE_OTHER): Payer: BC Managed Care – PPO | Admitting: Urology

## 2020-04-03 ENCOUNTER — Encounter: Payer: Self-pay | Admitting: Urology

## 2020-04-03 VITALS — BP 93/62 | HR 71 | Temp 98.1°F | Ht 64.0 in | Wt 183.0 lb

## 2020-04-03 DIAGNOSIS — N200 Calculus of kidney: Secondary | ICD-10-CM

## 2020-04-03 LAB — POCT URINALYSIS DIPSTICK
Bilirubin, UA: NEGATIVE
Glucose, UA: NEGATIVE
Ketones, UA: NEGATIVE
Nitrite, UA: NEGATIVE
Protein, UA: POSITIVE — AB
Spec Grav, UA: 1.02 (ref 1.010–1.025)
Urobilinogen, UA: 0.2 E.U./dL
pH, UA: 6 (ref 5.0–8.0)

## 2020-04-03 NOTE — Progress Notes (Signed)
See progress note.

## 2020-04-03 NOTE — Progress Notes (Signed)
H&P  Chief Complaint: Ureteral Calculus  History of Present Illness: Jessica Ward is a 29 y.o. year old female here today for follow-up on recent ER visit (6.14.2021) for acute left flank pain. On 4.18.2021, imaging revealed 1.4 cm stone present in the proximal aspect of her left ureter -- this same stone was present on repeat CT, seemingly migrated to the distal aspect. Moderate left hydro noted. She reports having been diagnosed with this stone for over a year now, but due to cost of care without insurance she opted to delay treatment. She has been having intermittent pain over the last year but only recently developed the severe flank pain and urinary sx's she c/o at present.    Past Medical History:  Diagnosis Date  . High cholesterol   . Kidney stones   . Miscarriage   . Normal delivery 10/27/2012    Past Surgical History:  Procedure Laterality Date  . NO PAST SURGERIES    . TUBAL LIGATION Bilateral 03/23/2018   Procedure: BILATERAL TUBAL LIGATION WITH FALLOPE RINGS;  Surgeon: Jonnie Kind, MD;  Location: [facility];  Service: Gynecology;  Laterality: Bilateral;  . UMBILICAL HERNIA REPAIR N/A 03/23/2018   Procedure: HERNIA REPAIR UMBILICAL ADULT;  Surgeon: Jonnie Kind, MD;  Location: [facility];  Service: Gynecology;  Laterality: N/A;    Home Medications:  (Not in a hospital admission)   Allergies: No Known Allergies  Family History  Problem Relation Age of Onset  . Cancer Other        leukemia, breast-maternal great grandma  . Hypertension Mother   . Diabetes Maternal Grandfather   . Coronary artery disease Maternal Grandfather   . Heart attack Maternal Grandfather   . Stroke Maternal Grandfather   . Hyperlipidemia Father   . Heart disease Father        MI 12/2014  . Heart attack Father   . Other Daughter        tubes in ears  . Other Daughter        tubes in ears  . Hypercholesterolemia Paternal Aunt     Social History:  reports that she has never smoked. She has  never used smokeless tobacco. She reports that she does not drink alcohol and does not use drugs.  ROS: Urological Symptom Review  Patient is experiencing the following symptoms: Get up at night to urinate  Kidney Stones Review of Systems Gastrointestinal (upper)  : Nausea Vomiting Gastrointestinal (lower) : Negative for lower GI symptoms Constitutional : Negative for symptoms Skin: Negative for skin symptoms Eyes: Negative for eye symptoms Ear/Nose/Throat : Negative for Ear/Nose/Throat symptoms Hematologic/Lymphatic: Negative for Hematologic/Lymphatic symptoms Cardiovascular : Negative for cardiovascular symptoms Respiratory : Negative for respiratory symptoms Endocrine: Negative for endocrine symptoms Musculoskeletal: Negative for musculoskeletal symptoms Neurological: Negative for neurological symptoms Psychologic Negative for psychiatric symptoms  Physical Exam:  Vital signs in last 24 hours: Temp:  [98.1 F (36.7 C)] 98.1 F (36.7 C) (06/15 0945) Pulse Rate:  [71] 71 (06/15 0945) BP: (93)/(62) 93/62 (06/15 0945) Weight:  [183 lb (83 kg)] 183 lb (83 kg) (06/15 0945) General:  Alert and oriented, No acute distress HEENT: Normocephalic, atraumatic Neck: No JVD or lymphadenopathy Cardiovascular: Regular rate  Lungs: Normal inspiratory/expiratory excursion Abdomen: Soft, nontender, nondistended, no abdominal masses Back: No CVA tenderness Extremities: No edema Neurologic: Grossly intact  Laboratory Data:  Results for orders placed or performed in visit on 04/03/20 (from the past 24 hour(s))  POCT urinalysis dipstick  Status: Abnormal   Collection Time: 04/03/20  9:50 AM  Result Value Ref Range   Color, UA yellow    Clarity, UA     Glucose, UA Negative Negative   Bilirubin, UA neg    Ketones, UA neg    Spec Grav, UA 1.020 1.010 - 1.025   Blood, UA +++    pH, UA 6.0 5.0 - 8.0   Protein, UA Positive (A) Negative   Urobilinogen, UA 0.2 0.2 or 1.0  E.U./dL   Nitrite, UA neg    Leukocytes, UA Small (1+) (A) Negative   Appearance clear    Odor     No results found for this or any previous visit (from the past 240 hour(s)). Creatinine: Recent Labs    04/02/20 0551  CREATININE 0.71    Radiologic Imaging: DG Abdomen 1 View  Result Date: 04/02/2020 CLINICAL DATA:  Left-sided flank pain.  History of renal stones. EXAM: ABDOMEN - 1 VIEW COMPARISON:  CT abdomen pelvis-02/05/2019 FINDINGS: Previously identified approximately 1.4 cm stone within the proximal aspect of the left ureter on abdominal CT performed 02/05/2020 has migrated to the distal aspect of the left ureter, overlying expected location of the left UVJ. No additional stones overlies expected location of either renal fossa, the right ureter or the urinary bladder. Post tubal ligation. Nonobstructive bowel gas pattern.  No acute osseous abnormalities. IMPRESSION: 1. Interval migration of known approximately 1.4 cm left-sided ureteral stone now overlying expected location of the left UVJ. 2. No additional renal stones identified. Electronically Signed   By: Simonne Come M.D.   On: 04/02/2020 07:50   US Renal  Result Date: 04/02/2020 CLINICAL DATA:  LEFT flank pain EXAM: RENAL / URINARY TRACT ULTRASOUND COMPLETE COMPARISON:  Renal CT of 02/05/2019 FINDINGS: Right Kidney: Renal measurements: 11.4 x 5.2 x 6.9 cm = volume: 213.2 mL . Echogenicity within normal limits. No mass or hydronephrosis visualized. Left Kidney: Renal measurements: 12.1 x 6.9 x 6.0 cm = volume: 260 mL. Moderate LEFT added hydronephrosis. The ureter not well visualized. Hydronephrosis appears similar accounting for differences in technique to the previous imaging study. Bladder: Appears normal for degree of bladder distention. Other: None. IMPRESSION: 1. Hydronephrosis on the LEFT similar to a study of greater than 1 year ago. At that time there was a calculus in the proximal LEFT ureter. Ureter is not well evaluated on  today's exam. 2. Correlatives plain film shows migration of the calculus at the level of the distal LEFT ureter. Urologic consultation based on size of the calculus, may be helpful if not yet obtained. Electronically Signed   By: Donzetta Kohut M.D.   On: 04/02/2020 08:51    Impression/Assessment:  1.4x0.8 cm left distal ureteral stone with associated moderate hydronephrosis. She has been attempting to pass this stone for well over a year now and I believe it would be appropriate to move forward with su   rgical intervention.   Plan:  1. Discussed and provided information on treatment options for her stone including ureteroscopy or ESWL -- recommended ureteroscopy given size and position of stone  2. Will call to schedule ureteroscopy w/ holmium laser and stent placement.   Jessica Ward 04/03/2020, 10:00 AM  Bertram Millard. Debroah Shuttleworth MD

## 2020-04-05 ENCOUNTER — Telehealth: Payer: Self-pay

## 2020-04-06 ENCOUNTER — Other Ambulatory Visit: Payer: Self-pay | Admitting: Urology

## 2020-04-06 NOTE — Telephone Encounter (Signed)
The pts husband called again checking a work excuse for his wife, her pain meds and her sx. I explained as soon a I heard from Dr. I would call him back. I gave him number to [facility] for info about sx.

## 2020-04-09 ENCOUNTER — Other Ambulatory Visit: Payer: Self-pay

## 2020-04-09 ENCOUNTER — Other Ambulatory Visit: Payer: Self-pay | Admitting: Urology

## 2020-04-09 DIAGNOSIS — N201 Calculus of ureter: Secondary | ICD-10-CM

## 2020-04-09 MED ORDER — HYDROCODONE-ACETAMINOPHEN 5-325 MG PO TABS: 2.0000 | ORAL_TABLET | ORAL | 0 refills | Status: DC | PRN

## 2020-04-09 NOTE — Telephone Encounter (Signed)
The pts husband called in again this morning asking about pain meds and work note. I talked with Dr. Retta Diones  because he was away earlier. He said he would send in the pain meds and pt could have work note covering her from June 16th to date of planned sx. This is July 12th. Also, he would try to move pts sx date up. I notified husband of these and told him I would try to call pts employer to fax work note. He stated if I couldn't fax it he would come to pick it up.  Pts work note was faxed.

## 2020-04-13 NOTE — Patient Instructions (Signed)
Your procedure is scheduled on: 04/17/2020  Report to [facility] at  11:00   AM.  Call this number if you have problems the morning of surgery: [phone]   Remember:   Do not Eat or Drink after midnight         No Smoking the morning of surgery  :  Take these medicines the morning of surgery with A SIP OF WATER: lexapro  (hydrocodone and/or zofran if needed)   Do not wear jewelry, make-up or nail polish.  Do not wear lotions, powders, or perfumes. You may wear deodorant.  Do not shave 48 hours prior to surgery. Men may shave face and neck.  Do not bring valuables to the hospital.  Contacts, dentures or bridgework may not be worn into surgery.  Leave suitcase in the car. After surgery it may be brought to your room.  For patients admitted to the hospital, checkout time is 11:00 AM the day of discharge.   Patients discharged the day of surgery will not be allowed to drive home.     Ureteroscopy Ureteroscopy is a procedure to check for and treat problems inside part of the urinary tract. In this procedure, a thin, tube-shaped instrument with a light at the end (ureteroscope) is used to look at the inside of the kidneys and the ureters, which are the tubes that carry urine from the kidneys to the bladder. The ureteroscope is inserted into one or both of the ureters. You may need this procedure if you have frequent urinary tract infections (UTIs), blood in your urine, or a stone in one of your ureters. A ureteroscopy can be done to find the cause of urine blockage in a ureter and to evaluate other abnormalities inside the ureters or kidneys. If stones are found, they can be removed during the procedure. Polyps, abnormal tissue, and some types of tumors can also be removed or treated. The ureteroscope may also have a tool to remove tissue to be checked for disease under a microscope (biopsy). Tell a health care provider about:  Any allergies you have.  All medicines you are taking,  including vitamins, herbs, eye drops, creams, and over-the-counter medicines.  Any problems you or family members have had with anesthetic medicines.  Any blood disorders you have.  Any surgeries you have had.  Any medical conditions you have.  Whether you are pregnant or may be pregnant. What are the risks? Generally, this is a safe procedure. However, problems may occur, including:  Bleeding.  Infection.  Allergic reactions to medicines.  Scarring that narrows the ureter (stricture).  Creating a hole in the ureter (perforation).   Medicines  Ask your health care provider about: ? Changing or stopping your regular medicines. This is especially important if you are taking diabetes medicines or blood thinners. ? Taking medicines such as aspirin and ibuprofen. These medicines can thin your blood. Do not take these medicines before your procedure if your health care provider instructs you not to.  You may be given antibiotic medicine to help prevent infection. General instructions  You may have a urine sample taken to check for infection.  Plan to have someone take you home from the hospital or clinic. What happens during the procedure?   To reduce your risk of infection: ? Your health care team will wash or sanitize their hands. ? Your skin will be washed with soap.  An IV tube will be inserted into one of your veins.  You will be given  one of the following: ? A medicine to help you relax (sedative). ? A medicine to make you fall asleep (general anesthetic). ? A medicine that is injected into your spine to numb the area below and slightly above the injection site (spinal anesthetic).  To lower your risk of infection, you may be given an antibiotic medicine by an injection or through the IV tube.  The opening from which you urinate (urethra) will be cleaned with a germ-killing solution.  The ureteroscope will be passed through your urethra into your bladder.  A  salt-water solution will flow through the ureteroscope to fill your bladder. This will help the health care provider see the openings of your ureters more clearly.  Then, the ureteroscope will be passed into your ureter. ? If a growth is found, a piece of it may be removed so it can be examined under a microscope (biopsy). ? If a stone is found, it may be removed through the ureteroscope, or the stone may be broken up using a laser, shock waves, or electrical energy. ? In some cases, if the ureter is too small, a tube may be inserted that keeps the ureter open (ureteral stent). The stent may be left in place for 1 or 2 weeks to keep the ureter open, and then the ureteroscopy procedure will be performed.  The scope will be removed, and your bladder will be emptied. The procedure may vary among health care providers and hospitals. What happens after the procedure?  Your blood pressure, heart rate, breathing rate, and blood oxygen level will be monitored until the medicines you were given have worn off.  You may be asked to urinate.  Donot drive for 24 hours if you were given a sedative. This information is not intended to replace advice given to you by your health care provider. Make sure you discuss any questions you have with your health care provider. Document Revised: 09/18/2017 Document Reviewed: 07/18/2016 Elsevier Patient Education  2020 [place] Anesthesia, Adult, Care After This sheet gives you information about how to care for yourself after your procedure. Your health care provider may also give you more specific instructions. If you have problems or questions, contact your health care provider. What can I expect after the procedure? After the procedure, the following side effects are common:  Pain or discomfort at the IV site.  Nausea.  Vomiting.  Sore throat.  Trouble concentrating.  Feeling cold or chills.  Weak or tired.  Sleepiness and  fatigue.  Soreness and body aches. These side effects can affect parts of the body that were not involved in surgery. Follow these instructions at home:  For at least 24 hours after the procedure:  Have a responsible adult stay with you. It is important to have someone help care for you until you are awake and alert.  Rest as needed.  Do not: ? Participate in activities in which you could fall or become injured. ? Drive. ? Use heavy machinery. ? Drink alcohol. ? Take sleeping pills or medicines that cause drowsiness. ? Make important decisions or sign legal documents. ? Take care of children on your own. Eating and drinking  Follow any instructions from your health care provider about eating or drinking restrictions.  When you feel hungry, start by eating small amounts of foods that are soft and easy to digest (bland), such as toast. Gradually return to your regular diet.  Drink enough fluid to keep your urine pale yellow.  If  you vomit, rehydrate by drinking water, juice, or clear broth. General instructions  If you have sleep apnea, surgery and certain medicines can increase your risk for breathing problems. Follow instructions from your health care provider about wearing your sleep device: ? Anytime you are sleeping, including during daytime naps. ? While taking prescription pain medicines, sleeping medicines, or medicines that make you drowsy.  Return to your normal activities as told by your health care provider. Ask your health care provider what activities are safe for you.  Take over-the-counter and prescription medicines only as told by your health care provider.  If you smoke, do not smoke without supervision.  Keep all follow-up visits as told by your health care provider. This is important. Contact a health care provider if:  You have nausea or vomiting that does not get better with medicine.  You cannot eat or drink without vomiting.  You have pain that  does not get better with medicine.  You are unable to pass urine.  You develop a skin rash.  You have a fever.  You have redness around your IV site that gets worse. Get help right away if:  You have difficulty breathing.  You have chest pain.  You have blood in your urine or stool, or you vomit blood. Summary  After the procedure, it is common to have a sore throat or nausea. It is also common to feel tired.  Have a responsible adult stay with you for the first 24 hours after general anesthesia. It is important to have someone help care for you until you are awake and alert.  When you feel hungry, start by eating small amounts of foods that are soft and easy to digest (bland), such as toast. Gradually return to your regular diet.  Drink enough fluid to keep your urine pale yellow.  Return to your normal activities as told by your health care provider. Ask your health care provider what activities are safe for you. This information is not intended to replace advice given to you by your health care provider. Make sure you discuss any questions you have with your health care provider. Document Revised: 10/09/2017 Document Reviewed: 05/22/2017 Elsevier Patient Education  2020 [employer].

## 2020-04-16 ENCOUNTER — Other Ambulatory Visit: Payer: Self-pay

## 2020-04-16 ENCOUNTER — Other Ambulatory Visit (HOSPITAL_COMMUNITY)
Admission: RE | Admit: 2020-04-16 | Discharge: 2020-04-16 | Disposition: A | Discharge status: Home / Self Care | Payer: BC Managed Care – PPO | Source: Ambulatory Visit | Attending: Urology | Admitting: Urology

## 2020-04-16 ENCOUNTER — Encounter (HOSPITAL_COMMUNITY)
Admission: RE | Admit: 2020-04-16 | Discharge: 2020-04-16 | Disposition: A | Discharge status: Home / Self Care | Payer: BC Managed Care – PPO | Source: Ambulatory Visit | Attending: Urology | Admitting: Urology

## 2020-04-16 ENCOUNTER — Encounter (HOSPITAL_COMMUNITY): Payer: Self-pay

## 2020-04-16 DIAGNOSIS — Z01812 Encounter for preprocedural laboratory examination: Secondary | ICD-10-CM | POA: Insufficient documentation

## 2020-04-16 DIAGNOSIS — Z20822 Contact with and (suspected) exposure to covid-19: Secondary | ICD-10-CM | POA: Insufficient documentation

## 2020-04-16 LAB — PREGNANCY, URINE: Preg Test, Ur: NEGATIVE

## 2020-04-16 LAB — SARS CORONAVIRUS 2 (TAT 6-24 HRS): SARS Coronavirus 2: NEGATIVE

## 2020-04-17 ENCOUNTER — Ambulatory Visit (HOSPITAL_COMMUNITY): Payer: BC Managed Care – PPO | Admitting: Anesthesiology

## 2020-04-17 ENCOUNTER — Ambulatory Visit (HOSPITAL_COMMUNITY)
Admission: RE | Admit: 2020-04-17 | Discharge: 2020-04-17 | Disposition: A | Discharge status: Home / Self Care | Payer: BC Managed Care – PPO | Attending: Urology | Admitting: Urology

## 2020-04-17 ENCOUNTER — Other Ambulatory Visit: Payer: Self-pay

## 2020-04-17 ENCOUNTER — Encounter (HOSPITAL_COMMUNITY): Payer: Self-pay | Admitting: Urology

## 2020-04-17 ENCOUNTER — Ambulatory Visit (HOSPITAL_COMMUNITY): Payer: BC Managed Care – PPO

## 2020-04-17 ENCOUNTER — Encounter (HOSPITAL_COMMUNITY)
Admission: RE | Disposition: A | Discharge status: Home / Self Care | Payer: Self-pay | Source: Home / Self Care | Attending: Urology

## 2020-04-17 DIAGNOSIS — N132 Hydronephrosis with renal and ureteral calculous obstruction: Secondary | ICD-10-CM | POA: Insufficient documentation

## 2020-04-17 DIAGNOSIS — Z8249 Family history of ischemic heart disease and other diseases of the circulatory system: Secondary | ICD-10-CM | POA: Diagnosis not present

## 2020-04-17 DIAGNOSIS — E7800 Pure hypercholesterolemia, unspecified: Secondary | ICD-10-CM | POA: Diagnosis not present

## 2020-04-17 DIAGNOSIS — Z803 Family history of malignant neoplasm of breast: Secondary | ICD-10-CM | POA: Diagnosis not present

## 2020-04-17 DIAGNOSIS — Z8349 Family history of other endocrine, nutritional and metabolic diseases: Secondary | ICD-10-CM | POA: Insufficient documentation

## 2020-04-17 DIAGNOSIS — Z806 Family history of leukemia: Secondary | ICD-10-CM | POA: Insufficient documentation

## 2020-04-17 DIAGNOSIS — Z791 Long term (current) use of non-steroidal anti-inflammatories (NSAID): Secondary | ICD-10-CM | POA: Diagnosis not present

## 2020-04-17 DIAGNOSIS — Z79899 Other long term (current) drug therapy: Secondary | ICD-10-CM | POA: Diagnosis not present

## 2020-04-17 DIAGNOSIS — Z87442 Personal history of urinary calculi: Secondary | ICD-10-CM | POA: Diagnosis not present

## 2020-04-17 DIAGNOSIS — Z833 Family history of diabetes mellitus: Secondary | ICD-10-CM | POA: Diagnosis not present

## 2020-04-17 HISTORY — PX: CYSTOSCOPY WITH RETROGRADE PYELOGRAM, URETEROSCOPY AND STENT PLACEMENT: SHX5789

## 2020-04-17 SURGERY — CYSTOURETEROSCOPY, WITH RETROGRADE PYELOGRAM AND STENT INSERTION
Anesthesia: General | Site: Urethra | Laterality: Left

## 2020-04-17 MED ORDER — MIDAZOLAM HCL 2 MG/2ML IJ SOLN
INTRAMUSCULAR | Status: AC
  Filled 2020-04-17: qty 2

## 2020-04-17 MED ORDER — DEXAMETHASONE SODIUM PHOSPHATE 10 MG/ML IJ SOLN
INTRAMUSCULAR | Status: AC
  Filled 2020-04-17: qty 1

## 2020-04-17 MED ORDER — ORAL CARE MOUTH RINSE: 15.0000 mL | Freq: Once | OROMUCOSAL | Status: AC

## 2020-04-17 MED ORDER — ONDANSETRON HCL 4 MG/2ML IJ SOLN
INTRAMUSCULAR | Status: AC
  Filled 2020-04-17: qty 2

## 2020-04-17 MED ORDER — KETOROLAC TROMETHAMINE 30 MG/ML IJ SOLN
INTRAMUSCULAR | Status: DC | PRN
  Administered 2020-04-17: 30 mg via INTRAVENOUS

## 2020-04-17 MED ORDER — WATER FOR IRRIGATION, STERILE IR SOLN
Status: DC | PRN
  Administered 2020-04-17: 1000 mL

## 2020-04-17 MED ORDER — PROPOFOL 10 MG/ML IV BOLUS
INTRAVENOUS | Status: DC | PRN
  Administered 2020-04-17: 200 mg via INTRAVENOUS

## 2020-04-17 MED ORDER — FENTANYL CITRATE (PF) 100 MCG/2ML IJ SOLN
INTRAMUSCULAR | Status: DC | PRN
  Administered 2020-04-17 (×3): 50 ug via INTRAVENOUS

## 2020-04-17 MED ORDER — DEXAMETHASONE SODIUM PHOSPHATE 4 MG/ML IJ SOLN
INTRAMUSCULAR | Status: DC | PRN
  Administered 2020-04-17: 8 mg via INTRAVENOUS

## 2020-04-17 MED ORDER — SODIUM CHLORIDE 0.9 % IR SOLN
Status: DC | PRN
  Administered 2020-04-17: 1000 mL

## 2020-04-17 MED ORDER — FENTANYL CITRATE (PF) 100 MCG/2ML IJ SOLN
25.0000 ug | INTRAMUSCULAR | Status: DC | PRN
  Administered 2020-04-17 (×3): 50 ug via INTRAVENOUS
  Filled 2020-04-17 (×2): qty 2

## 2020-04-17 MED ORDER — ONDANSETRON HCL 4 MG/2ML IJ SOLN: 4.0000 mg | Freq: Once | INTRAMUSCULAR | Status: DC | PRN

## 2020-04-17 MED ORDER — LACTATED RINGERS IV SOLN: INTRAVENOUS | Status: DC

## 2020-04-17 MED ORDER — DIATRIZOATE MEGLUMINE 30 % UR SOLN
URETHRAL | Status: DC | PRN
  Administered 2020-04-17: 300 mL via URETHRAL

## 2020-04-17 MED ORDER — CEFAZOLIN SODIUM-DEXTROSE 2-4 GM/100ML-% IV SOLN
2.0000 g | INTRAVENOUS | Status: AC
  Administered 2020-04-17: 2 g via INTRAVENOUS
  Filled 2020-04-17: qty 100

## 2020-04-17 MED ORDER — PROPOFOL 10 MG/ML IV BOLUS
INTRAVENOUS | Status: AC
  Filled 2020-04-17: qty 40

## 2020-04-17 MED ORDER — DIATRIZOATE MEGLUMINE 30 % UR SOLN
URETHRAL | Status: AC
  Filled 2020-04-17: qty 100

## 2020-04-17 MED ORDER — CEPHALEXIN 500 MG PO CAPS: 500.0000 mg | ORAL_CAPSULE | Freq: Two times a day (BID) | ORAL | 0 refills | Status: DC

## 2020-04-17 MED ORDER — FENTANYL CITRATE (PF) 250 MCG/5ML IJ SOLN
INTRAMUSCULAR | Status: AC
  Filled 2020-04-17: qty 5

## 2020-04-17 MED ORDER — LIDOCAINE 2% (20 MG/ML) 5 ML SYRINGE
INTRAMUSCULAR | Status: AC
  Filled 2020-04-17: qty 5

## 2020-04-17 MED ORDER — KETOROLAC TROMETHAMINE 30 MG/ML IJ SOLN
INTRAMUSCULAR | Status: AC
  Filled 2020-04-17: qty 1

## 2020-04-17 MED ORDER — LIDOCAINE HCL (CARDIAC) PF 100 MG/5ML IV SOSY
PREFILLED_SYRINGE | INTRAVENOUS | Status: DC | PRN
  Administered 2020-04-17: 80 mg via INTRAVENOUS

## 2020-04-17 MED ORDER — MIDAZOLAM HCL 2 MG/2ML IJ SOLN
INTRAMUSCULAR | Status: DC | PRN
  Administered 2020-04-17: 2 mg via INTRAVENOUS

## 2020-04-17 MED ORDER — ONDANSETRON HCL 4 MG/2ML IJ SOLN
INTRAMUSCULAR | Status: DC | PRN
  Administered 2020-04-17: 4 mg via INTRAVENOUS

## 2020-04-17 MED ORDER — CHLORHEXIDINE GLUCONATE 0.12 % MT SOLN
15.0000 mL | Freq: Once | OROMUCOSAL | Status: AC
  Administered 2020-04-17: 15 mL via OROMUCOSAL

## 2020-04-17 MED ORDER — GLYCOPYRROLATE PF 0.2 MG/ML IJ SOSY
PREFILLED_SYRINGE | INTRAMUSCULAR | Status: AC
  Filled 2020-04-17: qty 1

## 2020-04-17 MED ORDER — CHLORHEXIDINE GLUCONATE 0.12 % MT SOLN
OROMUCOSAL | Status: AC
  Filled 2020-04-17: qty 15

## 2020-04-17 SURGICAL SUPPLY — 26 items
BAG URINE DRAINAGE (UROLOGICAL SUPPLIES) IMPLANT
CATH FOLEY 2WAY SLVR  5CC 22FR (CATHETERS)
CATH FOLEY 2WAY SLVR 5CC 22FR (CATHETERS) IMPLANT
CATH INTERMIT  6FR 70CM (CATHETERS) ×3 IMPLANT
CLOTH BEACON ORANGE TIMEOUT ST (SAFETY) ×3 IMPLANT
DECANTER SPIKE VIAL GLASS SM (MISCELLANEOUS) ×3 IMPLANT
EXTRACTOR STONE NITINOL NGAGE (UROLOGICAL SUPPLIES) ×3 IMPLANT
FIBER LASER FLEXIVA 365 (UROLOGICAL SUPPLIES) ×3 IMPLANT
GLOVE BIO SURGEON STRL SZ8 (GLOVE) ×3 IMPLANT
GLOVE BIOGEL M 7.0 STRL (GLOVE) ×9 IMPLANT
GLOVE BIOGEL PI IND STRL 7.0 (GLOVE) ×2 IMPLANT
GLOVE BIOGEL PI INDICATOR 7.0 (GLOVE) ×4
GOWN STRL REUS W/TWL LRG LVL3 (GOWN DISPOSABLE) ×3 IMPLANT
GOWN STRL REUS W/TWL XL LVL3 (GOWN DISPOSABLE) ×3 IMPLANT
GUIDEWIRE STR DUAL SENSOR (WIRE) ×3 IMPLANT
GUIDEWIRE STR ZIPWIRE 035X150 (MISCELLANEOUS) ×3 IMPLANT
IV NS IRRIG 3000ML ARTHROMATIC (IV SOLUTION) ×6 IMPLANT
KIT TURNOVER CYSTO (KITS) ×3 IMPLANT
MANIFOLD NEPTUNE II (INSTRUMENTS) ×3 IMPLANT
PACK CYSTO (CUSTOM PROCEDURE TRAY) ×3 IMPLANT
PAD ARMBOARD 7.5X6 YLW CONV (MISCELLANEOUS) ×3 IMPLANT
SHEATH URETERAL 12FRX35CM (MISCELLANEOUS) ×3 IMPLANT
STENT URET 6FRX24 CONTOUR (STENTS) ×3 IMPLANT
STENT URET 6FRX26 CONTOUR (STENTS) IMPLANT
SYR 10ML LL (SYRINGE) ×3 IMPLANT
TOWEL OR 17X26 4PK STRL BLUE (TOWEL DISPOSABLE) ×3 IMPLANT

## 2020-04-17 NOTE — H&P (Signed)
H&P  Chief Complaint: Kidney stone  History of Present Illness: Jessica Ward is a 29 y.o. year old female who presents at this time for ureteroscopic management of a 1.4 cm left distal ureteral stone.  Initial presentation for this back in April 2020.  It has been progressive since that time and more symptomatic.  I counseled the patient regarding treatment options including shockwave lithotripsy and ureteroscopy.  As this is quite distal, we are performing ureteroscopy.  The procedure as well as risks and complications of this have been discussed including but not limited to ureteral injury, infection, bleeding, the need for stent, as well as anesthetic complications.  She understands and desires to proceed.  Past Medical History:  Diagnosis Date  . High cholesterol   . Kidney stones   . Miscarriage   . Normal delivery 10/27/2012    Past Surgical History:  Procedure Laterality Date  . NO PAST SURGERIES    . TUBAL LIGATION Bilateral 03/23/2018   Procedure: BILATERAL TUBAL LIGATION WITH FALLOPE RINGS;  Surgeon: Tilda Burrow, MD;  Location: [facility];  Service: Gynecology;  Laterality: Bilateral;  . UMBILICAL HERNIA REPAIR N/A 03/23/2018   Procedure: HERNIA REPAIR UMBILICAL ADULT;  Surgeon: Tilda Burrow, MD;  Location: [facility];  Service: Gynecology;  Laterality: N/A;    Home Medications:  Medications Prior to Admission  Medication Sig Dispense Refill  . atorvastatin (LIPITOR) 10 MG tablet Take 10 mg by mouth daily.    Marland Kitchen escitalopram (LEXAPRO) 20 MG tablet Take 20 mg by mouth daily.    Marland Kitchen HYDROcodone-acetaminophen (NORCO/VICODIN) 5-325 MG tablet Take 2 tablets by mouth every 4 (four) hours as needed. 25 tablet 0  . ondansetron (ZOFRAN ODT) 8 MG disintegrating tablet Take 1 tablet (8 mg total) by mouth every 8 (eight) hours as needed for nausea. 20 tablet 0  . acetaminophen (TYLENOL) 500 MG tablet Take 1,000 mg by mouth every 6 (six) hours as needed (for pain/headaches.). (Patient not taking:  Reported on 04/11/2020)    . fluconazole (DIFLUCAN) 150 MG tablet Take 1 each month before period (Patient taking differently: Take 150 mg by mouth every 30 (thirty) days. Prior to menstrual cycle) 6 tablet 2  . ibuprofen (ADVIL) 800 MG tablet Take 1 tablet (800 mg total) by mouth 3 (three) times daily. (Patient not taking: Reported on 04/11/2020) 21 tablet 0  . SUMAtriptan (IMITREX) 50 MG tablet TAKE 1 TO 2 TABELTS (50MG ) BY MOUTH AFTER ONSET OF MIGRAINE. MAY REPEAT AFTER 2 HOURS IF HEADACHE RETURNS, NOT TO EXCEED 200MG  IN 24 HOURS. (Patient not taking: Reported on 04/11/2020)      Allergies: No Known Allergies  Family History  Problem Relation Age of Onset  . Cancer Other        leukemia, breast-maternal great grandma  . Hypertension Mother   . Diabetes Maternal Grandfather   . Coronary artery disease Maternal Grandfather   . Heart attack Maternal Grandfather   . Stroke Maternal Grandfather   . Hyperlipidemia Father   . Heart disease Father        MI 12/2014  . Heart attack Father   . Other Daughter        tubes in ears  . Other Daughter        tubes in ears  . Hypercholesterolemia Paternal Aunt     Social History:  reports that she has never smoked. She has never used smokeless tobacco. She reports that she does not drink alcohol and does not use drugs.  ROS: A complete review of systems was performed.  All systems are negative except for pertinent findings as noted.  Physical Exam:  Vital signs in last 24 hours: Temp:  [98.3 F (36.8 C)] 98.3 F (36.8 C) (06/29 1127) Pulse Rate:  [77] 77 (06/29 1127) Resp:  [17] 17 (06/29 1127) BP: (112)/(69) 112/69 (06/29 1127) SpO2:  [98 %] 98 % (06/29 1127) Weight:  [83 kg] 83 kg (06/29 1127) General:  Alert and oriented, No acute distress HEENT: Normocephalic, atraumatic Neck: No JVD or lymphadenopathy Cardiovascular: Regular rate  Lungs: Normal inspiratory/expiratory excursion Abdomen: Soft, nontender, nondistended, no abdominal  masses Back: No CVA tenderness Extremities: No edema Neurologic: Grossly intact  Laboratory Data:  No results found for this or any previous visit (from the past 24 hour(s)). Recent Results (from the past 240 hour(s))  SARS CORONAVIRUS 2 (TAT 6-24 HRS) Nasopharyngeal Nasopharyngeal Swab     Status: None   Collection Time: 04/16/20  7:59 AM   Specimen: Nasopharyngeal Swab  Result Value Ref Range Status   SARS Coronavirus 2 NEGATIVE NEGATIVE Final    Comment: (NOTE) SARS-CoV-2 target nucleic acids are NOT DETECTED.  The SARS-CoV-2 RNA is generally detectable in upper and lower respiratory specimens during the acute phase of infection. Negative results do not preclude SARS-CoV-2 infection, do not rule out co-infections with other pathogens, and should not be used as the sole basis for treatment or other patient management decisions. Negative results must be combined with clinical observations, patient history, and epidemiological information. The expected result is Negative.  Fact Sheet for Patients: HairSlick.no  Fact Sheet for Healthcare Providers: quierodirigir.com  This test is not yet approved or cleared by the [place] FDA and  has been authorized for detection and/or diagnosis of SARS-CoV-2 by FDA under an Emergency Use Authorization (EUA). This EUA will remain  in effect (meaning this test can be used) for the duration of the COVID-19 declaration under Se ction 564(b)(1) of the Act, 21 U.S.C. section 360bbb-3(b)(1), unless the authorization is terminated or revoked sooner.  Performed at [facility], [address], [place], [place] [address]    Creatinine: No results for input(s): CREATININE in the last 168 hours.  Radiologic Imaging: No results found.  Impression/Assessment:  Left ureteral calculus, large  Plan:  Cystoscopy, left retrograde ureteropyelogram, left ureteroscopy, holmium laser  lithotripsy and extraction of stone with stent placement.  Bertram Millard Huzaifa Viney 04/17/2020, 12:26 PM  Bertram Millard. Kongmeng Santoro MD

## 2020-04-17 NOTE — Interval H&P Note (Signed)
History and Physical Interval Note:  04/17/2020 12:47 PM  Jessica Ward  has presented today for surgery, with the diagnosis of lett ureteral stone.  The various methods of treatment have been discussed with the patient and family. After consideration of risks, benefits and other options for treatment, the patient has consented to  Procedure(s): CYSTOSCOPY WITH RETROGRADE PYELOGRAM, URETEROSCOPY AND STENT PLACEMENT (Left) as a surgical intervention.  The patient's history has been reviewed, patient examined, no change in status, stable for surgery.  I have reviewed the patient's chart and labs.  Questions were answered to the patient's satisfaction.     [name]

## 2020-04-17 NOTE — Op Note (Signed)
Preoperative diagnosis: 14 mm left distal ureteral stone with hydronephrosis  Postoperative diagnosis: Same  Principal procedure: Cystoscopy, left retrograde ureteropyelogram, fluoroscopic interpretation, left ureteroscopy, holmium laser lithotripsy and extraction of left ureteral stone, placement of 6 French by 24 cm contour double-J stent with tether  Surgeon: Kemari Mares  Anesthesia: General with LMA  Complications: None  Specimen: Stone fragments  Estimated blood loss: None  Indications: 29 year old female with longstanding history of a progressing left ureteral stone.  I first saw her approximately 2 weeks ago.  She initially presented to the emergency room in April 2010.  She does have a large stone which has become more symptomatic recently and she is requested urgent management of this.  As the stone is significantly large, it is unlikely to pass anytime soon.  She presents for cystoscopy, retrograde, ureteroscopic management.  I discussed the procedure as well as risks and complications of this procedure including but not limited to infection, ureteral trauma, bleeding, needing stent placement, needing a second procedure as well as anesthetic complications.  She understands these and desires to proceed.  Findings: Bladder appeared normal with normal urothelium and normally placed and configured ureteral orifices.  Retrograde study of the left ureter revealed a filling defect in the distal ureter at the UVJ consistent with her stone.  There was significant hydroureteronephrosis with pyelocaliectasis.  No other filling defects were noted.  Description of procedure: The patient was properly identified and marked in the holding area and was taken to the operating room where general anesthetic was administered with the LMA.  She was placed in the dorsolithotomy position.  Genitalia and perineum were prepped and draped.  The patient received preoperative IV antibiotics.  21 French panendoscope  was advanced into the bladder with general inspection performed in the left retrograde study performed using Omnipaque with the above-mentioned findings.  6 [place] open-ended catheter was utilized for that, and then was used to assist with passage of a sensor tip guidewire by the stone and up into the left renal pelvis where a good curl seen.  The open-ended catheter and cystoscope were removed.  The stone was present right at the ureteral orifice and I did not need to dilate the orifice.  I then placed a 6 French semirigid ureteroscope up to the stone and using a 365 m fiber and with the laser set at 50 Hz and a power of 0.3, stone was fragmented into multiple smaller fragments.  Tiny ones were rinsed into the bladder.  Larger ones were grasped with the engage basket and extracted into the bladder.  Following treatment of the entire stone, inspection of the entire ureter with the ureteroscope revealed no further stone matter present.  The ureteroscope was removed.  Guidewire was backloaded through the 21 French cystoscope and I then passed the 24 cm x 6 French contour double-J stent.  Tether was left on.  Once it was positioned adequately, it was deployed by removing the guidewire with excellent proximal and distal curl seen.  The bladder was drained as well as a small stone fragments which were then saved.  The scope was removed.  The tether was tied right outside the urethra, trimmed, and then tucked into the vagina.  At this point the procedure was terminated.  The patient was awakened, taken to the PACU in stable condition, having tolerated the procedure well.v

## 2020-04-17 NOTE — Discharge Instructions (Signed)
1. You may see some blood in the urine and may have some burning with urination for 48-72 hours. You also may notice that you have to urinate more frequently or urgently after your procedure which is normal.  2. You should call should you develop an inability urinate, fever > 101, persistent nausea and vomiting that prevents you from eating or drinking to stay hydrated.  3. If you have a stent, you will likely urinate more frequently and urgently until the stent is removed and you may experience some discomfort/pain in the lower abdomen and flank especially when urinating. You may take pain medication prescribed to you if needed for pain. You may also intermittently have blood in the urine until the stent is removed. OK to pull thread to remove stent on Friday morning.

## 2020-04-17 NOTE — Anesthesia Procedure Notes (Signed)
Performed by: Darreld Hoffer P, CRNA       

## 2020-04-17 NOTE — Anesthesia Procedure Notes (Addendum)
Procedure Name: LMA Insertion Date/Time: 04/17/2020 1:30 PM Performed by: Earmon Phoenix, CRNA Pre-anesthesia Checklist: Patient identified, Patient being monitored, Emergency Drugs available, Timeout performed and Suction available Patient Re-evaluated:Patient Re-evaluated prior to induction Oxygen Delivery Method: Circle System Utilized Preoxygenation: Pre-oxygenation with 100% oxygen Induction Type: IV induction Ventilation: Mask ventilation without difficulty LMA: LMA inserted LMA Size: 4.0 Number of attempts: 1 Placement Confirmation: positive ETCO2 and breath sounds checked- equal and bilateral Tube secured with: Tape Dental Injury: Teeth and Oropharynx as per pre-operative assessment

## 2020-04-17 NOTE — Transfer of Care (Signed)
Immediate Anesthesia Transfer of Care Note  Patient: Marcha Licklider  Procedure(s) Performed: CYSTOSCOPY WITH RETROGRADE PYELOGRAM, URETEROSCOPY AND STENT PLACEMENT, LASER LITHOTRIPSY, STONE EXTRACTION AND DOUBLE j STENT PLACEMENT (Left Urethra)  Patient Location: PACU  Anesthesia Type:General  Level of Consciousness: awake, alert , oriented and patient cooperative  Airway & Oxygen Therapy: Patient Spontanous Breathing  Post-op Assessment: Report given to RN and Post -op Vital signs reviewed and stable  Post vital signs: Reviewed and stable  Last Vitals:  Vitals Value Taken Time  BP    Temp    Pulse 75 04/17/20 1426  Resp 15 04/17/20 1426  SpO2 98 % 04/17/20 1426  Vitals shown include unvalidated device data.  Last Pain:  Vitals:   04/17/20 1127  TempSrc: Oral  PainSc: 5       Patients Stated Pain Goal: 8 (04/17/20 1127)  Complications: No complications documented.

## 2020-04-17 NOTE — Anesthesia Preprocedure Evaluation (Signed)
Anesthesia Evaluation  Patient identified by MRN, date of birth, ID band Patient awake    Reviewed: Allergy & Precautions, H&P , NPO status , Patient's Chart, lab work & pertinent test results, reviewed documented beta blocker date and time   Airway Mallampati: II  TM Distance: >3 FB Neck ROM: full    Dental no notable dental hx.    Pulmonary neg pulmonary ROS,    Pulmonary exam normal breath sounds clear to auscultation       Cardiovascular Exercise Tolerance: Good negative cardio ROS   Rhythm:regular Rate:Normal     Neuro/Psych negative neurological ROS  negative psych ROS   GI/Hepatic negative GI ROS, Neg liver ROS,   Endo/Other  negative endocrine ROS  Renal/GU Renal disease  negative genitourinary   Musculoskeletal   Abdominal   Peds  Hematology negative hematology ROS (+)   Anesthesia Other Findings   Reproductive/Obstetrics negative OB ROS                             Anesthesia Physical Anesthesia Plan  ASA: II  Anesthesia Plan: General   Post-op Pain Management:    Induction:   PONV Risk Score and Plan:   Airway Management Planned:   Additional Equipment:   Intra-op Plan:   Post-operative Plan:   Informed Consent: I have reviewed the patients History and Physical, chart, labs and discussed the procedure including the risks, benefits and alternatives for the proposed anesthesia with the patient or authorized representative who has indicated his/her understanding and acceptance.     Dental Advisory Given  Plan Discussed with: CRNA  Anesthesia Plan Comments:         Anesthesia Quick Evaluation

## 2020-04-17 NOTE — Anesthesia Postprocedure Evaluation (Signed)
Anesthesia Post Note  Patient: Jessica Ward  Procedure(s) Performed: CYSTOSCOPY WITH RETROGRADE PYELOGRAM, URETEROSCOPY AND STENT PLACEMENT, LASER LITHOTRIPSY, STONE EXTRACTION AND DOUBLE j STENT PLACEMENT (Left Urethra)  Patient location during evaluation: PACU Anesthesia Type: General Level of consciousness: awake and alert Pain management: pain level controlled Vital Signs Assessment: post-procedure vital signs reviewed and stable Respiratory status: spontaneous breathing Cardiovascular status: stable Postop Assessment: no apparent nausea or vomiting Anesthetic complications: no   No complications documented.   Last Vitals:  Vitals:   04/17/20 1127  BP: 112/69  Pulse: 77  Resp: 17  Temp: 36.8 C  SpO2: 98%    Last Pain:  Vitals:   04/17/20 1127  TempSrc: Oral  PainSc: 5                  Edison Pace

## 2020-04-18 ENCOUNTER — Encounter (HOSPITAL_COMMUNITY): Payer: Self-pay | Admitting: Urology

## 2020-04-26 ENCOUNTER — Telehealth: Payer: Self-pay | Admitting: Urology

## 2020-04-26 ENCOUNTER — Other Ambulatory Visit: Payer: Self-pay

## 2020-04-26 DIAGNOSIS — N201 Calculus of ureter: Secondary | ICD-10-CM

## 2020-04-26 NOTE — Telephone Encounter (Signed)
I placed the order.

## 2020-04-26 NOTE — Telephone Encounter (Signed)
Pt states she is suppose to have US Renal before next appt and no one has called her. I dont see an order in. Can you check behind me?

## 2020-04-30 ENCOUNTER — Encounter (HOSPITAL_BASED_OUTPATIENT_CLINIC_OR_DEPARTMENT_OTHER): Payer: Self-pay

## 2020-04-30 ENCOUNTER — Ambulatory Visit (HOSPITAL_BASED_OUTPATIENT_CLINIC_OR_DEPARTMENT_OTHER): Admit: 2020-04-30 | Payer: Medicaid Other | Admitting: Urology

## 2020-04-30 SURGERY — CYSTOURETEROSCOPY, WITH RETROGRADE PYELOGRAM AND STENT INSERTION
Anesthesia: General | Laterality: Left

## 2020-05-04 ENCOUNTER — Ambulatory Visit (HOSPITAL_COMMUNITY): Admission: RE | Admit: 2020-05-04 | Payer: BC Managed Care – PPO | Source: Ambulatory Visit

## 2020-05-15 ENCOUNTER — Ambulatory Visit: Payer: Medicaid Other | Admitting: Urology

## 2020-05-15 DIAGNOSIS — Z6832 Body mass index (BMI) 32.0-32.9, adult: Secondary | ICD-10-CM | POA: Diagnosis not present

## 2020-05-15 DIAGNOSIS — E6609 Other obesity due to excess calories: Secondary | ICD-10-CM | POA: Diagnosis not present

## 2020-05-15 DIAGNOSIS — E7849 Other hyperlipidemia: Secondary | ICD-10-CM | POA: Diagnosis not present

## 2020-05-21 DIAGNOSIS — Z20822 Contact with and (suspected) exposure to covid-19: Secondary | ICD-10-CM | POA: Diagnosis not present

## 2020-05-25 ENCOUNTER — Telehealth: Payer: Self-pay

## 2020-05-25 ENCOUNTER — Other Ambulatory Visit: Payer: Self-pay

## 2020-05-25 ENCOUNTER — Ambulatory Visit (HOSPITAL_COMMUNITY)
Admission: RE | Admit: 2020-05-25 | Discharge: 2020-05-25 | Disposition: A | Discharge status: Home / Self Care | Payer: BC Managed Care – PPO | Source: Ambulatory Visit | Attending: Urology | Admitting: Urology

## 2020-05-25 DIAGNOSIS — N201 Calculus of ureter: Secondary | ICD-10-CM

## 2020-05-25 DIAGNOSIS — Z9889 Other specified postprocedural states: Secondary | ICD-10-CM | POA: Diagnosis not present

## 2020-05-25 DIAGNOSIS — Z466 Encounter for fitting and adjustment of urinary device: Secondary | ICD-10-CM | POA: Diagnosis not present

## 2020-05-25 NOTE — Telephone Encounter (Signed)
Notified pt of result of Renal US. Pt asked if she was to have a follow up appt. I said there was none per Dr. In notes. If she had problems again to call.

## 2020-05-25 NOTE — Telephone Encounter (Signed)
-----   Message from Marcine Matar, MD sent at 05/25/2020  2:10 PM EDT -----  Notify patient that her ultrasound looks normal. ----- Message ----- From: Gustavus Messing, LPN Sent: 3/0/9407   9:36 AM EDT To: Marcine Matar, MD  Please review

## 2020-06-27 DIAGNOSIS — Z0000 Encounter for general adult medical examination without abnormal findings: Secondary | ICD-10-CM | POA: Diagnosis not present

## 2020-07-03 DIAGNOSIS — D225 Melanocytic nevi of trunk: Secondary | ICD-10-CM | POA: Diagnosis not present

## 2020-07-16 DIAGNOSIS — Z23 Encounter for immunization: Secondary | ICD-10-CM | POA: Diagnosis not present

## 2020-07-23 DIAGNOSIS — J22 Unspecified acute lower respiratory infection: Secondary | ICD-10-CM | POA: Diagnosis not present

## 2020-08-10 ENCOUNTER — Encounter (HOSPITAL_COMMUNITY): Payer: Self-pay | Admitting: *Deleted

## 2020-08-10 ENCOUNTER — Other Ambulatory Visit: Payer: Self-pay

## 2020-08-10 ENCOUNTER — Emergency Department (HOSPITAL_COMMUNITY)
Admission: EM | Admit: 2020-08-10 | Discharge: 2020-08-10 | Disposition: A | Discharge status: Home / Self Care | Payer: BC Managed Care – PPO | Attending: Emergency Medicine | Admitting: Emergency Medicine

## 2020-08-10 DIAGNOSIS — R197 Diarrhea, unspecified: Secondary | ICD-10-CM | POA: Insufficient documentation

## 2020-08-10 DIAGNOSIS — R110 Nausea: Secondary | ICD-10-CM | POA: Insufficient documentation

## 2020-08-10 DIAGNOSIS — Z681 Body mass index (BMI) 19 or less, adult: Secondary | ICD-10-CM | POA: Diagnosis not present

## 2020-08-10 DIAGNOSIS — R1031 Right lower quadrant pain: Secondary | ICD-10-CM | POA: Insufficient documentation

## 2020-08-10 DIAGNOSIS — Z20822 Contact with and (suspected) exposure to covid-19: Secondary | ICD-10-CM | POA: Insufficient documentation

## 2020-08-10 DIAGNOSIS — R1011 Right upper quadrant pain: Secondary | ICD-10-CM

## 2020-08-10 LAB — CBC
HCT: 41.8 % (ref 36.0–46.0)
Hemoglobin: 13.9 g/dL (ref 12.0–15.0)
MCH: 31 pg (ref 26.0–34.0)
MCHC: 33.3 g/dL (ref 30.0–36.0)
MCV: 93.3 fL (ref 80.0–100.0)
Platelets: 246 10*3/uL (ref 150–400)
RBC: 4.48 MIL/uL (ref 3.87–5.11)
RDW: 12.8 % (ref 11.5–15.5)
WBC: 6.5 10*3/uL (ref 4.0–10.5)
nRBC: 0 % (ref 0.0–0.2)

## 2020-08-10 LAB — COMPREHENSIVE METABOLIC PANEL
ALT: 15 U/L (ref 0–44)
AST: 14 U/L — ABNORMAL LOW (ref 15–41)
Albumin: 4.4 g/dL (ref 3.5–5.0)
Alkaline Phosphatase: 54 U/L (ref 38–126)
Anion gap: 9 (ref 5–15)
BUN: 12 mg/dL (ref 6–20)
CO2: 24 mmol/L (ref 22–32)
Calcium: 8.9 mg/dL (ref 8.9–10.3)
Chloride: 104 mmol/L (ref 98–111)
Creatinine, Ser: 0.56 mg/dL (ref 0.44–1.00)
GFR, Estimated: >60 mL/min (ref >60)
Glucose, Bld: 86 mg/dL (ref 70–99)
Potassium: 3.5 mmol/L (ref 3.5–5.1)
Sodium: 137 mmol/L (ref 135–145)
Total Bilirubin: 0.6 mg/dL (ref 0.3–1.2)
Total Protein: 7.6 g/dL (ref 6.5–8.1)

## 2020-08-10 LAB — URINALYSIS, ROUTINE W REFLEX MICROSCOPIC
Bilirubin Urine: NEGATIVE
Glucose, UA: NEGATIVE mg/dL
Hgb urine dipstick: NEGATIVE
Ketones, ur: NEGATIVE mg/dL
Leukocytes,Ua: NEGATIVE
Nitrite: NEGATIVE
Protein, ur: NEGATIVE mg/dL
Specific Gravity, Urine: 1.017 (ref 1.005–1.030)
pH: 7 (ref 5.0–8.0)

## 2020-08-10 LAB — LIPASE, BLOOD: Lipase: 28 U/L (ref 11–51)

## 2020-08-10 LAB — RESPIRATORY PANEL BY RT PCR (FLU A&B, COVID)
Influenza A by PCR: NEGATIVE
Influenza B by PCR: NEGATIVE
SARS Coronavirus 2 by RT PCR: NEGATIVE

## 2020-08-10 LAB — PREGNANCY, URINE: Preg Test, Ur: NEGATIVE

## 2020-08-10 MED ORDER — PANTOPRAZOLE SODIUM 40 MG PO TBEC
40.0000 mg | DELAYED_RELEASE_TABLET | Freq: Every day | ORAL | 0 refills | Status: DC
Start: 2020-08-10 — End: 2022-03-21

## 2020-08-10 NOTE — ED Triage Notes (Signed)
Right lower quadrant pain x 3 days with [facility]a

## 2020-08-10 NOTE — ED Notes (Signed)
Pt has [place] appt at 0900

## 2020-08-10 NOTE — ED Notes (Addendum)
Per pt  abd pain x 3 days  "Virtual visit"  withPCP and sent to ED for eval of abd pain of 2 months

## 2020-08-10 NOTE — Discharge Instructions (Addendum)
You have been scheduled to return here tomorrow morning for an ultrasound of your abdomen.  Your appointment is 9 AM.  Please arrive at least 15 minutes early and nothing to eat or drink after midnight tonight.  Start the prescription Protonix tomorrow.  Try to avoid spicy greasy food and alcohol.

## 2020-08-11 ENCOUNTER — Telehealth (HOSPITAL_COMMUNITY): Payer: Self-pay | Admitting: Emergency Medicine

## 2020-08-11 ENCOUNTER — Emergency Department (HOSPITAL_COMMUNITY)
Admit: 2020-08-11 | Discharge: 2020-08-11 | Disposition: A | Discharge status: Home / Self Care | Payer: BC Managed Care – PPO | Attending: Emergency Medicine | Admitting: Emergency Medicine

## 2020-08-11 ENCOUNTER — Other Ambulatory Visit: Payer: Self-pay

## 2020-08-11 DIAGNOSIS — R1011 Right upper quadrant pain: Secondary | ICD-10-CM | POA: Diagnosis not present

## 2020-08-11 NOTE — ED Provider Notes (Signed)
This patient has some upper abdominal discomfort, labs are unremarkable and the ultrasound as performed this morning is normal.  She is stable for discharge with a nonsurgical abdomen, has been started on proton pump inhibitor, stable for discharge   Eber Hong, MD 08/11/20 1025

## 2020-08-13 NOTE — Telephone Encounter (Signed)
Patient was given results - agreeable to plan

## 2020-08-30 DIAGNOSIS — Z23 Encounter for immunization: Secondary | ICD-10-CM | POA: Diagnosis not present

## 2020-09-19 DIAGNOSIS — Z1152 Encounter for screening for COVID-19: Secondary | ICD-10-CM | POA: Diagnosis not present

## 2020-09-20 ENCOUNTER — Other Ambulatory Visit: Payer: BC Managed Care – PPO

## 2020-09-20 DIAGNOSIS — Z20822 Contact with and (suspected) exposure to covid-19: Secondary | ICD-10-CM | POA: Diagnosis not present

## 2020-09-21 LAB — SARS-COV-2, NAA 2 DAY TAT

## 2020-09-21 LAB — NOVEL CORONAVIRUS, NAA: SARS-CoV-2, NAA: NOT DETECTED

## 2020-10-18 NOTE — ED Provider Notes (Signed)
[facility] EMERGENCY DEPARTMENT Provider Note   CSN: [id number] Arrival date & time: 08/10/20  1335     History Chief Complaint  Patient presents with  . Abdominal Pain    Jessica Ward is a 29 y.o. female.  HPI     Jessica Ward is a 29 y.o. female who presents to the Emergency Department complaining of upper abdominal pain for 3 days.  She describes a dull pain that is associated with nausea and intermittent loose stools.  Pain is non radiating.  She denies fever, vomiting, black or bloody stools.  No dysuria or flank pain    Past Medical History:  Diagnosis Date  . High cholesterol   . Kidney stones   . Miscarriage   . Normal delivery 10/27/2012    Patient Active Problem List   Diagnosis Date Noted  . Dyspareunia in female 03/29/2019  . Screening examination for STD (sexually transmitted disease) 03/29/2019  . Vaginal itching 03/29/2019  . Umbilical hernia reducible 03/23/2018  . Flu 11/30/2017  . Supervision of normal pregnancy 07/01/2017  . Spontaneous abortion without complication 12/22/2016  . Rubella non-immune status, antepartum 02/05/2015  . Sterilization consult 08/22/2014    Past Surgical History:  Procedure Laterality Date  . CYSTOSCOPY WITH RETROGRADE PYELOGRAM, URETEROSCOPY AND STENT PLACEMENT Left 04/17/2020   Procedure: CYSTOSCOPY WITH RETROGRADE PYELOGRAM, URETEROSCOPY AND STENT PLACEMENT, LASER LITHOTRIPSY, STONE EXTRACTION AND DOUBLE j STENT PLACEMENT;  Surgeon: Marcine Matar, MD;  Location: [facility];  Service: Urology;  Laterality: Left;  . NO PAST SURGERIES    . TUBAL LIGATION Bilateral 03/23/2018   Procedure: BILATERAL TUBAL LIGATION WITH FALLOPE RINGS;  Surgeon: Tilda Burrow, MD;  Location: [facility];  Service: Gynecology;  Laterality: Bilateral;  . UMBILICAL HERNIA REPAIR N/A 03/23/2018   Procedure: HERNIA REPAIR UMBILICAL ADULT;  Surgeon: Tilda Burrow, MD;  Location: [facility];  Service: Gynecology;  Laterality: N/A;     OB History     Gravida  5   Para  4   Term  4   Preterm      AB  1   Living  4     SAB  1   IAB      Ectopic      Multiple  0   Live Births  4           Family History  Problem Relation Age of Onset  . Cancer Other        leukemia, breast-maternal great grandma  . Hypertension Mother   . Diabetes Maternal Grandfather   . Coronary artery disease Maternal Grandfather   . Heart attack Maternal Grandfather   . Stroke Maternal Grandfather   . Hyperlipidemia Father   . Heart disease Father        MI 12/2014  . Heart attack Father   . Other Daughter        tubes in ears  . Other Daughter        tubes in ears  . Hypercholesterolemia Paternal Aunt     Social History   Tobacco Use  . Smoking status: Never Smoker  . Smokeless tobacco: Never Used  Vaping Use  . Vaping Use: Never used  Substance Use Topics  . Alcohol use: No  . Drug use: No    Home Medications Prior to Admission medications   Medication Sig Start Date End Date Taking? Authorizing Provider  acetaminophen (TYLENOL) 500 MG tablet Take 1,000 mg by mouth every 6 (six) hours as needed (  for pain/headaches.). Patient not taking: Reported on 04/11/2020    [provider]  atorvastatin (LIPITOR) 10 MG tablet Take 10 mg by mouth daily. 03/30/20   [provider]  cephALEXin (KEFLEX) 500 MG capsule Take 1 capsule (500 mg total) by mouth 2 (two) times daily. 04/17/20   Marcine Matar, MD  escitalopram (LEXAPRO) 20 MG tablet Take 20 mg by mouth daily. 03/29/20   [provider]  fluconazole (DIFLUCAN) 150 MG tablet Take 1 each month before period Patient taking differently: Take 150 mg by mouth every 30 (thirty) days. Prior to menstrual cycle 11/29/19   Adline Potter, NP  HYDROcodone-acetaminophen (NORCO/VICODIN) 5-325 MG tablet Take 2 tablets by mouth every 4 (four) hours as needed. 04/09/20   Marcine Matar, MD  ibuprofen (ADVIL) 800 MG tablet Take 1 tablet (800 mg total) by mouth 3  (three) times daily. Patient not taking: Reported on 04/11/2020 10/27/19   Durward Parcel, FNP  ondansetron (ZOFRAN ODT) 8 MG disintegrating tablet Take 1 tablet (8 mg total) by mouth every 8 (eight) hours as needed for nausea. 04/02/20   Derwood Kaplan, MD  pantoprazole (PROTONIX) 40 MG tablet Take 1 tablet (40 mg total) by mouth daily. 08/10/20   Beata Beason, PA-C  SUMAtriptan (IMITREX) 50 MG tablet TAKE 1 TO 2 TABELTS (50MG ) BY MOUTH AFTER ONSET OF MIGRAINE. MAY REPEAT AFTER 2 HOURS IF HEADACHE RETURNS, NOT TO EXCEED 200MG  IN 24 HOURS. Patient not taking: Reported on 04/11/2020 03/29/20   [provider]    Allergies    Patient has no known allergies.  Review of Systems   Review of Systems  Constitutional: Negative for appetite change, chills and fever.  Respiratory: Negative for chest tightness and shortness of breath.   Cardiovascular: Negative for chest pain.  Gastrointestinal: Positive for abdominal pain and nausea. Negative for blood in stool and vomiting.       Loose stools  Genitourinary: Negative for decreased urine volume, difficulty urinating, dysuria, flank pain, vaginal bleeding and vaginal discharge.  Musculoskeletal: Negative for back pain.  Skin: Negative for rash.  Neurological: Negative for weakness and numbness.  Hematological: Negative for adenopathy.    Physical Exam Updated Vital Signs BP 117/72 (BP Location: Right Arm)   Pulse 67   Temp 99.1 F (37.3 C) (Oral)   Resp 16   Ht 5\' 4"  (1.626 m)   Wt 84.4 kg   SpO2 100%   BMI 31.93 kg/m   Physical Exam Vitals and nursing note reviewed.  Constitutional:      General: She is not in acute distress.    Appearance: Normal appearance. She is well-developed and well-nourished. She is not ill-appearing or toxic-appearing.  HENT:     Mouth/Throat:     Mouth: Oropharynx is clear and moist.  Cardiovascular:     Rate and Rhythm: Normal rate and regular rhythm.     Pulses: Intact distal pulses.      Heart sounds: Normal heart sounds. No murmur heard.   Pulmonary:     Effort: Pulmonary effort is normal. No respiratory distress.     Breath sounds: Normal breath sounds.  Abdominal:     General: There is no distension.     Palpations: Abdomen is soft. There is no mass.     Tenderness: There is abdominal tenderness (ttp of the right upper quadrant). There is no right CVA tenderness, left CVA tenderness, guarding or rebound.  Musculoskeletal:        General: No edema. Normal  range of motion.  Skin:    General: Skin is warm.     Findings: No rash.  Neurological:     General: No focal deficit present.     Mental Status: She is alert and oriented to person, place, and time.     Motor: No weakness or abnormal muscle tone.     Coordination: Coordination normal.     ED Results / Procedures / Treatments   Labs (all labs ordered are listed, but only abnormal results are displayed) Labs Reviewed  COMPREHENSIVE METABOLIC PANEL - Abnormal; Notable for the following components:      Result Value   AST 14 (*)    All other components within normal limits  URINALYSIS, ROUTINE W REFLEX MICROSCOPIC - Abnormal; Notable for the following components:   APPearance CLOUDY (*)    All other components within normal limits  RESPIRATORY PANEL BY RT PCR (FLU A&B, COVID)  LIPASE, BLOOD  CBC  PREGNANCY, URINE    EKG None  Radiology No results found.  Procedures Procedures (including critical care time)  Medications Ordered in ED Medications - No data to display  ED Course  I have reviewed the triage vital signs and the nursing notes.  Pertinent labs & imaging results that were available during my care of the patient were reviewed by me and considered in my medical decision making (see chart for details).    MDM Rules/Calculators/A&P                          Pt here with right upper abdominal pain associated with nausea and loose stool, sx's for 2 days.  vitals reviewed, no fever.     Labs unremarkable.  covid negative.  Exam concerning for possible gall bladder disease. Doubt acute abdomen.  [place] unavailable after hours, I feel that she is appropriate to return in 24 hours for out patient [place] of abdomen.  Pt agrees to plan, return precautions discussed.  Will start pt on PPI    Final Clinical Impression(s) / ED Diagnoses Final diagnoses:  Right upper quadrant abdominal pain    Rx / DC Orders ED Discharge Orders         Ordered    US Abdomen Limited RUQ/Gall Gladder        08/10/20 1931    pantoprazole (PROTONIX) 40 MG tablet  Daily        08/10/20 1944           Pauline Aus, PA-C 10/18/20 2200    Eber Hong, MD 10/19/20 2015

## 2020-11-13 DIAGNOSIS — E7849 Other hyperlipidemia: Secondary | ICD-10-CM | POA: Diagnosis not present

## 2020-11-13 DIAGNOSIS — E6609 Other obesity due to excess calories: Secondary | ICD-10-CM | POA: Diagnosis not present

## 2020-11-13 DIAGNOSIS — Z6833 Body mass index (BMI) 33.0-33.9, adult: Secondary | ICD-10-CM | POA: Diagnosis not present

## 2020-11-13 DIAGNOSIS — E782 Mixed hyperlipidemia: Secondary | ICD-10-CM | POA: Diagnosis not present

## 2020-11-13 DIAGNOSIS — G44209 Tension-type headache, unspecified, not intractable: Secondary | ICD-10-CM | POA: Diagnosis not present

## 2021-03-20 DIAGNOSIS — S8392XA Sprain of unspecified site of left knee, initial encounter: Secondary | ICD-10-CM | POA: Diagnosis not present

## 2021-03-20 DIAGNOSIS — Z681 Body mass index (BMI) 19 or less, adult: Secondary | ICD-10-CM | POA: Diagnosis not present

## 2021-03-25 ENCOUNTER — Ambulatory Visit (HOSPITAL_COMMUNITY)
Admission: RE | Admit: 2021-03-25 | Discharge: 2021-03-25 | Disposition: A | Discharge status: Home / Self Care | Payer: No Typology Code available for payment source | Source: Ambulatory Visit | Attending: Family Medicine | Admitting: Family Medicine

## 2021-03-25 ENCOUNTER — Other Ambulatory Visit: Payer: Self-pay

## 2021-03-25 ENCOUNTER — Other Ambulatory Visit (HOSPITAL_COMMUNITY): Payer: Self-pay | Admitting: Family Medicine

## 2021-03-25 DIAGNOSIS — S8392XA Sprain of unspecified site of left knee, initial encounter: Secondary | ICD-10-CM | POA: Insufficient documentation

## 2021-04-08 DIAGNOSIS — N390 Urinary tract infection, site not specified: Secondary | ICD-10-CM | POA: Diagnosis not present

## 2021-04-12 ENCOUNTER — Ambulatory Visit (INDEPENDENT_AMBULATORY_CARE_PROVIDER_SITE_OTHER): Payer: No Typology Code available for payment source

## 2021-04-12 ENCOUNTER — Encounter: Payer: Self-pay | Admitting: Emergency Medicine

## 2021-04-12 ENCOUNTER — Ambulatory Visit
Admission: EM | Admit: 2021-04-12 | Discharge: 2021-04-12 | Disposition: A | Discharge status: Home / Self Care | Payer: No Typology Code available for payment source | Attending: Emergency Medicine | Admitting: Emergency Medicine

## 2021-04-12 DIAGNOSIS — M25572 Pain in left ankle and joints of left foot: Secondary | ICD-10-CM | POA: Diagnosis not present

## 2021-04-12 DIAGNOSIS — W172XXA Fall into hole, initial encounter: Secondary | ICD-10-CM

## 2021-04-12 DIAGNOSIS — S93402A Sprain of unspecified ligament of left ankle, initial encounter: Secondary | ICD-10-CM

## 2021-04-12 MED ORDER — MELOXICAM 15 MG PO TABS
15.0000 mg | ORAL_TABLET | Freq: Every day | ORAL | 0 refills | Status: DC
Start: 2021-04-12 — End: 2022-03-21

## 2021-04-12 MED ORDER — IBUPROFEN 800 MG PO TABS
800.0000 mg | ORAL_TABLET | Freq: Once | ORAL | Status: AC
  Administered 2021-04-12: 800 mg via ORAL

## 2021-04-12 NOTE — Discharge Instructions (Addendum)
X-rays negative Continue conservative management of rest, ice, and elevation Ace applied Mobic for pain Follow up with PCP if symptoms persist Return or go to the ER if you have any new or worsening symptoms (fever, chills, chest pain, redness, swelling, bruising, deformity, etc...)

## 2021-04-12 NOTE — ED Triage Notes (Signed)
LT ankle pain and swelling after stepping in a hole in her yard today

## 2021-04-12 NOTE — ED Provider Notes (Signed)
[facility]   [id number] 04/12/21 Arrival Time: 1856  CC:LT ankle PAIN  SUBJECTIVE: History from: patient. Jessica Ward is a 30 y.o. female complains of LT ankle pain and injury that occurred earlier today.  Fell and twisted ankle in hole.  Localizes the pain to the outside of ankle.  Describes the pain as intermittent.  Has NOT tried OTC medications.  Symptoms are made worse with walking.  Denies similar symptoms in the past.  Complains of swelling.  Denies fever, chills, erythema, ecchymosis, weakness, numbness and tingling    ROS: As per HPI.  All other pertinent ROS negative.     Past Medical History:  Diagnosis Date   High cholesterol    Kidney stones    Miscarriage    Normal delivery 10/27/2012   Past Surgical History:  Procedure Laterality Date   CYSTOSCOPY WITH RETROGRADE PYELOGRAM, URETEROSCOPY AND STENT PLACEMENT Left 04/17/2020   Procedure: CYSTOSCOPY WITH RETROGRADE PYELOGRAM, URETEROSCOPY AND STENT PLACEMENT, LASER LITHOTRIPSY, STONE EXTRACTION AND DOUBLE j STENT PLACEMENT;  Surgeon: Marcine Matar, MD;  Location: [facility];  Service: Urology;  Laterality: Left;   NO PAST SURGERIES     TUBAL LIGATION Bilateral 03/23/2018   Procedure: BILATERAL TUBAL LIGATION WITH FALLOPE RINGS;  Surgeon: Tilda Burrow, MD;  Location: [facility];  Service: Gynecology;  Laterality: Bilateral;   UMBILICAL HERNIA REPAIR N/A 03/23/2018   Procedure: HERNIA REPAIR UMBILICAL ADULT;  Surgeon: Tilda Burrow, MD;  Location: [facility];  Service: Gynecology;  Laterality: N/A;   No Known Allergies No current facility-administered medications on file prior to encounter.   Current Outpatient Medications on File Prior to Encounter  Medication Sig Dispense Refill   atorvastatin (LIPITOR) 10 MG tablet Take 10 mg by mouth daily.     escitalopram (LEXAPRO) 20 MG tablet Take 20 mg by mouth daily.     fluconazole (DIFLUCAN) 150 MG tablet Take 1 each month before period (Patient taking differently: Take  150 mg by mouth every 30 (thirty) days. Prior to menstrual cycle) 6 tablet 2   pantoprazole (PROTONIX) 40 MG tablet Take 1 tablet (40 mg total) by mouth daily. 14 tablet 0   SUMAtriptan (IMITREX) 50 MG tablet TAKE 1 TO 2 TABELTS (50MG ) BY MOUTH AFTER ONSET OF MIGRAINE. MAY REPEAT AFTER 2 HOURS IF HEADACHE RETURNS, NOT TO EXCEED 200MG  IN 24 HOURS. (Patient not taking: Reported on 04/11/2020)     Social History   Socioeconomic History   Marital status: Married    Spouse name: Not on file   Number of children: 4   Years of education: Not on file   Highest education level: Not on file  Occupational History   Occupation: Homemaker   Occupation:  Tobacco Use   Smoking status: Never   Smokeless tobacco: Never  Vaping Use   Vaping Use: Never used  Substance and Sexual Activity   Alcohol use: No   Drug use: No   Sexual activity: Yes    Birth control/protection: Surgical  Other Topics Concern   Not on file  Social History Narrative   Patient lives with her husband, 2 daughters, and 1 step-daughter. She is a stay-at-home mom, husband works in 04/13/2020. Husband is a smoker.   Social Determinants of Health   Financial Resource Strain: Not on file  Food Insecurity: Not on file  Transportation Needs: Not on file  Physical Activity: Not on file  Stress: Not on file  Social Connections: Not on file  Intimate Partner Violence: Not on file   Family History  Problem Relation Age of Onset   Cancer Other        leukemia, breast-maternal great grandma   Hypertension Mother    Diabetes Maternal Grandfather    Coronary artery disease Maternal Grandfather    Heart attack Maternal Grandfather    Stroke Maternal Grandfather    Hyperlipidemia Father    Heart disease Father        MI 12/2014   Heart attack Father    Other Daughter        tubes in ears   Other Daughter        tubes in ears   Hypercholesterolemia Paternal Aunt      OBJECTIVE:  Vitals:   04/12/21 1912  BP: 106/73  Pulse: 82  Resp: 18  Temp: 98.3 F (36.8 C)  TempSrc: Oral  SpO2: 97%    General appearance: ALERT; in no acute distress.  Head: NCAT Lungs: Normal respiratory effort CV: Dorsalis pedis pulse 2+ Musculoskeletal: LT ankle Inspection: Swelling Palpation: TTP over distal fibula and lateral ankle ROM: LROM Strength: decreased Skin: warm and dry Neurologic: Ambulates with difficulty Psychological: alert and cooperative; normal mood and affect  DIAGNOSTIC STUDIES:  DG Ankle Complete Left  Result Date: 04/12/2021 CLINICAL DATA:  Ankle pain stepped in a hole EXAM: LEFT ANKLE COMPLETE - 3+ VIEW COMPARISON:  10/27/2019 FINDINGS: There is no evidence of fracture, dislocation, or joint effusion. There is no evidence of arthropathy or other focal bone abnormality. Soft tissues are unremarkable. IMPRESSION: Negative. Electronically Signed   By: Jasmine Pang M.D.   On: 04/12/2021 19:40     ASSESSMENT & PLAN:  1. Acute left ankle pain   2. Sprain of left ankle, unspecified ligament, initial encounter    Meds ordered this encounter  Medications   ibuprofen (ADVIL) tablet 800 mg   meloxicam (MOBIC) 15 MG tablet    Sig: Take 1 tablet (15 mg total) by mouth daily.    Dispense:  30 tablet    Refill:  0    Order Specific Question:   Supervising Provider    Answer:   Eustace Moore [[id number]]   X-rays negative Continue conservative management of rest, ice, and elevation Ace applied Mobic for pain Follow up with PCP if symptoms persist Return or go to the ER if you have any new or worsening symptoms (fever, chills, chest pain, redness, swelling, bruising, deformity, etc...)   Reviewed expectations re: course of current medical issues. Questions answered. Outlined signs and symptoms indicating need for more acute intervention. Patient verbalized understanding. After Visit Summary given.     Rennis Harding, PA-C 04/12/21  1954

## 2021-04-14 IMAGING — US US RENAL
1 series · 14 of 25 positions shown · non-contrast
Comparison: Renal CT of 02/05/2019

CLINICAL DATA: LEFT flank pain

EXAM:
RENAL / URINARY TRACT ULTRASOUND COMPLETE

[Series 1: us renal · 14 of 42 slices shown]
[im 1/42]
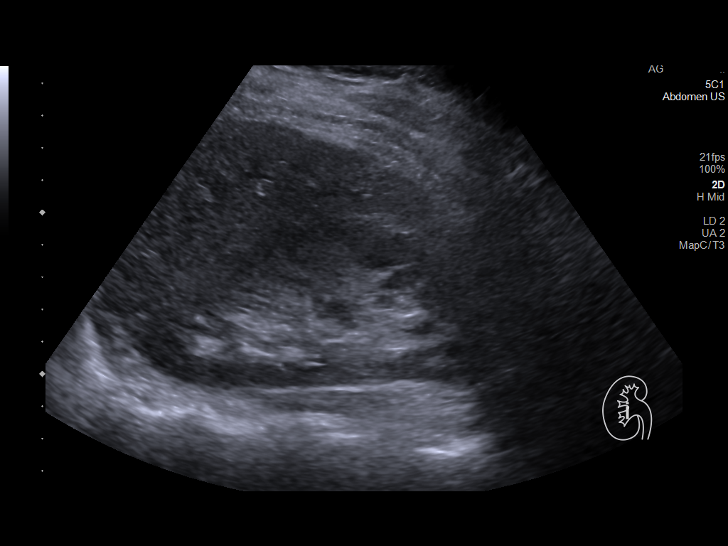
[im 4/42]
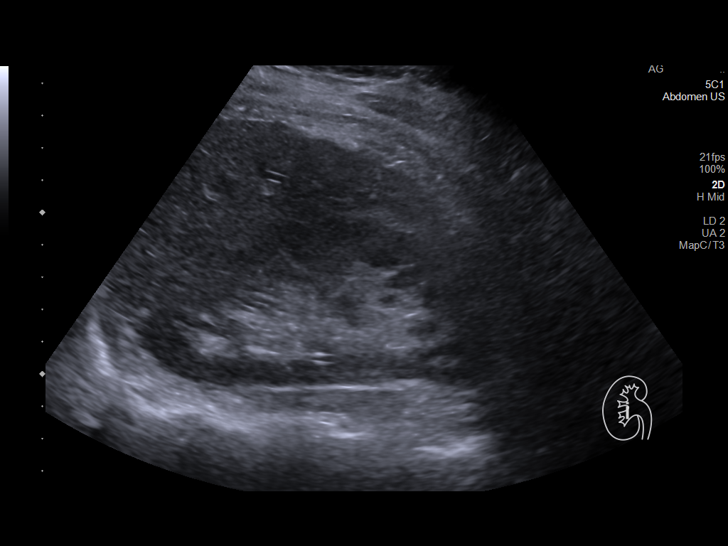
[im 7/42]
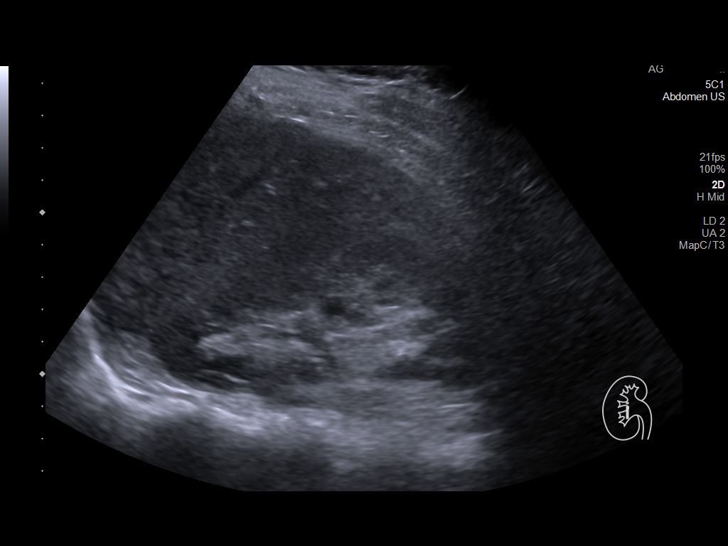
[im 11/42]
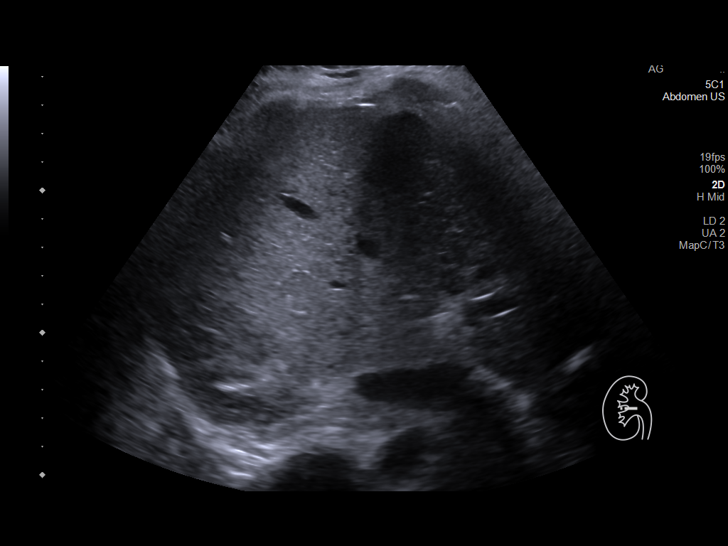
[im 14/42]
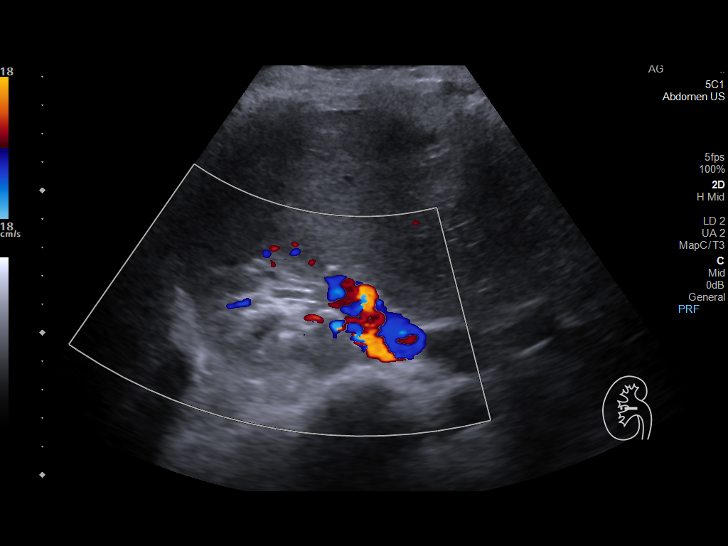
[im 16/42]
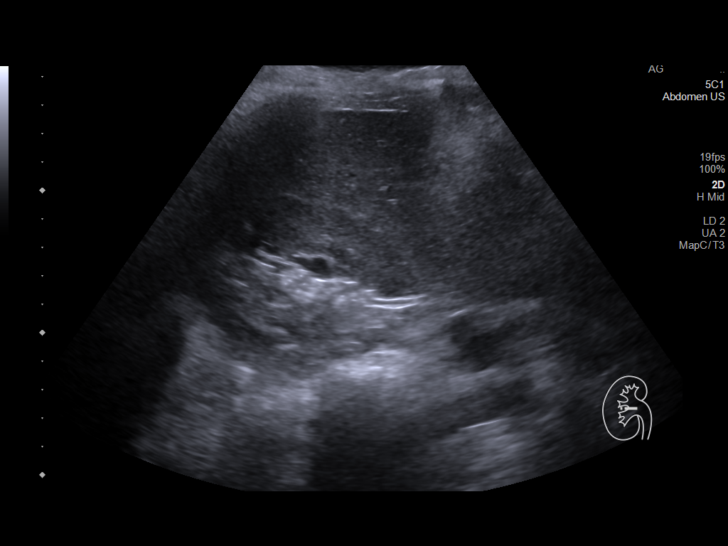
[im 19/42]
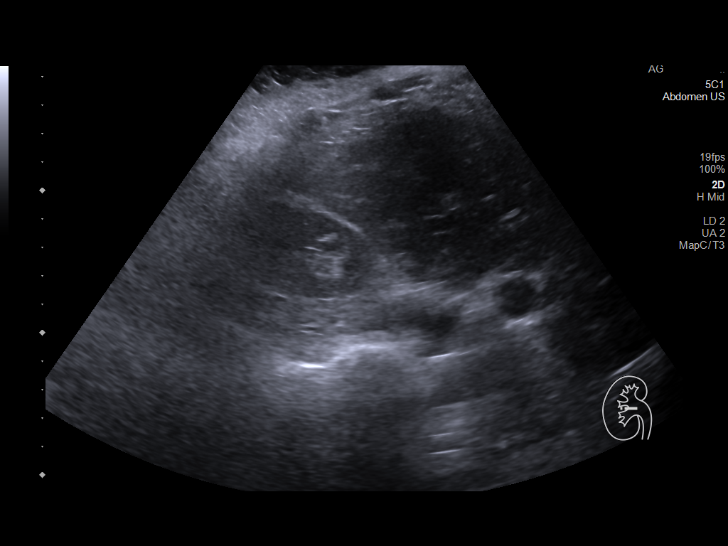
[im 23/42]
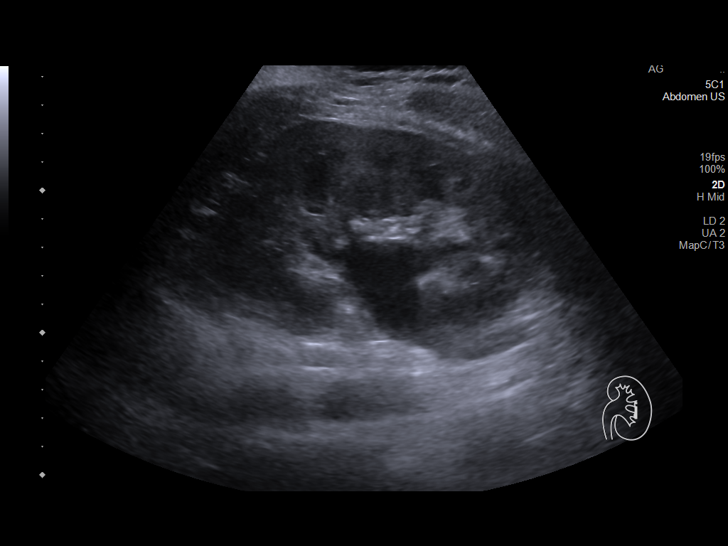
[im 26/42]
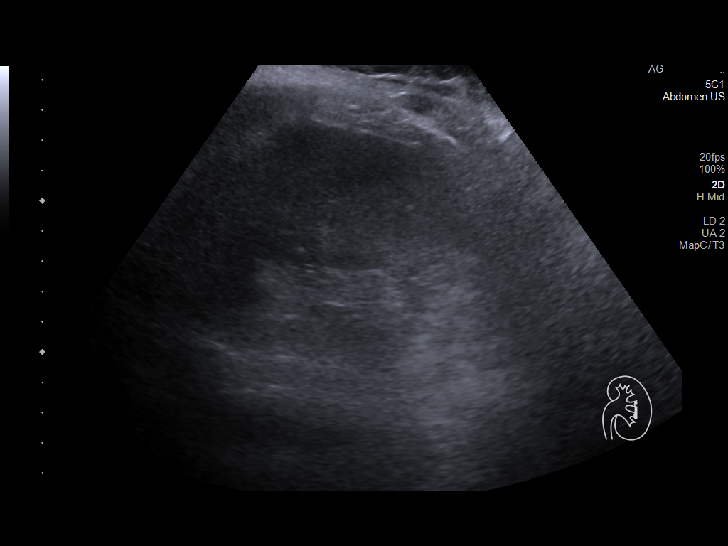
[im 28/42]
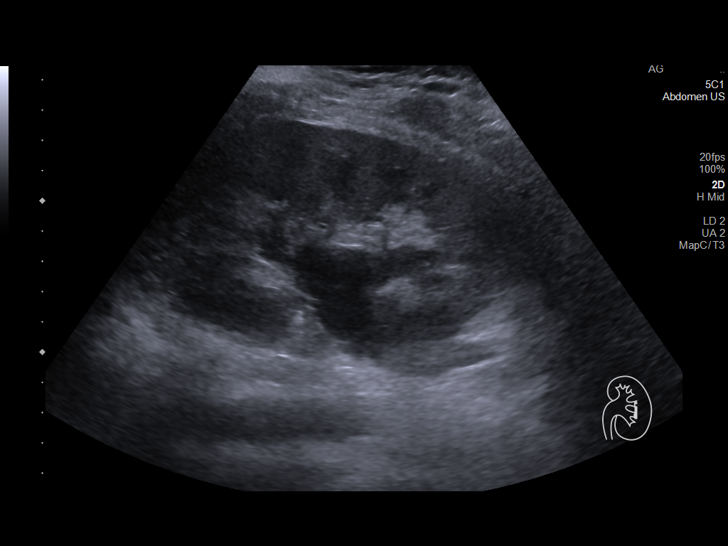
[im 31/42]
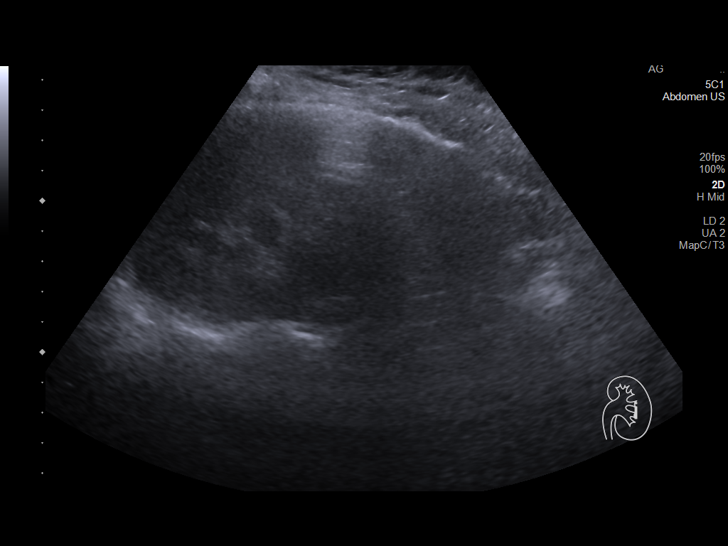
[im 35/42]
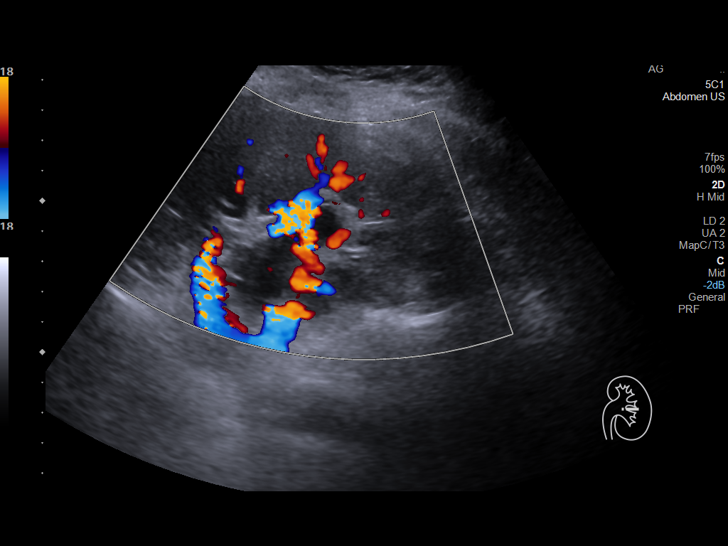
[im 38/42]
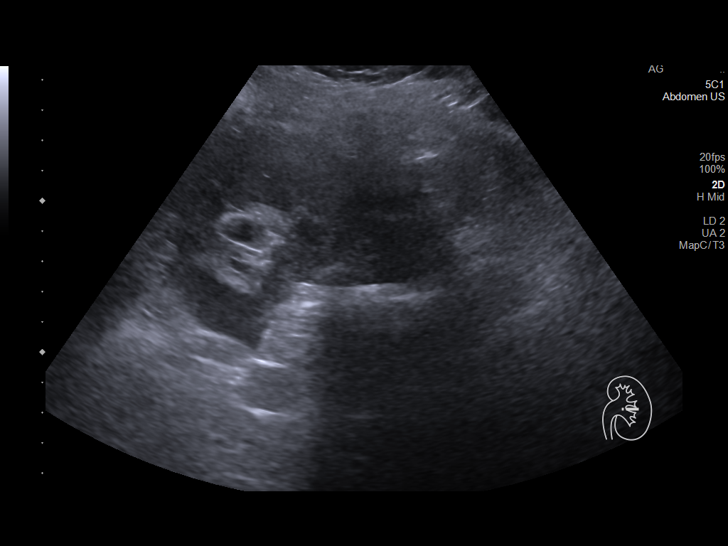
[im 42/42]
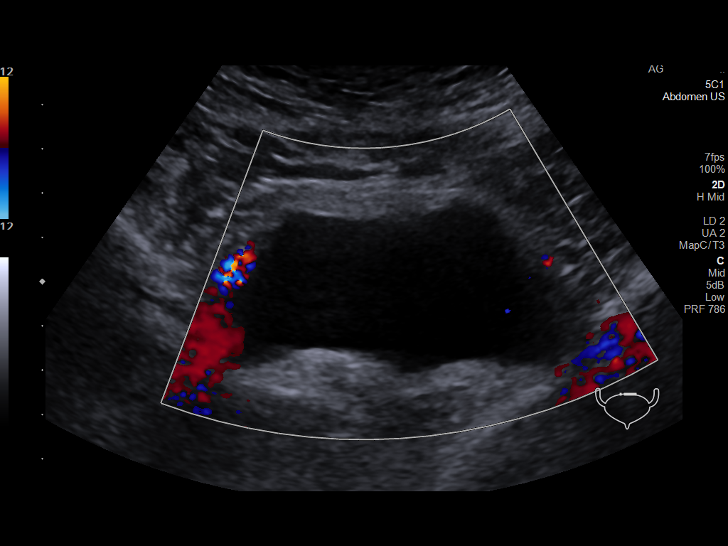

[14 of 25 positions shown; findings below may reference images not displayed]

FINDINGS: Right Kidney:

Renal measurements: 11.4 x 5.2 x 6.9 cm = volume: 213.2 mL .
Echogenicity within normal limits. No mass or hydronephrosis
visualized.

Left Kidney:

Renal measurements: 12.1 x 6.9 x 6.0 cm = volume: 260 mL. Moderate
LEFT added hydronephrosis. The ureter not well visualized.
Hydronephrosis appears similar accounting for differences in
technique to the previous imaging study.

Bladder:

Appears normal for degree of bladder distention.

Other:

None.
IMPRESSION: 1. Hydronephrosis on the LEFT similar to a study of greater than 1
year ago. At that time there was a calculus in the proximal LEFT
ureter. Ureter is not well evaluated on today's exam.
2. Correlatives plain film shows migration of the calculus at the
level of the distal LEFT ureter. Urologic consultation based on size
of the calculus, may be helpful if not yet obtained.

## 2021-06-06 IMAGING — US US RENAL
1 series · 14 of 25 positions shown · non-contrast
Comparison: 04/02/2020

CLINICAL DATA: Status post lithotripsy and stent placement

EXAM:
RENAL / URINARY TRACT ULTRASOUND COMPLETE

[Series 1: us renal · 14 of 31 slices shown]
[im 1/31]
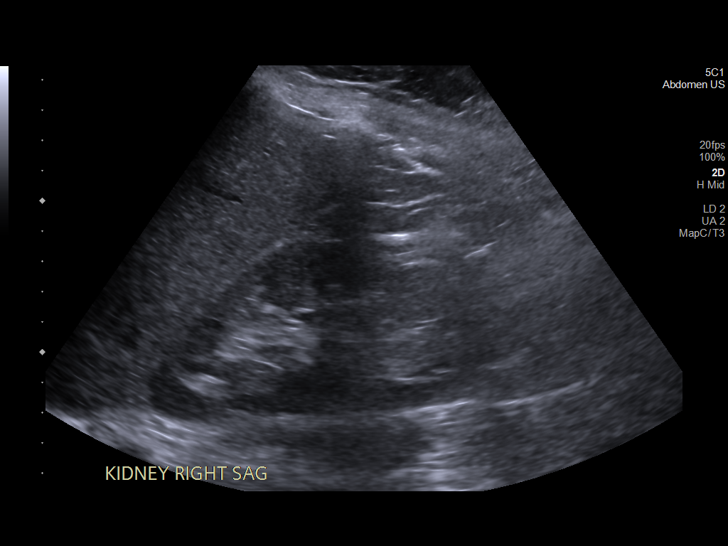
[im 3/31]
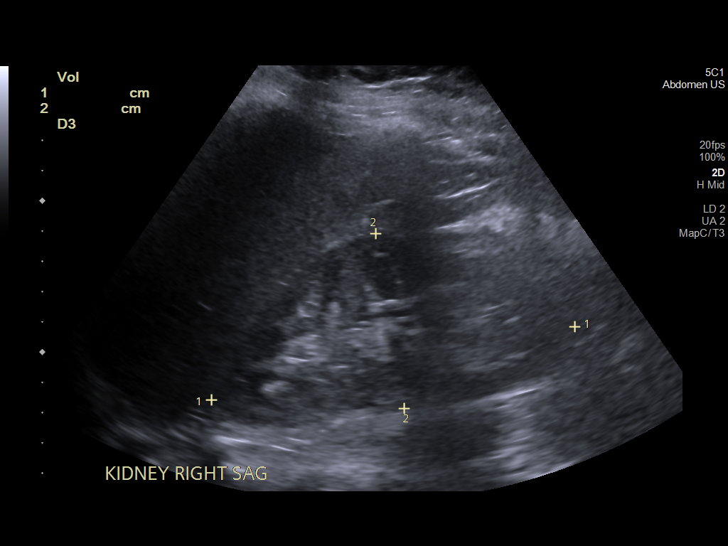
[im 6/31]
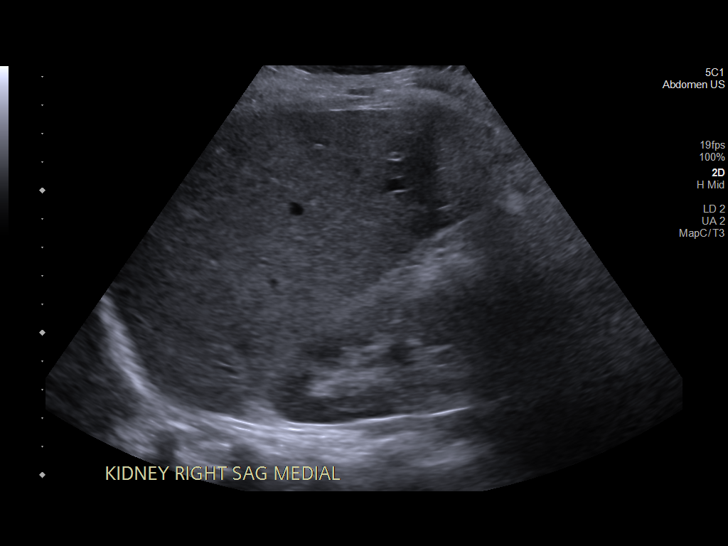
[im 8/31]
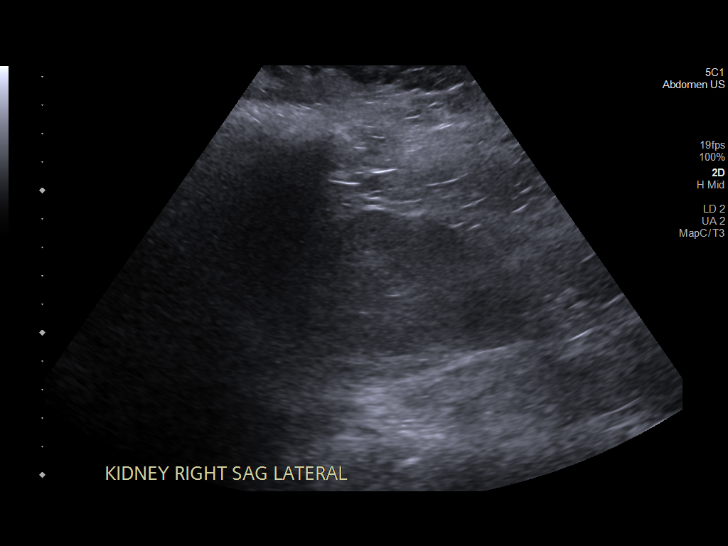
[im 11/31]
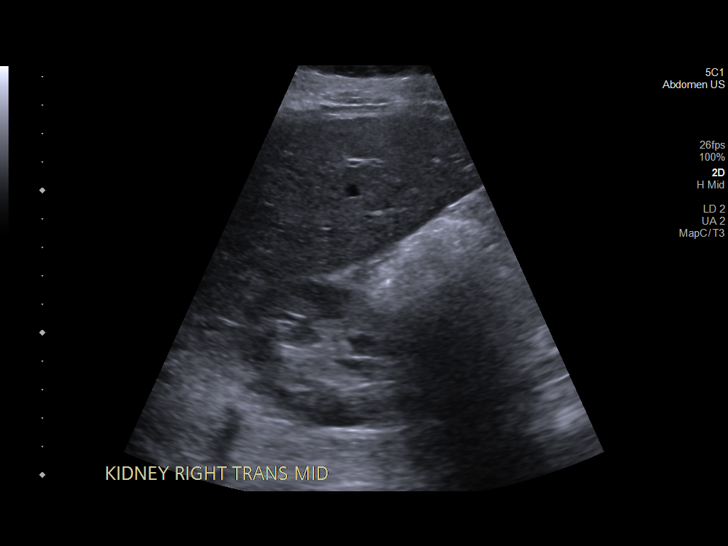
[im 12/31]
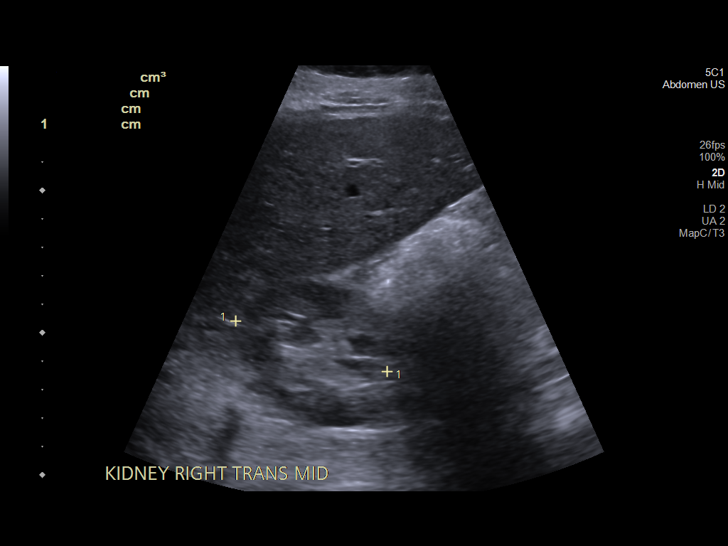
[im 14/31]
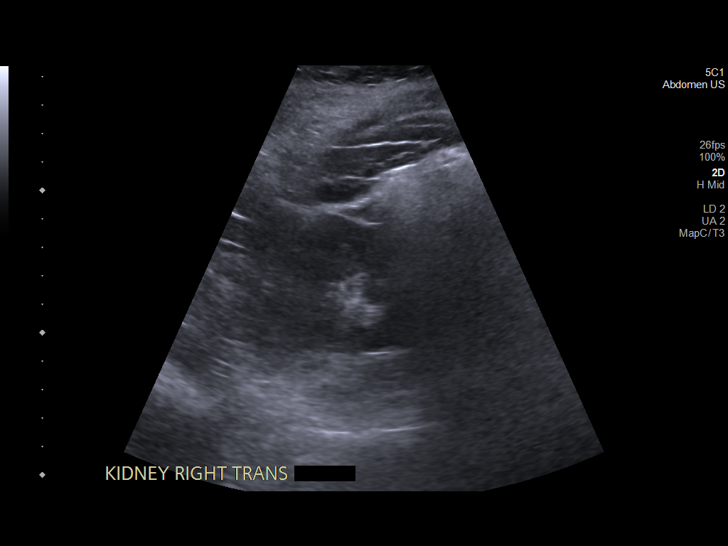
[im 17/31]
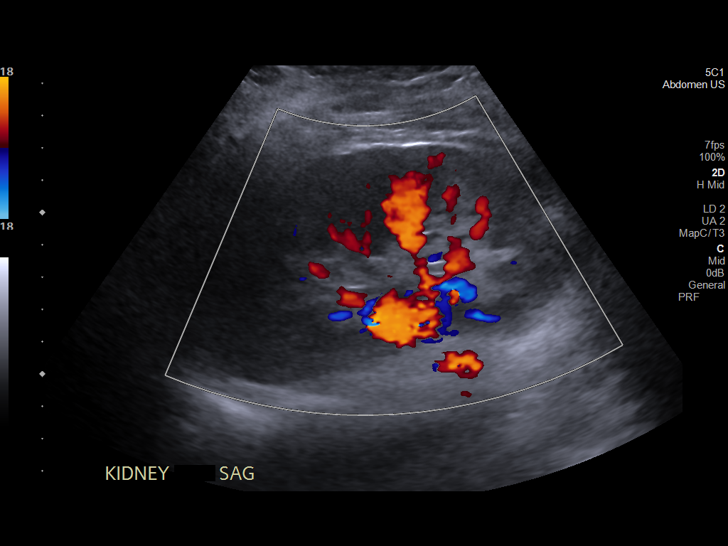
[im 19/31]
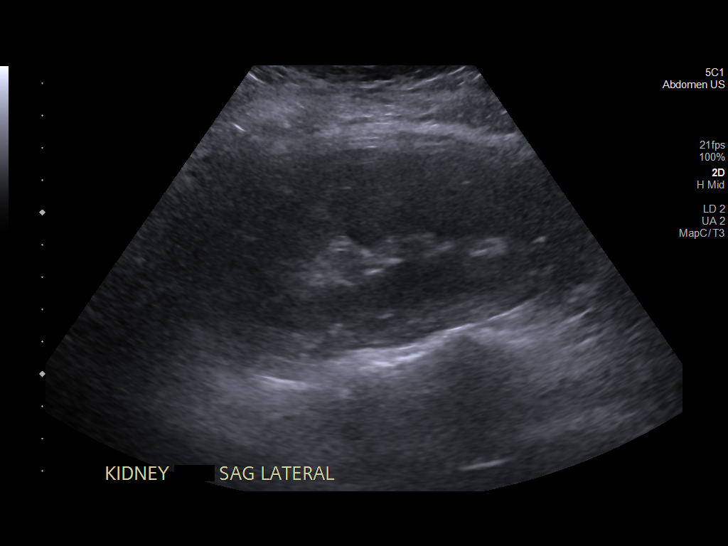
[im 21/31]
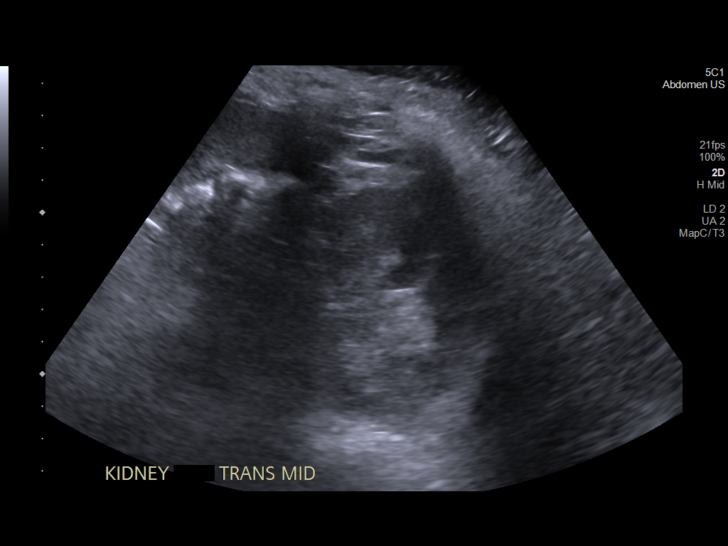
[im 23/31]
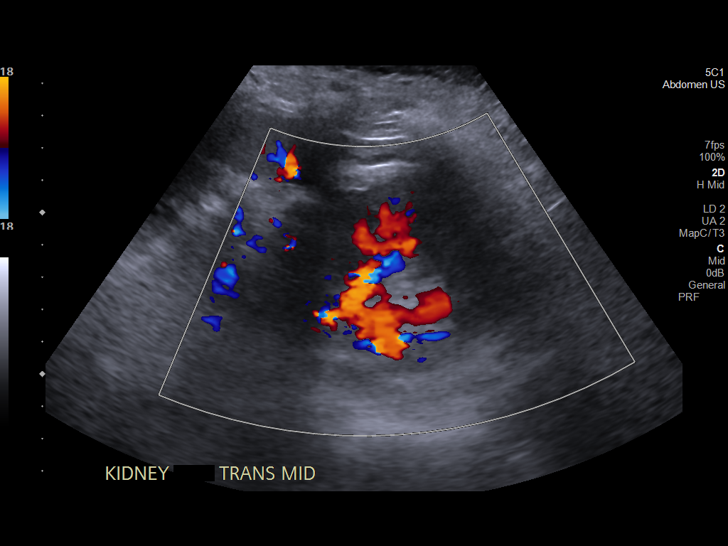
[im 26/31]
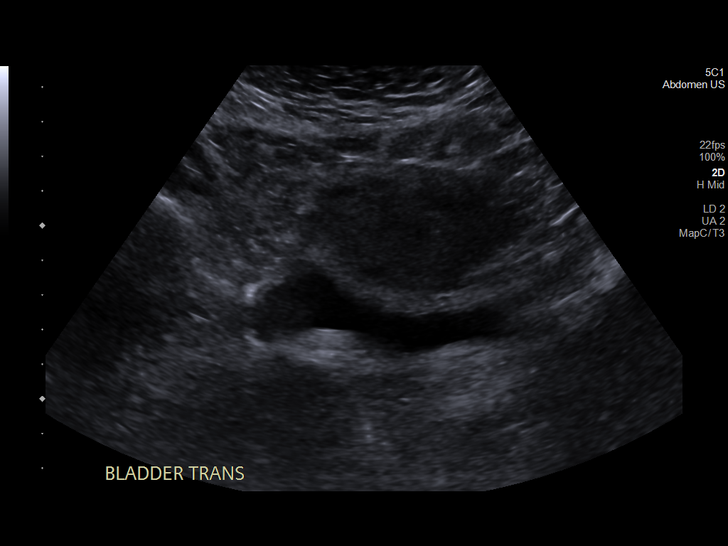
[im 28/31]
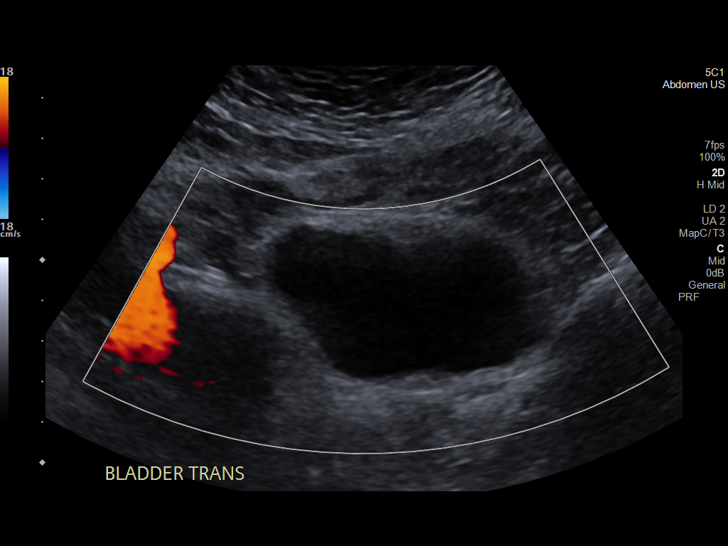
[im 31/31]
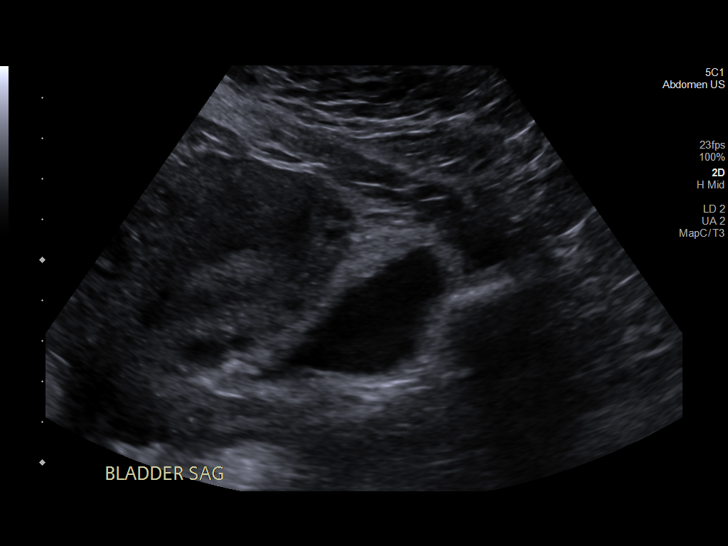

[14 of 25 positions shown; findings below may reference images not displayed]

FINDINGS: Right Kidney:

Renal measurements: 12.3 x 5.9 x 5.6 cm. = volume: 211 mL .
Echogenicity within normal limits. No mass or hydronephrosis
visualized.

Left Kidney:

Renal measurements: 12.1 x 6.7 x 6.4 cm. = volume: 270 mL.
Previously seen hydronephrosis has resolved consistent with the
prior intervention.

Bladder:

Appears normal for degree of bladder distention.

Other:

None.
IMPRESSION: Previously seen left hydronephrosis has resolved. No acute
abnormality noted.

## 2021-06-26 DIAGNOSIS — B349 Viral infection, unspecified: Secondary | ICD-10-CM | POA: Diagnosis not present

## 2021-07-31 DIAGNOSIS — Z23 Encounter for immunization: Secondary | ICD-10-CM | POA: Diagnosis not present

## 2021-08-01 DIAGNOSIS — Z23 Encounter for immunization: Secondary | ICD-10-CM | POA: Diagnosis not present

## 2021-09-06 DIAGNOSIS — B349 Viral infection, unspecified: Secondary | ICD-10-CM | POA: Diagnosis not present

## 2022-02-11 ENCOUNTER — Ambulatory Visit
Admission: EM | Admit: 2022-02-11 | Discharge: 2022-02-11 | Disposition: A | Discharge status: Home / Self Care | Payer: 59 | Attending: Nurse Practitioner | Admitting: Nurse Practitioner

## 2022-02-11 ENCOUNTER — Encounter: Payer: Self-pay | Admitting: Emergency Medicine

## 2022-02-11 DIAGNOSIS — J029 Acute pharyngitis, unspecified: Secondary | ICD-10-CM

## 2022-02-11 DIAGNOSIS — R051 Acute cough: Secondary | ICD-10-CM | POA: Diagnosis not present

## 2022-02-11 LAB — POCT RAPID STREP A (OFFICE): Rapid Strep A Screen: NEGATIVE

## 2022-02-11 MED ORDER — PSEUDOEPH-BROMPHEN-DM 30-2-10 MG/5ML PO SYRP: 5.0000 mL | ORAL_SOLUTION | Freq: Four times a day (QID) | ORAL | 0 refills | Status: DC | PRN

## 2022-02-11 NOTE — Discharge Instructions (Addendum)
Your rapid strep test was negative today.  A throat culture has been ordered.  You will be contacted if your results are positive to provide treatment. ?Take medication as prescribed. ?May take ibuprofen or Tylenol for pain, fever, or general discomfort. ?Supportive care to include increasing fluids and getting plenty of rest. ?Warm salt water gargles 3-4 times daily to help with throat symptoms. ?Continue to monitor your symptoms for any worsening.  If you develop fever, chills, worsening sore throat, difficulty swallowing, inability to swallowing, or drooling, follow-up as soon as possible. ? ?

## 2022-02-11 NOTE — ED Triage Notes (Signed)
Headache since last night, sore throat and fatigue ?

## 2022-02-11 NOTE — ED Provider Notes (Signed)
?[facility] ? ? ? ?CSN: [id number] ?Arrival date & time: 02/11/22  0830 ? ? ?  ? ?History   ?Chief Complaint ?No chief complaint on file. ? ? ?HPI ?Jessica Ward is a 31 y.o. female.  ? ?The patient is a 31 year old female who presents with headache, sore throat, and fatigue.  Symptoms started 1 day ago.  Patient states her daughter was recently diagnosed with strep.  Son was diagnosed with strep today.  She complains that her throat has been "scratchy".  She also states that she has had generalized headache.  She denies fever, chills, nasal congestion, runny nose, productive cough, abdominal pain, nausea, or vomiting.  She has not taken any medication for her symptoms at this time. ? ?The history is provided by the patient.  ? ?Past Medical History:  ?Diagnosis Date  ? High cholesterol   ? Kidney stones   ? Miscarriage   ? Normal delivery 10/27/2012  ? ? ?Patient Active Problem List  ? Diagnosis Date Noted  ? Dyspareunia in female 03/29/2019  ? Screening examination for STD (sexually transmitted disease) 03/29/2019  ? Vaginal itching 03/29/2019  ? Umbilical hernia reducible 03/23/2018  ? Flu 11/30/2017  ? Supervision of normal pregnancy 07/01/2017  ? Spontaneous abortion without complication 12/22/2016  ? Rubella non-immune status, antepartum 02/05/2015  ? Sterilization consult 08/22/2014  ? ? ?Past Surgical History:  ?Procedure Laterality Date  ? CYSTOSCOPY WITH RETROGRADE PYELOGRAM, URETEROSCOPY AND STENT PLACEMENT Left 04/17/2020  ? Procedure: CYSTOSCOPY WITH RETROGRADE PYELOGRAM, URETEROSCOPY AND STENT PLACEMENT, LASER LITHOTRIPSY, STONE EXTRACTION AND DOUBLE j STENT PLACEMENT;  Surgeon: Marcine Matar, MD;  Location: [facility];  Service: Urology;  Laterality: Left;  ? NO PAST SURGERIES    ? TUBAL LIGATION Bilateral 03/23/2018  ? Procedure: BILATERAL TUBAL LIGATION WITH FALLOPE RINGS;  Surgeon: Tilda Burrow, MD;  Location: [facility];  Service: Gynecology;  Laterality: Bilateral;  ? UMBILICAL HERNIA  REPAIR N/A 03/23/2018  ? Procedure: HERNIA REPAIR UMBILICAL ADULT;  Surgeon: Tilda Burrow, MD;  Location: [facility];  Service: Gynecology;  Laterality: N/A;  ? ? ?OB History   ? ? Gravida  ?5  ? Para  ?4  ? Term  ?4  ? Preterm  ?   ? AB  ?1  ? Living  ?4  ?  ? ? SAB  ?1  ? IAB  ?   ? Ectopic  ?   ? Multiple  ?0  ? Live Births  ?4  ?   ?  ?  ? ? ? ?Home Medications   ? ?Prior to Admission medications   ?Medication Sig Start Date End Date Taking? Authorizing Provider  ?brompheniramine-pseudoephedrine-DM 30-2-10 MG/5ML syrup Take 5 mLs by mouth 4 (four) times daily as needed. 02/11/22  Yes Marquelle Musgrave-Warren, Sadie Haber, NP  ?atorvastatin (LIPITOR) 10 MG tablet Take 10 mg by mouth daily. 03/30/20   [provider]  ?escitalopram (LEXAPRO) 20 MG tablet Take 20 mg by mouth daily. 03/29/20   [provider]  ?fluconazole (DIFLUCAN) 150 MG tablet Take 1 each month before period ?Patient taking differently: Take 150 mg by mouth every 30 (thirty) days. Prior to menstrual cycle 11/29/19   Adline Potter, NP  ?meloxicam (MOBIC) 15 MG tablet Take 1 tablet (15 mg total) by mouth daily. 04/12/21   Wurst, [place], PA-C  ?pantoprazole (PROTONIX) 40 MG tablet Take 1 tablet (40 mg total) by mouth daily. 08/10/20   Pauline Aus, PA-C  ?SUMAtriptan (IMITREX) 50 MG tablet TAKE  1 TO 2 TABELTS (50MG ) BY MOUTH AFTER ONSET OF MIGRAINE. MAY REPEAT AFTER 2 HOURS IF HEADACHE RETURNS, NOT TO EXCEED 200MG  IN 24 HOURS. ?Patient not taking: Reported on 04/11/2020 03/29/20   [provider]  ? ? ?Family History ?Family History  ?Problem Relation Age of Onset  ? Cancer Other   ?     leukemia, breast-maternal great grandma  ? Hypertension Mother   ? Diabetes Maternal Grandfather   ? Coronary artery disease Maternal Grandfather   ? Heart attack Maternal Grandfather   ? Stroke Maternal Grandfather   ? Hyperlipidemia Father   ? Heart disease Father   ?     MI 12/2014  ? Heart attack Father   ? Other Daughter   ?     tubes in ears  ?  Other Daughter   ?     tubes in ears  ? Hypercholesterolemia Paternal Aunt   ? ? ?Social History ?Social History  ? ?Tobacco Use  ? Smoking status: Never  ? Smokeless tobacco: Never  ?Vaping Use  ? Vaping Use: Never used  ?Substance Use Topics  ? Alcohol use: No  ? Drug use: No  ? ? ? ?Allergies   ?Patient has no known allergies. ? ? ?Review of Systems ?Review of Systems  ?Constitutional:  Positive for fatigue. Negative for activity change, appetite change and fever.  ?HENT:  Positive for sore throat. Negative for congestion, ear pain and trouble swallowing.   ?Eyes: Negative.   ?Respiratory:  Positive for cough. Negative for shortness of breath and wheezing.   ?Cardiovascular: Negative.   ?Gastrointestinal: Negative.   ?Skin: Negative.   ?Psychiatric/Behavioral: Negative.    ? ? ?Physical Exam ?Triage Vital Signs ?ED Triage Vitals  ?Enc Vitals Group  ?   BP 02/11/22 0936 111/75  ?   Pulse Rate 02/11/22 0936 81  ?   Resp 02/11/22 0936 18  ?   Temp 02/11/22 0936 97.9 ?F (36.6 ?C)  ?   Temp Source 02/11/22 0936 Oral  ?   SpO2 02/11/22 0936 98 %  ?   Weight --   ?   Height --   ?   Head Circumference --   ?   Peak Flow --   ?   Pain Score 02/11/22 0937 4  ?   Pain Loc --   ?   Pain Edu? --   ?   Excl. in GC? --   ? ?No data found. ? ?Updated Vital Signs ?BP 111/75 (BP Location: Right Arm)   Pulse 81   Temp 97.9 ?F (36.6 ?C) (Oral)   Resp 18   LMP 01/21/2022 (Approximate)   SpO2 98%  ? ?Visual Acuity ?Right Eye Distance:   ?Left Eye Distance:   ?Bilateral Distance:   ? ?Right Eye Near:   ?Left Eye Near:    ?Bilateral Near:    ? ?Physical Exam ?Vitals reviewed.  ?Constitutional:   ?   Appearance: Normal appearance.  ?HENT:  ?   Head: Normocephalic.  ?   Right Ear: Tympanic membrane, ear canal and external ear normal.  ?   Left Ear: Tympanic membrane, ear canal and external ear normal.  ?   Nose: Nose normal.  ?   Mouth/Throat:  ?   Mouth: Mucous membranes are moist.  ?   Pharynx: Uvula midline. No uvula swelling.  ?    Tonsils: No tonsillar exudate. 1+ on the right. 1+ on the left.  ?Eyes:  ?  Extraocular Movements: Extraocular movements intact.  ?   Conjunctiva/sclera: Conjunctivae normal.  ?   Pupils: Pupils are equal, round, and reactive to light.  ?Cardiovascular:  ?   Rate and Rhythm: Normal rate and regular rhythm.  ?   Pulses: Normal pulses.  ?   Heart sounds: Normal heart sounds.  ?Pulmonary:  ?   Effort: Pulmonary effort is normal.  ?   Breath sounds: Normal breath sounds.  ?Abdominal:  ?   General: Bowel sounds are normal.  ?   Palpations: Abdomen is soft.  ?Musculoskeletal:  ?   Cervical back: Normal range of motion.  ?Lymphadenopathy:  ?   Cervical: No cervical adenopathy.  ?Skin: ?   General: Skin is warm and dry.  ?Neurological:  ?   General: No focal deficit present.  ?   Mental Status: She is alert and oriented to person, place, and time.  ?Psychiatric:     ?   Mood and Affect: Mood normal.     ?   Behavior: Behavior normal.  ? ? ? ?UC Treatments / Results  ?Labs ?(all labs ordered are listed, but only abnormal results are displayed) ?Labs Reviewed  ?CULTURE, GROUP A STREP [facility])  ?POCT RAPID STREP A (OFFICE)  ? ? ?EKG ? ? ?Radiology ?No results found. ? ?Procedures ?Procedures (including critical care time) ? ?Medications Ordered in UC ?Medications - No data to display ? ?Initial Impression / Assessment and Plan / UC Course  ?I have reviewed the triage vital signs and the nursing notes. ? ?Pertinent labs & imaging results that were available during my care of the patient were reviewed by me and considered in my medical decision making (see chart for details). ? ?The patient is a 31 year old female who presents with sore throat.  She also complains of headache and fatigue.  Symptoms have been present for 1 day.  She denies fever, chills, and on exam has no cervical adenopathy or exudate.  Her rapid strep test is negative today.  A throat culture has been ordered.  Patient advised that she will be contacted if  her throat culture is positive to provide treatment.  In the interim, will provide patient Bromfed for cough.  Supportive care to include increasing fluids and getting plenty of rest.  Warm salt water gargles

## 2022-02-14 LAB — CULTURE, GROUP A STREP (THRC)

## 2022-03-21 ENCOUNTER — Other Ambulatory Visit (HOSPITAL_COMMUNITY)
Admission: RE | Admit: 2022-03-21 | Discharge: 2022-03-21 | Disposition: A | Discharge status: Home / Self Care | Payer: 59 | Source: Ambulatory Visit | Attending: Adult Health | Admitting: Adult Health

## 2022-03-21 ENCOUNTER — Ambulatory Visit (INDEPENDENT_AMBULATORY_CARE_PROVIDER_SITE_OTHER): Payer: 59 | Admitting: Adult Health

## 2022-03-21 ENCOUNTER — Encounter: Payer: Self-pay | Admitting: Adult Health

## 2022-03-21 VITALS — BP 105/71 | HR 98 | Ht 64.5 in | Wt 206.0 lb

## 2022-03-21 DIAGNOSIS — N3946 Mixed incontinence: Secondary | ICD-10-CM | POA: Diagnosis not present

## 2022-03-21 DIAGNOSIS — Z01419 Encounter for gynecological examination (general) (routine) without abnormal findings: Secondary | ICD-10-CM | POA: Insufficient documentation

## 2022-03-21 DIAGNOSIS — R319 Hematuria, unspecified: Secondary | ICD-10-CM | POA: Insufficient documentation

## 2022-03-21 LAB — POCT URINALYSIS DIPSTICK
Glucose, UA: NEGATIVE
Ketones, UA: NEGATIVE
Leukocytes, UA: NEGATIVE
Nitrite, UA: NEGATIVE
Protein, UA: NEGATIVE

## 2022-03-21 MED ORDER — OXYBUTYNIN CHLORIDE ER 10 MG PO TB24: 10.0000 mg | ORAL_TABLET | Freq: Every day | ORAL | 2 refills | Status: AC

## 2022-03-21 NOTE — Progress Notes (Signed)
Patient ID: Jessica Ward, female   DOB: Jul 15, 1991, 31 y.o.   MRN: [id number] History of Present Illness:  Jessica Ward is a 31 year old white female,married, [id number] in for a well woman gyn exam and pap. She has heavy periods with clots some months but not every . And has urinary incontinence before period, stress and urge. She had labs and physical with Dr Phillips Odor earlier this year.  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  PCP is Dr Phillips Odor  Current Medications, Allergies, Past Medical History, Past Surgical History, Family History and Social History were reviewed in [employer] electronic medical record.     Review of Systems: Patient denies any headaches, hearing loss, fatigue, blurred vision, shortness of breath, chest pain, abdominal pain, problems with bowel movements, or intercourse. No joint pain or mood swings.  See HPI for positives   Physical Exam:BP 105/71 (BP Location: Left Arm, Patient Position: Sitting, Cuff Size: Large)   Pulse 98   Ht 5' 4.5" (1.638 m)   Wt 206 lb (93.4 kg)   LMP 03/14/2022   BMI 34.81 kg/m   urine dipstick +blood. General:  Well developed, well nourished, no acute distress Skin:  Warm and dry Neck:  Midline trachea, normal thyroid, good ROM, no lymphadenopathy Lungs; Clear to auscultation bilaterally Breast:  No dominant palpable mass, retraction, or nipple discharge Cardiovascular: Regular rate and rhythm Abdomen:  Soft, non tender, no hepatosplenomegaly Pelvic:  External genitalia is normal in appearance, no lesions.  The vagina is normal in appearance. +period blood. Urethra has no lesions or masses. The cervix is bulbous.Pap with GC/CHL and HR HPV genotyping performed.   Uterus is felt to be normal size, shape, and contour.Mildly tender with palpation.  No adnexal masses or tenderness noted.Bladder is non tender, no masses felt. Extremities/musculoskeletal:  No swelling or varicosities noted, no clubbing or cyanosis Psych:  No mood changes, alert and cooperative,seems  happy AA is 1 Fall risk is low    03/21/2022   11:40 AM 07/01/2017   11:42 AM  Depression screen PHQ 2/9  Decreased Interest 1 0  Down, Depressed, Hopeless 1 0  PHQ - 2 Score 2 0  Altered sleeping 0 1  Tired, decreased energy 1 1  Change in appetite 0 1  Feeling bad or failure about yourself  1 0  Trouble concentrating 0 0  Moving slowly or fidgety/restless 0 0  Suicidal thoughts 0 0  PHQ-9 Score 4 3  Difficult doing work/chores  Not difficult at all       03/21/2022   11:40 AM  GAD 7 : Generalized Anxiety Score  Nervous, Anxious, on Edge 0  Control/stop worrying 0  Worry too much - different things 1  Trouble relaxing 2  Restless 0  Easily annoyed or irritable 1  Afraid - awful might happen 0  Total GAD 7 Score 4      Upstream - 03/21/22 1139       Pregnancy Intention Screening   Does the patient want to become pregnant in the next year? No    Does the patient's partner want to become pregnant in the next year? No    Would the patient like to discuss contraceptive options today? No      Contraception Wrap Up   Current Method Female Sterilization    End Method Female Sterilization            Examination chaperoned by Malachy Mood LPN   Impression and Plan: 1. Hematuria, unspecified type  2. Encounter for gynecological examination with Papanicolaou smear of cervix Pap sent Physical with PCP Pap in 3 years if normal Labs with PCP   3. Mixed stress and urge urinary incontinence Will try ditropan if does not help, consider pelvic floor PT She will let me know  Meds ordered this encounter  Medications   oxybutynin (DITROPAN XL) 10 MG 24 hr tablet    Sig: Take 1 tablet (10 mg total) by mouth daily.    Dispense:  30 tablet    Refill:  2    Order Specific Question:   Supervising Provider    Answer:   Duane Lope H [[id number]]

## 2022-03-25 LAB — CYTOLOGY - PAP
Chlamydia: NEGATIVE
Comment: NEGATIVE
Comment: NEGATIVE
Comment: NORMAL
Diagnosis: NEGATIVE
High risk HPV: NEGATIVE
Neisseria Gonorrhea: NEGATIVE

## 2022-05-14 ENCOUNTER — Ambulatory Visit
Admission: EM | Admit: 2022-05-14 | Discharge: 2022-05-14 | Disposition: A | Discharge status: Home / Self Care | Payer: 59 | Attending: Nurse Practitioner | Admitting: Nurse Practitioner

## 2022-05-14 ENCOUNTER — Other Ambulatory Visit: Payer: Self-pay

## 2022-05-14 ENCOUNTER — Encounter: Payer: Self-pay | Admitting: Emergency Medicine

## 2022-05-14 DIAGNOSIS — R399 Unspecified symptoms and signs involving the genitourinary system: Secondary | ICD-10-CM | POA: Diagnosis not present

## 2022-05-14 LAB — POCT URINALYSIS DIP (MANUAL ENTRY)
Bilirubin, UA: NEGATIVE
Glucose, UA: NEGATIVE mg/dL
Ketones, POC UA: NEGATIVE mg/dL
Leukocytes, UA: NEGATIVE
Nitrite, UA: NEGATIVE
Protein Ur, POC: NEGATIVE mg/dL
Spec Grav, UA: 1.025 (ref 1.010–1.025)
Urobilinogen, UA: 1 E.U./dL
pH, UA: 6.5 (ref 5.0–8.0)

## 2022-05-14 MED ORDER — SULFAMETHOXAZOLE-TRIMETHOPRIM 800-160 MG PO TABS: 1.0000 | ORAL_TABLET | Freq: Two times a day (BID) | ORAL | 0 refills | Status: AC

## 2022-05-14 NOTE — ED Provider Notes (Signed)
[facility]    CSN: [id number] Arrival date & time: 05/14/22  1818      History   Chief Complaint Chief Complaint  Patient presents with   Urinary Frequency    HPI Jessica Ward is a 31 y.o. female.   The history is provided by the patient.   Patient presents for complaints of dysuria, urinary frequency, suprapubic tenderness, and urgency that has been present for the past 3 days.  Patient denies fever, chills, hematuria, foul-smelling urine, decreased urine stream, low back pain, abdominal pain, nausea, vomiting, or diarrhea.  [place] had symptoms since that time.  She denies history of recurrent UTIs or kidney stones.  She has not taken any medication for her symptoms.  Past Medical History:  Diagnosis Date   High cholesterol    Kidney stones    Miscarriage    Normal delivery 10/27/2012    Patient Active Problem List   Diagnosis Date Noted   Encounter for gynecological examination with Papanicolaou smear of cervix 03/21/2022   Hematuria 03/21/2022   Mixed stress and urge urinary incontinence 03/21/2022   Dyspareunia in female 03/29/2019   Screening examination for STD (sexually transmitted disease) 03/29/2019   Vaginal itching 03/29/2019   Umbilical hernia reducible 03/23/2018   Flu 11/30/2017   Supervision of normal pregnancy 07/01/2017   Spontaneous abortion without complication 12/22/2016   Rubella non-immune status, antepartum 02/05/2015   Sterilization consult 08/22/2014    Past Surgical History:  Procedure Laterality Date   CYSTOSCOPY WITH RETROGRADE PYELOGRAM, URETEROSCOPY AND STENT PLACEMENT Left 04/17/2020   Procedure: CYSTOSCOPY WITH RETROGRADE PYELOGRAM, URETEROSCOPY AND STENT PLACEMENT, LASER LITHOTRIPSY, STONE EXTRACTION AND DOUBLE j STENT PLACEMENT;  Surgeon: Marcine Matar, MD;  Location: [facility];  Service: Urology;  Laterality: Left;   NO PAST SURGERIES     TUBAL  LIGATION Bilateral 03/23/2018   Procedure: BILATERAL TUBAL LIGATION WITH FALLOPE RINGS;  Surgeon: Tilda Burrow, MD;  Location: [facility];  Service: Gynecology;  Laterality: Bilateral;   UMBILICAL HERNIA REPAIR N/A 03/23/2018   Procedure: HERNIA REPAIR UMBILICAL ADULT;  Surgeon: Tilda Burrow, MD;  Location: [facility];  Service: Gynecology;  Laterality: N/A;    OB History     Gravida  5   Para  4   Term  4   Preterm      AB  1   Living  4      SAB  1   IAB      Ectopic      Multiple  0   Live Births  4            Home Medications    Prior to Admission medications   Medication Sig Start Date End Date Taking? Authorizing Provider  sulfamethoxazole-trimethoprim (BACTRIM DS) 800-160 MG tablet Take 1 tablet by mouth 2 (two) times daily for 7 days. 05/14/22 05/21/22 Yes Skyler Carel-Warren, Sadie Haber, NP  cyclobenzaprine (FLEXERIL) 10 MG tablet Take 10 mg by mouth every 8 (eight) hours as needed. 02/07/22   [provider]  escitalopram (LEXAPRO) 20 MG tablet Take 20 mg by mouth daily. 03/29/20   [provider]  oxybutynin (DITROPAN XL) 10 MG 24 hr tablet Take 1 tablet (10 mg total) by mouth daily. 03/21/22 03/21/23  Adline Potter, NP  phentermine (ADIPEX-P) 37.5 MG tablet Take 37.5 mg by mouth every morning. 03/08/22   [provider]  rosuvastatin (CRESTOR) 10 MG tablet  Take 10 mg by mouth daily. 03/14/22   [provider]  SUMAtriptan (IMITREX) 50 MG tablet TAKE 1 TO 2 TABELTS (50MG ) BY MOUTH AFTER ONSET OF MIGRAINE. MAY REPEAT AFTER 2 HOURS IF HEADACHE RETURNS, NOT TO EXCEED 200MG  IN 24 HOURS. 03/29/20   [provider]    Family History Family History  Problem Relation Age of Onset   Cancer Other        leukemia, breast-maternal great grandma   Hypertension Mother    Diabetes Maternal Grandfather    Coronary artery disease Maternal Grandfather    Heart attack Maternal Grandfather    Stroke Maternal Grandfather    Hyperlipidemia  Father    Heart disease Father        MI 12/2014   Heart attack Father    Other Daughter        tubes in ears   Other Daughter        tubes in ears   Hypercholesterolemia Paternal Aunt     Social History Social History   Tobacco Use   Smoking status: Never   Smokeless tobacco: Never  Vaping Use   Vaping Use: Never used  Substance Use Topics   Alcohol use: Yes    Comment: occ   Drug use: No     Allergies   Patient has no known allergies.   Review of Systems Review of Systems Per HPI  Physical Exam Triage Vital Signs ED Triage Vitals [05/14/22 1926]  Enc Vitals Group     BP 114/78     Pulse Rate 88     Resp 18     Temp 97.9 F (36.6 C)     Temp Source Oral     SpO2 97 %     Weight      Height      Head Circumference      Peak Flow      Pain Score 4     Pain Loc      Pain Edu?      Excl. in GC?    No data found.  Updated Vital Signs BP 114/78 (BP Location: Right Arm)   Pulse 88   Temp 97.9 F (36.6 C) (Oral)   Resp 18   LMP 05/04/2022 (Approximate)   SpO2 97%   Visual Acuity Right Eye Distance:   Left Eye Distance:   Bilateral Distance:    Right Eye Near:   Left Eye Near:    Bilateral Near:     Physical Exam Vitals and nursing note reviewed.  Constitutional:      General: She is not in acute distress.    Appearance: She is well-developed.  HENT:     Head: Normocephalic.     Mouth/Throat:     Mouth: Mucous membranes are moist.  Eyes:     Extraocular Movements: Extraocular movements intact.     Pupils: Pupils are equal, round, and reactive to light.  Cardiovascular:     Rate and Rhythm: Normal rate and regular rhythm.     Pulses: Normal pulses.     Heart sounds: Normal heart sounds.  Pulmonary:     Effort: Pulmonary effort is normal.     Breath sounds: Normal breath sounds.  Abdominal:     General: Bowel sounds are normal. There is no distension.     Palpations: Abdomen is soft.     Tenderness: There is abdominal tenderness in  the suprapubic area. There is left CVA tenderness. There is no guarding  or rebound.  Genitourinary:    Vagina: Normal. No vaginal discharge.  Musculoskeletal:     Cervical back: Normal range of motion.  Skin:    General: Skin is warm and dry.     Findings: No erythema or rash.  Neurological:     Mental Status: She is alert and oriented to person, place, and time.     Cranial Nerves: No cranial nerve deficit.  Psychiatric:        Mood and Affect: Mood normal.        Behavior: Behavior normal.      UC Treatments / Results  Labs (all labs ordered are listed, but only abnormal results are displayed) Labs Reviewed  POCT URINALYSIS DIP (MANUAL ENTRY) - Abnormal; Notable for the following components:      Result Value   Blood, UA trace-intact (*)    All other components within normal limits  URINE CULTURE    EKG   Radiology No results found.  Procedures Procedures (including critical care time)  Medications Ordered in UC Medications - No data to display  Initial Impression / Assessment and Plan / UC Course  I have reviewed the triage vital signs and the nursing notes.  Pertinent labs & imaging results that were available during my care of the patient were reviewed by me and considered in my medical decision making (see chart for details).  Patient presents with urinary symptoms that been present for the past 3 days.  Urinalysis does not indicate urinary tract infection, but on exam, patient has left CVA tenderness and suprapubic tenderness.  We will treat patient based on her symptoms at this time.  Urine culture is pending.  Patient was advised that if her culture is negative, she will be contacted and asked to stop the medication.  Supportive care recommendations were provided to the patient, with strict indications of when to go to the emergency department.  Patient was advised that if her culture results are negative and she continues to experience symptoms, she will need  to follow-up with her primary care physician. Final Clinical Impressions(s) / UC Diagnoses   Final diagnoses:  Symptoms of urinary tract infection     Discharge Instructions      -Your urinalysis does not indicate you have a urinary tract infection. Based on your symptoms, I am treating you with an antibiotic.  -A urine culture is pending. If the results are negative, you will be contacted to stop the antibiotic.  -Take medications as prescribed. -Increase fluids. -Ibuprofen or Tylenol for pain, fever, or general discomfort. -Develop a toileting schedule that will allow you to toilet at least every 2 hours. -Avoid caffeine to include tea, soda, and coffee. -If sexually active, void at least 15 to 20 minutes after sexual intercourse. -Follow-up in the emergency department if you develop fever, chills, worsening abdominal pain, or other concerns.  -If your urine culture is negative and you continue to experience symptoms, please follow-up with your PCP.    ED Prescriptions     Medication Sig Dispense Auth. Provider   sulfamethoxazole-trimethoprim (BACTRIM DS) 800-160 MG tablet Take 1 tablet by mouth 2 (two) times daily for 7 days. 14 tablet Akeel Reffner-Warren, Sadie Haber, NP      PDMP not reviewed this encounter.   Abran Cantor, NP 05/14/22 2001

## 2022-05-14 NOTE — ED Triage Notes (Signed)
Pt reports lower abdominal pain, urinary frequency x3 days. Pt denies any known fevers.

## 2022-05-14 NOTE — Discharge Instructions (Signed)
-  Your urinalysis does not indicate you have a urinary tract infection. Based on your symptoms, I am treating you with an antibiotic.  -A urine culture is pending. If the results are negative, you will be contacted to stop the antibiotic.  -Take medications as prescribed. -Increase fluids. -Ibuprofen or Tylenol for pain, fever, or general discomfort. -Develop a toileting schedule that will allow you to toilet at least every 2 hours. -Avoid caffeine to include tea, soda, and coffee. -If sexually active, void at least 15 to 20 minutes after sexual intercourse. -Follow-up in the emergency department if you develop fever, chills, worsening abdominal pain, or other concerns.  -If your urine culture is negative and you continue to experience symptoms, please follow-up with your PCP.

## 2022-05-16 LAB — URINE CULTURE: Culture: NO GROWTH

## 2023-01-14 DIAGNOSIS — H5213 Myopia, bilateral: Secondary | ICD-10-CM | POA: Diagnosis not present

## 2023-02-23 DIAGNOSIS — Z117 Encounter for testing for latent tuberculosis infection: Secondary | ICD-10-CM | POA: Diagnosis not present

## 2023-06-11 DIAGNOSIS — E669 Obesity, unspecified: Secondary | ICD-10-CM | POA: Diagnosis not present

## 2023-06-11 DIAGNOSIS — Z0000 Encounter for general adult medical examination without abnormal findings: Secondary | ICD-10-CM | POA: Diagnosis not present

## 2023-06-11 DIAGNOSIS — E782 Mixed hyperlipidemia: Secondary | ICD-10-CM | POA: Diagnosis not present

## 2023-09-07 DIAGNOSIS — J069 Acute upper respiratory infection, unspecified: Secondary | ICD-10-CM | POA: Diagnosis not present

## 2023-09-08 DIAGNOSIS — E782 Mixed hyperlipidemia: Secondary | ICD-10-CM | POA: Diagnosis not present

## 2023-12-29 DIAGNOSIS — E782 Mixed hyperlipidemia: Secondary | ICD-10-CM | POA: Diagnosis not present

## 2023-12-29 DIAGNOSIS — E669 Obesity, unspecified: Secondary | ICD-10-CM | POA: Diagnosis not present

## 2024-02-16 DIAGNOSIS — E782 Mixed hyperlipidemia: Secondary | ICD-10-CM | POA: Diagnosis not present

## 2024-03-07 ENCOUNTER — Ambulatory Visit (HOSPITAL_COMMUNITY)
Admission: RE | Admit: 2024-03-07 | Discharge: 2024-03-07 | Disposition: A | Discharge status: Home / Self Care | Source: Ambulatory Visit | Attending: Family Medicine | Admitting: Family Medicine

## 2024-03-07 ENCOUNTER — Other Ambulatory Visit (HOSPITAL_COMMUNITY): Payer: Self-pay | Admitting: Family Medicine

## 2024-03-07 DIAGNOSIS — M7989 Other specified soft tissue disorders: Secondary | ICD-10-CM | POA: Diagnosis not present

## 2024-03-07 DIAGNOSIS — R600 Localized edema: Secondary | ICD-10-CM | POA: Diagnosis not present

## 2024-03-21 ENCOUNTER — Ambulatory Visit
Admission: EM | Admit: 2024-03-21 | Discharge: 2024-03-21 | Disposition: A | Discharge status: Home / Self Care | Attending: Nurse Practitioner | Admitting: Nurse Practitioner

## 2024-03-21 DIAGNOSIS — S86912A Strain of unspecified muscle(s) and tendon(s) at lower leg level, left leg, initial encounter: Secondary | ICD-10-CM | POA: Diagnosis not present

## 2024-03-21 MED ORDER — CELECOXIB 200 MG PO CAPS
200.0000 mg | ORAL_CAPSULE | Freq: Two times a day (BID) | ORAL | 0 refills | Status: AC | PRN
Start: 2024-03-21 — End: ?

## 2024-03-21 NOTE — ED Triage Notes (Signed)
 Pt reports Swelling and pain in the left leg, saw PCP x 2 weeks ago had Ultrasound to rule out Blood clot, pain has been present x 2 days.

## 2024-03-21 NOTE — ED Notes (Signed)
 Applied ace wrap to left leg where swelling was.

## 2024-03-21 NOTE — Discharge Instructions (Addendum)
 Stop ibuprofen  and start taking the Celebrex I have sent to the pharmacy to help with pain and inflammation in your knee.   You can take the Celebrex up to twice daily as needed. Avoid other NSAIDs.  You can take Tylenol  [phone] mg every 6 hours for pain and use the ace wrap to help provide support and compression.  Additionally, recommend applying ice when sitting down to help with pain.   If pain is not much improved after a few days, recommend follow up with Orthopedist; contact information has been provided.

## 2024-03-21 NOTE — ED Provider Notes (Signed)
 [facility]    CSN: [id number] Arrival date & time: 03/21/24  1712      History   Chief Complaint No chief complaint on file.   HPI Jessica Ward is a 33 y.o. female.   Patient today with 3-week history of left knee pain.  She denies any recent trauma, fall, or known injury to the left knee.  Reports she went and saw her primary care provider initially for the knee pain and he was concerned about a DVT.  She went to [facility], had an ultrasound of the left lower extremity that was negative for DVT.  She reports the pain has continued.  She is taking ibuprofen  at 1000 mg every 6 hours for pain and is typically taking the edge off of the pain.  It is not completely taking the pain away.  Reports pain is worse after activity.  Denies sensation of giving way, locking, popping, or noises coming from the knee.  Reports she has always had left knee pain but it has recently gotten a lot worse.  She feels like it is a little bit swollen.  No redness, fever, nausea/vomiting.    Past Medical History:  Diagnosis Date   High cholesterol    Kidney stones    Miscarriage    Normal delivery 10/27/2012    Patient Active Problem List   Diagnosis Date Noted   Encounter for gynecological examination with Papanicolaou smear of cervix 03/21/2022   Hematuria 03/21/2022   Mixed stress and urge urinary incontinence 03/21/2022   Dyspareunia in female 03/29/2019   Screening examination for STD (sexually transmitted disease) 03/29/2019   Vaginal itching 03/29/2019   Umbilical hernia reducible 03/23/2018   Flu 11/30/2017   Supervision of normal pregnancy 07/01/2017   Spontaneous abortion without complication 12/22/2016   Rubella non-immune status, antepartum 02/05/2015   Sterilization consult 08/22/2014    Past Surgical History:  Procedure Laterality Date   CYSTOSCOPY WITH RETROGRADE PYELOGRAM, URETEROSCOPY AND STENT PLACEMENT Left 04/17/2020   Procedure: CYSTOSCOPY WITH RETROGRADE  PYELOGRAM, URETEROSCOPY AND STENT PLACEMENT, LASER LITHOTRIPSY, STONE EXTRACTION AND DOUBLE j STENT PLACEMENT;  Surgeon: Trent Frizzle, MD;  Location: [facility];  Service: Urology;  Laterality: Left;   NO PAST SURGERIES     TUBAL LIGATION Bilateral 03/23/2018   Procedure: BILATERAL TUBAL LIGATION WITH FALLOPE RINGS;  Surgeon: Albino Hum, MD;  Location: [facility];  Service: Gynecology;  Laterality: Bilateral;   UMBILICAL HERNIA REPAIR N/A 03/23/2018   Procedure: HERNIA REPAIR UMBILICAL ADULT;  Surgeon: Albino Hum, MD;  Location: [facility];  Service: Gynecology;  Laterality: N/A;    OB History     Gravida  5   Para  4   Term  4   Preterm      AB  1   Living  4      SAB  1   IAB      Ectopic      Multiple  0   Live Births  4            Home Medications    Prior to Admission medications   Medication Sig Start Date End Date Taking? Authorizing Provider  celecoxib (CELEBREX) 200 MG capsule Take 1 capsule (200 mg total) by mouth 2 (two) times daily as needed for moderate pain (pain score 4-6). 03/21/24  Yes Wilhemena Harbour, NP  cyclobenzaprine (FLEXERIL) 10 MG tablet Take 10 mg by mouth every 8 (eight) hours as needed. 02/07/22   [provider]  escitalopram (LEXAPRO) 20 MG tablet Take 20 mg by mouth daily. 03/29/20   [provider]  phentermine (ADIPEX-P) 37.5 MG tablet Take 37.5 mg by mouth every morning. 03/08/22   [provider]  rosuvastatin (CRESTOR) 10 MG tablet Take 10 mg by mouth daily. 03/14/22   [provider]  SUMAtriptan (IMITREX) 50 MG tablet TAKE 1 TO 2 TABELTS (50MG ) BY MOUTH AFTER ONSET OF MIGRAINE. MAY REPEAT AFTER 2 HOURS IF HEADACHE RETURNS, NOT TO EXCEED 200MG  IN 24 HOURS. 03/29/20   [provider]    Family History Family History  Problem Relation Age of Onset   Cancer Other        leukemia, breast-maternal great grandma   Hypertension Mother    Diabetes Maternal Grandfather    Coronary artery  disease Maternal Grandfather    Heart attack Maternal Grandfather    Stroke Maternal Grandfather    Hyperlipidemia Father    Heart disease Father        MI 12/2014   Heart attack Father    Other Daughter        tubes in ears   Other Daughter        tubes in ears   Hypercholesterolemia Paternal Aunt     Social History Social History   Tobacco Use   Smoking status: Never   Smokeless tobacco: Never  Vaping Use   Vaping status: Never Used  Substance Use Topics   Alcohol use: Yes    Comment: occ   Drug use: No     Allergies   Patient has no known allergies.   Review of Systems Review of Systems Per HPI  Physical Exam Triage Vital Signs ED Triage Vitals  Encounter Vitals Group     BP 03/21/24 1723 106/73     Systolic BP Percentile --      Diastolic BP Percentile --      Pulse Rate 03/21/24 1723 91     Resp 03/21/24 1723 18     Temp 03/21/24 1723 98 F (36.7 C)     Temp Source 03/21/24 1723 Oral     SpO2 03/21/24 1723 97 %     Weight --      Height --      Head Circumference --      Peak Flow --      Pain Score 03/21/24 1725 6     Pain Loc --      Pain Education --      Exclude from Growth Chart --    No data found.  Updated Vital Signs BP 106/73 (BP Location: Right Arm)   Pulse 91   Temp 98 F (36.7 C) (Oral)   Resp 18   SpO2 97%   Visual Acuity Right Eye Distance:   Left Eye Distance:   Bilateral Distance:    Right Eye Near:   Left Eye Near:    Bilateral Near:     Physical Exam Vitals and nursing note reviewed.  Constitutional:      General: She is not in acute distress.    Appearance: Normal appearance. She is not toxic-appearing.  HENT:     Mouth/Throat:     Mouth: Mucous membranes are moist.     Pharynx: Oropharynx is clear.  Pulmonary:     Effort: Pulmonary effort is normal. No respiratory distress.  Musculoskeletal:     Comments: Inspection: no swelling, bruising, obvious deformity or redness to bilateral knees Palpation:  tender to palpation  left knee superiorly and along medial joint line; no obvious deformities palpated ROM: Full ROM to left knee; there is pain with flexion and extension of the knee; no laxity appreciated Strength: 5/5 bilateral lower extremities Neurovascular: neurovascularly intact in distal bilateral lower extremities  Skin:    General: Skin is warm and dry.     Capillary Refill: Capillary refill takes less than 2 seconds.     Coloration: Skin is not jaundiced or pale.     Findings: No erythema.  Neurological:     Mental Status: She is alert and oriented to person, place, and time.  Psychiatric:        Behavior: Behavior is cooperative.      UC Treatments / Results  Labs (all labs ordered are listed, but only abnormal results are displayed) Labs Reviewed - No data to display  EKG   Radiology No results found.  Procedures Procedures (including critical care time)  Medications Ordered in UC Medications - No data to display  Initial Impression / Assessment and Plan / UC Course  I have reviewed the triage vital signs and the nursing notes.  Pertinent labs & imaging results that were available during my care of the patient were reviewed by me and considered in my medical decision making (see chart for details).   Patient is well-appearing, normotensive, afebrile, not tachycardic, not tachypneic, oxygenating well on room air.   1. Knee strain, left, initial encounter No red flags Suspect knee strain Stop ibuprofen , start Celebrex 200 mg twice daily as she is on Lexapro and there is increased bleeding risk with NSAIDs Also recommended Ace wrap, ice, rest, elevation Follow-up with Ortho if symptoms are not significantly improved with treatment and contact information provided  The patient was given the opportunity to ask questions.  All questions answered to their satisfaction.  The patient is in agreement to this plan.   Final Clinical Impressions(s) / UC Diagnoses    Final diagnoses:  Knee strain, left, initial encounter   Discharge Instructions      Stop ibuprofen  and start taking the Celebrex I have sent to the pharmacy to help with pain and inflammation in your knee.   You can take the Celebrex up to twice daily as needed. Avoid other NSAIDs.  You can take Tylenol  [phone] mg every 6 hours for pain and use the ace wrap to help provide support and compression.  Additionally, recommend applying ice when sitting down to help with pain.   If pain is not much improved after a few days, recommend follow up with Orthopedist; contact information has been provided.  ED Prescriptions     Medication Sig Dispense Auth. Provider   celecoxib (CELEBREX) 200 MG capsule Take 1 capsule (200 mg total) by mouth 2 (two) times daily as needed for moderate pain (pain score 4-6). 60 capsule Wilhemena Harbour, NP      PDMP not reviewed this encounter.   Wilhemena Harbour, NP 03/21/24 1743

## 2024-03-25 ENCOUNTER — Ambulatory Visit: Admitting: Family Medicine

## 2024-03-25 ENCOUNTER — Ambulatory Visit
Admission: RE | Admit: 2024-03-25 | Discharge: 2024-03-25 | Disposition: A | Discharge status: Home / Self Care | Source: Ambulatory Visit | Attending: Family Medicine | Admitting: Family Medicine

## 2024-03-25 ENCOUNTER — Ambulatory Visit: Payer: Self-pay

## 2024-03-25 ENCOUNTER — Other Ambulatory Visit: Payer: Self-pay | Admitting: Family Medicine

## 2024-03-25 VITALS — BP 114/67 | Ht 64.0 in | Wt 206.0 lb

## 2024-03-25 DIAGNOSIS — G8929 Other chronic pain: Secondary | ICD-10-CM

## 2024-03-25 DIAGNOSIS — M25562 Pain in left knee: Secondary | ICD-10-CM | POA: Diagnosis not present

## 2024-03-25 MED ORDER — NITROGLYCERIN 0.2 MG/HR TD PT24: MEDICATED_PATCH | TRANSDERMAL | 1 refills | Status: AC

## 2024-03-25 NOTE — Patient Instructions (Signed)

## 2024-03-25 NOTE — Addendum Note (Signed)
 Addended by: Violetta Grice L on: 03/25/2024 04:34 PM   Modules accepted: Level of Service

## 2024-03-25 NOTE — Progress Notes (Signed)
 PCP: Minus Amel, MD  SUBJECTIVE:   HPI:  Patient is a 33 y.o. female here with chief complaint of left knee pain.  This is an acute on chronic issue. She reports 3 weeks of severe left knee pain.  She locates the pain to the anterior aspect of the knee.  She does state that prior to the past 3 weeks she had been having intermittent knee pain anteriorly and medially over the past year.  Denies any inciting injury or trauma.  No previous knee surgeries.  She has been treating the pain with Tylenol  1000 mg 3 times daily, icing, and elevation.  She is also had some swelling on the left lower extremity over the past 3 weeks that is gravity dependent improves with elevation.  She had a negative DVT ultrasound performed through her PCPs office.  She has not tried any anti-inflammatory medications.  She notes that the pain gets worse going up and down stairs and is worse after prolonged standing.  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  She does not endorse any instability sensation however does endorse occasional catching, no locking.  Pertinent ROS were reviewed with the patient and found to be negative unless otherwise specified above in HPI.   PERTINENT  PMH / PSH / FH / SH:  Past Medical, Surgical, Social, and Family History Reviewed & Updated in the EMR.  Pertinent findings include:  Migraine headaches  No Known Allergies  OBJECTIVE:  There were no vitals taken for this visit.  PHYSICAL EXAM:  GEN: Alert and Oriented, NAD, comfortable in exam room RESP: Unlabored respirations, symmetric chest rise PSY: normal mood, congruent affect   Left Knee MSK EXAM: No gross deformity, ecchymoses, swelling. TTP along the medial pole of the patella, inferior pole of the patella, down the patella tendon, and mildly across the medial joint line.  Lateral joint line nontender.  Posterior knee is nontender. FROM with normal strength. Negative ant/post drawers. Negative valgus/varus testing.  Negative lachman.  Equivocal mcmurrays, and positive Thessaly. NV intact distally.  Limited MSK Ultrasound of the Left Knee No effusion in the suprapatellar pouch Quadriceps tendons are normal Patellar tendon with hypoechoic swelling though intact tendon fibers.  Lateral meniscus is intact with normal joint line.  Medial meniscus is intact without swelling or bulging, joint line is not narrowed.  Impression: Patellar tendinitis with normal appearing medial joint line and meniscus U/S performed and interpreted by Lin Rend, MD.  Independent review of left knee x-rays from 03/25/2021 shows normal-appearing joint spaces without significant degenerative changes and no acute bony abnormalities. Assessment & Plan Chronic pain of left knee Acute on chronic left knee pain.  Presentation most consistent with patellar tendinitis and patellofemoral pain syndrome.  She did have some equivocal medial meniscus testing though on ultrasound there did not appear to be any appreciable swelling, damage to the medial meniscus.  Plan: - Updated left knee x-rays ordered - Recommended activity modification, icing, starting the Celebrex provided by urgent care, and compression. - Home exercise program provided for declined squats, isometric quadricep extension exercises, and quadricep strengthening for PFPS - Did also discuss options including nitroglycerin topical patches given complaint of patellar tendinitis.  We discussed appropriate use as well as common side effects including migraines and skin irritation, she does have a history of migraines though would like to try this. - Will follow-up in 6 weeks   Lin Rend, MD PGY-4, Sports Medicine Fellow [facility]

## 2024-03-25 NOTE — Progress Notes (Signed)
 [facility]: Attending Note: I have examined the patient, reviewed the chart, discussed the assessment and plan with the Sports Medicine Fellow. I agree with assessment and treatment plan as detailed in the Fellow's note. Knee pain seems most consistent with patellofemoral syndrome.  Her kneecap alignment is a little lateralized on both sides.  Very impressive patellar grind test.  She does have some mild patellar tendon tenderness to palpation and the ultrasound reveals some mild tendinopathy type changes.  Agree with home exercise program, will try the nitroglycerin patch therapy.  Have discussed with her that this may increase or cause recurrence of migraines and if so she should stop that.

## 2024-03-30 ENCOUNTER — Ambulatory Visit: Payer: Self-pay | Admitting: Family Medicine

## 2024-05-02 ENCOUNTER — Other Ambulatory Visit (HOSPITAL_COMMUNITY)
Admission: RE | Admit: 2024-05-02 | Discharge: 2024-05-02 | Disposition: A | Discharge status: Home / Self Care | Payer: Self-pay | Source: Ambulatory Visit | Attending: Family Medicine | Admitting: Family Medicine

## 2024-05-02 DIAGNOSIS — H8110 Benign paroxysmal vertigo, unspecified ear: Secondary | ICD-10-CM | POA: Insufficient documentation

## 2024-05-02 LAB — COMPREHENSIVE METABOLIC PANEL WITH GFR
ALT: 13 U/L (ref 0–44)
AST: 14 U/L — ABNORMAL LOW (ref 15–41)
Albumin: 4.2 g/dL (ref 3.5–5.0)
Alkaline Phosphatase: 52 U/L (ref 38–126)
Anion gap: 8 (ref 5–15)
BUN: 10 mg/dL (ref 6–20)
CO2: 24 mmol/L (ref 22–32)
Calcium: 8.6 mg/dL — ABNORMAL LOW (ref 8.9–10.3)
Chloride: 104 mmol/L (ref 98–111)
Creatinine, Ser: 0.75 mg/dL (ref 0.44–1.00)
GFR, Estimated: >60 mL/min (ref >60)
Glucose, Bld: 86 mg/dL (ref 70–99)
Potassium: 3.9 mmol/L (ref 3.5–5.1)
Sodium: 136 mmol/L (ref 135–145)
Total Bilirubin: 0.6 mg/dL (ref 0.0–1.2)
Total Protein: 7.4 g/dL (ref 6.5–8.1)

## 2024-05-02 LAB — CBC WITH DIFFERENTIAL/PLATELET
Abs Immature Granulocytes: 0.01 K/uL (ref 0.00–0.07)
Basophils Absolute: 0 K/uL (ref 0.0–0.1)
Basophils Relative: 1 %
Eosinophils Absolute: 0 K/uL (ref 0.0–0.5)
Eosinophils Relative: 1 %
HCT: 39.2 % (ref 36.0–46.0)
Hemoglobin: 13.7 g/dL (ref 12.0–15.0)
Immature Granulocytes: 0 %
Lymphocytes Relative: 26 %
Lymphs Abs: 1.1 K/uL (ref 0.7–4.0)
MCH: 31.6 pg (ref 26.0–34.0)
MCHC: 34.9 g/dL (ref 30.0–36.0)
MCV: 90.5 fL (ref 80.0–100.0)
Monocytes Absolute: 0.3 K/uL (ref 0.1–1.0)
Monocytes Relative: 8 %
Neutro Abs: 2.8 K/uL (ref 1.7–7.7)
Neutrophils Relative %: 64 %
Platelets: 228 K/uL (ref 150–400)
RBC: 4.33 MIL/uL (ref 3.87–5.11)
RDW: 12.8 % (ref 11.5–15.5)
WBC: 4.3 K/uL (ref 4.0–10.5)
nRBC: 0 % (ref 0.0–0.2)

## 2024-05-02 LAB — VITAMIN D 25 HYDROXY (VIT D DEFICIENCY, FRACTURES): Vit D, 25-Hydroxy: 31.69 ng/mL (ref 30–100)

## 2024-05-02 LAB — TSH: TSH: 1.4 u[IU]/mL (ref 0.350–4.500)

## 2024-05-05 ENCOUNTER — Other Ambulatory Visit (HOSPITAL_COMMUNITY)
Admission: RE | Admit: 2024-05-05 | Discharge: 2024-05-05 | Disposition: A | Discharge status: Home / Self Care | Payer: Self-pay | Source: Ambulatory Visit | Attending: Family Medicine | Admitting: Family Medicine

## 2024-05-06 LAB — CALCIUM, IONIZED: Calcium, Ionized, Serum: 4.9 mg/dL (ref 4.5–5.6)

## 2024-05-12 ENCOUNTER — Other Ambulatory Visit: Payer: Self-pay | Admitting: Family Medicine

## 2024-05-12 DIAGNOSIS — H8110 Benign paroxysmal vertigo, unspecified ear: Secondary | ICD-10-CM

## 2024-05-17 ENCOUNTER — Encounter: Payer: Self-pay | Admitting: Family Medicine

## 2024-05-18 ENCOUNTER — Encounter: Payer: Self-pay | Admitting: Family Medicine

## 2024-05-27 ENCOUNTER — Encounter: Payer: Self-pay | Admitting: Family Medicine

## 2024-05-30 ENCOUNTER — Encounter: Payer: Self-pay | Admitting: Family Medicine

## 2024-05-31 ENCOUNTER — Encounter: Payer: Self-pay | Admitting: Family Medicine

## 2024-06-02 ENCOUNTER — Ambulatory Visit
Admission: RE | Admit: 2024-06-02 | Discharge: 2024-06-02 | Disposition: A | Discharge status: Home / Self Care | Payer: Self-pay | Source: Ambulatory Visit | Attending: Family Medicine | Admitting: Family Medicine

## 2024-06-02 DIAGNOSIS — H8110 Benign paroxysmal vertigo, unspecified ear: Secondary | ICD-10-CM

## 2024-08-11 ENCOUNTER — Ambulatory Visit: Admission: EM | Admit: 2024-08-11 | Discharge: 2024-08-11 | Disposition: A | Discharge status: Home / Self Care

## 2024-08-11 DIAGNOSIS — J069 Acute upper respiratory infection, unspecified: Secondary | ICD-10-CM | POA: Diagnosis not present

## 2024-08-11 LAB — POCT RAPID STREP A (OFFICE): Rapid Strep A Screen: NEGATIVE

## 2024-08-11 NOTE — ED Triage Notes (Signed)
 Pt states sore throat,cough and fatigue for the past 3 days.  States she hasn't taken anything at home for her symptoms.

## 2024-08-11 NOTE — ED Provider Notes (Signed)
 [facility]    CSN: [id number] Arrival date & time: 08/11/24  1544      History   Chief Complaint Chief Complaint  Patient presents with   Sore Throat    HPI Jessica Ward is a 33 y.o. female.   Patient presents today with 3-day history of cough, chest tightness, nasal congestion, sore throat, headache, left ear pressure, decreased appetite, and fatigue.  No fever, body aches or chills, shortness of breath, runny nose, abdominal pain, nausea/vomiting, or diarrhea.  Her son is sick with similar symptoms.  Has not taken it did anything for symptoms so far.    Past Medical History:  Diagnosis Date   High cholesterol    Kidney stones    Miscarriage    Normal delivery 10/27/2012    Patient Active Problem List   Diagnosis Date Noted   Encounter for gynecological examination with Papanicolaou smear of cervix 03/21/2022   Hematuria 03/21/2022   Mixed stress and urge urinary incontinence 03/21/2022   Dyspareunia in female 03/29/2019   Screening examination for STD (sexually transmitted disease) 03/29/2019   Vaginal itching 03/29/2019   Umbilical hernia reducible 03/23/2018   Flu 11/30/2017   Supervision of normal pregnancy 07/01/2017   Spontaneous abortion without complication 12/22/2016   Rubella non-immune status, antepartum 02/05/2015   Sterilization consult 08/22/2014    Past Surgical History:  Procedure Laterality Date   CYSTOSCOPY WITH RETROGRADE PYELOGRAM, URETEROSCOPY AND STENT PLACEMENT Left 04/17/2020   Procedure: CYSTOSCOPY WITH RETROGRADE PYELOGRAM, URETEROSCOPY AND STENT PLACEMENT, LASER LITHOTRIPSY, STONE EXTRACTION AND DOUBLE j STENT PLACEMENT;  Surgeon: Matilda Senior, MD;  Location: [facility];  Service: Urology;  Laterality: Left;   NO PAST SURGERIES     TUBAL LIGATION Bilateral 03/23/2018   Procedure: BILATERAL TUBAL LIGATION WITH FALLOPE RINGS;  Surgeon: Edsel Norleen GAILS, MD;  Location: [facility];  Service: Gynecology;  Laterality: Bilateral;    UMBILICAL HERNIA REPAIR N/A 03/23/2018   Procedure: HERNIA REPAIR UMBILICAL ADULT;  Surgeon: Edsel Norleen GAILS, MD;  Location: [facility];  Service: Gynecology;  Laterality: N/A;    OB History     Gravida  5   Para  4   Term  4   Preterm      AB  1   Living  4      SAB  1   IAB      Ectopic      Multiple  0   Live Births  4            Home Medications    Prior to Admission medications   Medication Sig Start Date End Date Taking? Authorizing Provider  buPROPion (WELLBUTRIN XL) 300 MG 24 hr tablet Take 300 mg by mouth. 07/13/24  Yes [provider]  celecoxib  (CELEBREX ) 200 MG capsule Take 1 capsule (200 mg total) by mouth 2 (two) times daily as needed for moderate pain (pain score 4-6). 03/21/24   Chandra Harlene LABOR, NP  cyclobenzaprine (FLEXERIL) 10 MG tablet Take 10 mg by mouth every 8 (eight) hours as needed. 02/07/22   [provider]  escitalopram (LEXAPRO) 20 MG tablet Take 20 mg by mouth daily. 03/29/20   [provider]  nitroGLYCERIN  (NITRODUR - DOSED IN MG/24 HR) 0.2 mg/hr patch Use 1/4 patch daily to the affected area. 03/25/24   Rosalynn Camie CROME, MD  phentermine (ADIPEX-P) 37.5 MG tablet Take 37.5 mg by mouth every morning. 03/08/22   [provider]  rosuvastatin (CRESTOR) 10 MG tablet Take  10 mg by mouth daily. 03/14/22   [provider]  SUMAtriptan (IMITREX) 50 MG tablet TAKE 1 TO 2 TABELTS (50MG ) BY MOUTH AFTER ONSET OF MIGRAINE. MAY REPEAT AFTER 2 HOURS IF HEADACHE RETURNS, NOT TO EXCEED 200MG  IN 24 HOURS. 03/29/20   [provider]    Family History Family History  Problem Relation Age of Onset   Cancer Other        leukemia, breast-maternal great grandma   Hypertension Mother    Diabetes Maternal Grandfather    Coronary artery disease Maternal Grandfather    Heart attack Maternal Grandfather    Stroke Maternal Grandfather    Hyperlipidemia Father    Heart disease Father        MI 12/2014   Heart attack  Father    Other Daughter        tubes in ears   Other Daughter        tubes in ears   Hypercholesterolemia Paternal Aunt     Social History Social History   Tobacco Use   Smoking status: Never   Smokeless tobacco: Never  Vaping Use   Vaping status: Never Used  Substance Use Topics   Alcohol use: Yes    Comment: occ   Drug use: No     Allergies   Patient has no known allergies.   Review of Systems Review of Systems Per HPI  Physical Exam Triage Vital Signs ED Triage Vitals  Encounter Vitals Group     BP 08/11/24 1605 104/71     Girls Systolic BP Percentile --      Girls Diastolic BP Percentile --      Boys Systolic BP Percentile --      Boys Diastolic BP Percentile --      Pulse Rate 08/11/24 1605 (!) 105     Resp 08/11/24 1605 16     Temp 08/11/24 1605 97.6 F (36.4 C)     Temp Source 08/11/24 1605 Oral     SpO2 08/11/24 1605 98 %     Weight --      Height --      Head Circumference --      Peak Flow --      Pain Score 08/11/24 1603 6     Pain Loc --      Pain Education --      Exclude from Growth Chart --    No data found.  Updated Vital Signs BP 104/71 (BP Location: Right Arm)   Pulse (!) 105   Temp 97.6 F (36.4 C) (Oral)   Resp 16   LMP 08/04/2024 (Approximate)   SpO2 98%   Visual Acuity Right Eye Distance:   Left Eye Distance:   Bilateral Distance:    Right Eye Near:   Left Eye Near:    Bilateral Near:     Physical Exam Vitals and nursing note reviewed.  Constitutional:      General: She is not in acute distress.    Appearance: Normal appearance. She is not ill-appearing or toxic-appearing.  HENT:     Head: Normocephalic and atraumatic.     Right Ear: Tympanic membrane, ear canal and external ear normal. No drainage, swelling or tenderness. No middle ear effusion. Tympanic membrane is not erythematous.     Left Ear: Ear canal and external ear normal. A middle ear effusion is present.     Nose: No congestion or rhinorrhea.      Mouth/Throat:     Mouth:  Mucous membranes are moist.     Pharynx: Oropharynx is clear. Postnasal drip present. No oropharyngeal exudate or posterior oropharyngeal erythema.  Eyes:     General: No scleral icterus.    Extraocular Movements: Extraocular movements intact.  Cardiovascular:     Rate and Rhythm: Normal rate and regular rhythm.  Pulmonary:     Effort: Pulmonary effort is normal. No respiratory distress.     Breath sounds: Normal breath sounds. No wheezing, rhonchi or rales.  Musculoskeletal:     Cervical back: Normal range of motion and neck supple.  Lymphadenopathy:     Cervical: No cervical adenopathy.  Skin:    General: Skin is warm and dry.     Coloration: Skin is not jaundiced or pale.     Findings: No erythema or rash.  Neurological:     Mental Status: She is alert and oriented to person, place, and time.  Psychiatric:        Behavior: Behavior is cooperative.      UC Treatments / Results  Labs (all labs ordered are listed, but only abnormal results are displayed) Labs Reviewed  POCT RAPID STREP A (OFFICE)    EKG   Radiology No results found.  Procedures Procedures (including critical care time)  Medications Ordered in UC Medications - No data to display  Initial Impression / Assessment and Plan / UC Course  I have reviewed the triage vital signs and the nursing notes.  Pertinent labs & imaging results that were available during my care of the patient were reviewed by me and considered in my medical decision making (see chart for details).   Patient is mildly tachycardic in triage today, otherwise vital signs are stable.  1. Viral URI with cough Vitals and exam are reassuring today Rapid strep test is negative Viral testing deferred Supportive care discussed with patient ER and return precautions discussed Work excuse provided  The patient was given the opportunity to ask questions.  All questions answered to their satisfaction.  The patient  is in agreement to this plan.   Final Clinical Impressions(s) / UC Diagnoses   Final diagnoses:  Viral URI with cough     Discharge Instructions      You have a viral upper respiratory infection.  Symptoms should improve over the next week to 10 days.  If you develop chest pain or shortness of breath, go to the emergency room.  Rapid strep throat test is negative today.  Some things that can make you feel better are: - Increased rest - Increasing fluid with water /sugar free electrolytes - Acetaminophen  and ibuprofen  as needed for fever/pain - Salt water  gargling, chloraseptic spray and throat lozenges - OTC guaifenesin (Mucinex) 600 mg twice daily - Saline sinus flushes or a neti pot - Humidifying the air     ED Prescriptions   None    PDMP not reviewed this encounter.   Chandra Harlene LABOR, NP 08/11/24 [phone]

## 2024-08-11 NOTE — Discharge Instructions (Signed)
 You have a viral upper respiratory infection.  Symptoms should improve over the next week to 10 days.  If you develop chest pain or shortness of breath, go to the emergency room.  Rapid strep throat test is negative today.  Some things that can make you feel better are: - Increased rest - Increasing fluid with water /sugar free electrolytes - Acetaminophen  and ibuprofen  as needed for fever/pain - Salt water  gargling, chloraseptic spray and throat lozenges - OTC guaifenesin (Mucinex) 600 mg twice daily - Saline sinus flushes or a neti pot - Humidifying the air
